# Patient Record
Sex: Female | Born: 1945 | Race: White | Hispanic: No | Smoking: Never smoker
Health system: Southern US, Community
[De-identification: ages and names within clinical notes are randomized; demographics above are authoritative.]

## PROBLEM LIST (undated history)

## (undated) DIAGNOSIS — M1990 Unspecified osteoarthritis, unspecified site: Secondary | ICD-10-CM

## (undated) DIAGNOSIS — F419 Anxiety disorder, unspecified: Secondary | ICD-10-CM

## (undated) DIAGNOSIS — Z8619 Personal history of other infectious and parasitic diseases: Secondary | ICD-10-CM

## (undated) DIAGNOSIS — Z8669 Personal history of other diseases of the nervous system and sense organs: Secondary | ICD-10-CM

## (undated) DIAGNOSIS — M810 Age-related osteoporosis without current pathological fracture: Secondary | ICD-10-CM

## (undated) DIAGNOSIS — A6920 Lyme disease, unspecified: Secondary | ICD-10-CM

## (undated) HISTORY — DX: Lyme disease, unspecified: A69.20

## (undated) HISTORY — PX: ABDOMINAL HYSTERECTOMY: SHX81

## (undated) HISTORY — DX: Personal history of other diseases of the nervous system and sense organs: Z86.69

## (undated) HISTORY — PX: APPENDECTOMY: SHX54

## (undated) HISTORY — DX: Personal history of other infectious and parasitic diseases: Z86.19

## (undated) HISTORY — PX: TONSILLECTOMY: SUR1361

## (undated) HISTORY — DX: Anxiety disorder, unspecified: F41.9

## (undated) HISTORY — DX: Unspecified osteoarthritis, unspecified site: M19.90

## (undated) HISTORY — DX: Age-related osteoporosis without current pathological fracture: M81.0

---

## 2015-01-15 DIAGNOSIS — M47816 Spondylosis without myelopathy or radiculopathy, lumbar region: Secondary | ICD-10-CM | POA: Insufficient documentation

## 2015-01-26 ENCOUNTER — Ambulatory Visit: Payer: Self-pay | Admitting: Family Medicine

## 2015-04-30 ENCOUNTER — Ambulatory Visit
Admission: RE | Admit: 2015-04-30 | Discharge: 2015-04-30 | Disposition: A | Discharge status: Home / Self Care | Payer: Medicare Other | Source: Ambulatory Visit | Attending: Gastroenterology | Admitting: Gastroenterology

## 2015-04-30 ENCOUNTER — Encounter
Admission: RE | Disposition: A | Discharge status: Home / Self Care | Payer: Self-pay | Source: Ambulatory Visit | Attending: Gastroenterology

## 2015-04-30 ENCOUNTER — Encounter: Payer: Self-pay | Admitting: *Deleted

## 2015-04-30 ENCOUNTER — Ambulatory Visit: Payer: Medicare Other | Admitting: Anesthesiology

## 2015-04-30 DIAGNOSIS — Z1211 Encounter for screening for malignant neoplasm of colon: Secondary | ICD-10-CM | POA: Diagnosis present

## 2015-04-30 DIAGNOSIS — D120 Benign neoplasm of cecum: Secondary | ICD-10-CM | POA: Diagnosis not present

## 2015-04-30 HISTORY — PX: COLONOSCOPY: SHX5424

## 2015-04-30 SURGERY — COLONOSCOPY
Anesthesia: General

## 2015-04-30 MED ORDER — MIDAZOLAM HCL 2 MG/2ML IJ SOLN
INTRAMUSCULAR | Status: DC | PRN
  Administered 2015-04-30: 1 mg via INTRAVENOUS

## 2015-04-30 MED ORDER — PROPOFOL INFUSION 10 MG/ML OPTIME
INTRAVENOUS | Status: DC | PRN
  Administered 2015-04-30: 100 ug/kg/min via INTRAVENOUS

## 2015-04-30 MED ORDER — PROPOFOL 10 MG/ML IV BOLUS
INTRAVENOUS | Status: DC | PRN
  Administered 2015-04-30: 20 mg via INTRAVENOUS

## 2015-04-30 MED ORDER — FENTANYL CITRATE (PF) 100 MCG/2ML IJ SOLN
INTRAMUSCULAR | Status: DC | PRN
  Administered 2015-04-30: 50 ug via INTRAVENOUS

## 2015-04-30 MED ORDER — SODIUM CHLORIDE 0.9 % IV SOLN
INTRAVENOUS | Status: DC
  Administered 2015-04-30: 12:00:00 via INTRAVENOUS
  Administered 2015-04-30: 1000 mL via INTRAVENOUS

## 2015-04-30 NOTE — Discharge Instructions (Signed)

## 2015-04-30 NOTE — Transfer of Care (Signed)
Immediate Anesthesia Transfer of Care Note  Patient: Evelyn Barker  Procedure(s) Performed: Procedure(s): COLONOSCOPY (N/A)  Patient Location: PACU  Anesthesia Type:General  Level of Consciousness: awake, alert  and oriented  Airway & Oxygen Therapy: Patient Spontanous Breathing and Patient connected to nasal cannula oxygen  Post-op Assessment: Report given to RN and Post -op Vital signs reviewed and stable  Post vital signs: Reviewed and stable  Last Vitals:  Filed Vitals:   04/30/15 1236  BP: 116/52  Pulse: 63  Temp: 36 C  Resp:     Complications: No apparent anesthesia complications

## 2015-04-30 NOTE — H&P (Signed)
  Primary Care Physician:  Maryland Pink, MD  Pre-Procedure History & Physical: HPI:  Evelyn Barker is a 69 y.o. female is here for a colonoscopy.   History reviewed. No pertinent past medical history.  Past Surgical History  Procedure Laterality Date  . Appendectomy    . Abdominal hysterectomy      Prior to Admission medications   Medication Sig Start Date End Date Taking? Authorizing Provider  aspirin 325 MG tablet Take 325 mg by mouth every 6 (six) hours as needed for headache.   Yes Historical Provider, MD  ibuprofen (ADVIL,MOTRIN) 200 MG tablet Take 200 mg by mouth 2 (two) times daily as needed for moderate pain.   Yes Historical Provider, MD  Multiple Vitamin (MULTIVITAMIN WITH MINERALS) TABS tablet Take 1 tablet by mouth every other day.   Yes Historical Provider, MD  multivitamin-lutein (OCUVITE-LUTEIN) CAPS capsule Take 1 capsule by mouth every other day.   Yes Historical Provider, MD    Allergies as of 03/25/2015  . (Not on File)    History reviewed. No pertinent family history.  History   Social History  . Marital Status: Widowed    Spouse Name: N/A  . Number of Children: N/A  . Years of Education: N/A   Occupational History  . Not on file.   Social History Main Topics  . Smoking status: Never Smoker   . Smokeless tobacco: Not on file  . Alcohol Use: Not on file  . Drug Use: Not on file  . Sexual Activity: Not on file   Other Topics Concern  . Not on file   Social History Narrative  . No narrative on file     Physical Exam: BP 122/95 mmHg  Pulse 70  Temp(Src) 96.9 F (36.1 C) (Tympanic)  Resp 16  Ht 5\' 5"  (1.651 m)  Wt 117 lb (53.071 kg)  BMI 19.47 kg/m2  SpO2 100% General:   Alert,  pleasant and cooperative in NAD Head:  Normocephalic and atraumatic. Neck:  Supple; no masses or thyromegaly. Lungs:  Clear throughout to auscultation.    Heart:  Regular rate and rhythm. Abdomen:  Soft, nontender and nondistended. Normal bowel sounds, without  guarding, and without rebound.   Neurologic:  Alert and  oriented x4;  grossly normal neurologically.  Impression/Plan: Evelyn Barker is here for an colonoscopy to be performed for colon cancer prevention  Risks, benefits, limitations, and alternatives regarding  colonoscopy have been reviewed with the patient.  Questions have been answered.  All parties agreeable.   Josefine Class, MD  04/30/2015, 11:53 AM

## 2015-04-30 NOTE — Op Note (Signed)
[facility] Patient Name: Evelyn Barker Procedure Date: 04/30/2015 11:25 AM MRN: [id number] Account #: [id number] Date of Birth: [birth year] Admit Type: Outpatient Age: 69 Room: [facility] ENDO ROOM 1 Gender: Female Note Status: Finalized Procedure:         Colonoscopy Indications:       Screening for colorectal malignant neoplasm, Last                     colonoscopy 15 years ago Patient Profile:   This is a 69 year old female. Providers:         Gerrit Heck. Rayann Heman, MD Referring MD:      Irven Easterly. Kary Kos, MD (Referring MD) Medicines:         Propofol per Anesthesia Complications:     No immediate complications. Procedure:         Pre-Anesthesia Assessment:                    - Prior to the procedure, a History and Physical was                     performed, and patient medications and allergies were                     reviewed. The patient is competent. The risks and benefits                     of the procedure and the sedation options and risks were                     discussed with the patient. All questions were answered                     and informed consent was obtained. Patient identification                     and proposed procedure were verified by the physician and                     the nurse in the pre-procedure area. Mental Status                     Examination: alert and oriented. Airway Examination:                     normal oropharyngeal airway and neck mobility. Respiratory                     Examination: clear to auscultation. CV Examination: RRR,                     no murmurs, no S3 or S4. Prophylactic Antibiotics: The                     patient does not require prophylactic antibiotics. Prior                     Anticoagulants: The patient has taken no previous                     anticoagulant or antiplatelet agents. ASA Grade                     Assessment: II - A patient with mild systemic disease.  After  reviewing the risks and benefits, the patient was                     deemed in satisfactory condition to undergo the procedure.                     The anesthesia plan was to use monitored anesthesia care                     (MAC). Immediately prior to administration of medications,                     the patient was re-assessed for adequacy to receive                     sedatives. The heart rate, respiratory rate, oxygen                     saturations, blood pressure, adequacy of pulmonary                     ventilation, and response to care were monitored                     throughout the procedure. The physical status of the                     patient was re-assessed after the procedure.                    - Prior to the procedure, a History and Physical was                     performed, and patient medications, allergies and                     sensitivities were reviewed. The patient's tolerance of                     previous anesthesia was reviewed.                    After obtaining informed consent, the colonoscope was                     passed under direct vision. Throughout the procedure, the                     patient's blood pressure, pulse, and oxygen saturations                     were monitored continuously. The Colonoscope was                     introduced through the anus and advanced to the the cecum,                     identified by appendiceal orifice and ileocecal valve. The                     patient tolerated the procedure well. The quality of the                     bowel preparation was excellent. The colonoscopy was  somewhat difficult due to significant looping. Successful                     completion of the procedure was aided by using manual                     pressure. Findings:      The perianal and digital rectal examinations were normal.      A 3 mm polyp was found in the cecum. The polyp was sessile. The polyp        was removed with a jumbo cold forceps. Resection and retrieval were       complete.      The exam was otherwise without abnormality on direct and retroflexion       views. Impression:        - One 3 mm polyp in the cecum. Resected and retrieved.                    - The examination was otherwise normal on direct and                     retroflexion views. Recommendation:    - Observe patient in GI recovery unit.                    - High fiber diet.                    - Continue present medications.                    - Await pathology results.                    - Repeat colonoscopy for surveillance based on pathology                     results.                    - Return to referring physician.                    - The findings and recommendations were discussed with the                     patient.                    - The findings and recommendations were discussed with the                     patient's family. Procedure Code(s): --- Professional ---                    [phone], Colonoscopy, flexible; with biopsy, single or                     multiple CPT copyright 2014 American Medical Association. All rights reserved. The codes documented in this report are preliminary and upon coder review may  be revised to meet current compliance requirements. Mellody Life, MD 04/30/2015 12:31:28 PM This report has been signed electronically. Number of Addenda: 0 Note Initiated On: 04/30/2015 11:25 AM Scope Withdrawal Time: 0 hours 1 minute 49 seconds  Total Procedure Duration: 0 hours 16 minutes 30 seconds       [facility]

## 2015-04-30 NOTE — Anesthesia Preprocedure Evaluation (Signed)
Anesthesia Evaluation  Patient identified by MRN, date of birth, ID band Patient awake    Reviewed: Allergy & Precautions, H&P , NPO status , Patient's Chart, lab work & pertinent test results, reviewed documented beta blocker date and time   Airway Mallampati: II  TM Distance: >3 FB Neck ROM: full    Dental no notable dental hx.    Pulmonary neg pulmonary ROS,  breath sounds clear to auscultation  Pulmonary exam normal       Cardiovascular Exercise Tolerance: Good negative cardio ROS  Rhythm:regular Rate:Normal     Neuro/Psych negative neurological ROS  negative psych ROS   GI/Hepatic negative GI ROS, Neg liver ROS,   Endo/Other  negative endocrine ROS  Renal/GU negative Renal ROS  negative genitourinary   Musculoskeletal   Abdominal   Peds  Hematology negative hematology ROS (+)   Anesthesia Other Findings   Reproductive/Obstetrics negative OB ROS                             Anesthesia Physical Anesthesia Plan  ASA: II  Anesthesia Plan: General   Post-op Pain Management:    Induction:   Airway Management Planned:   Additional Equipment:   Intra-op Plan:   Post-operative Plan:   Informed Consent: I have reviewed the patients History and Physical, chart, labs and discussed the procedure including the risks, benefits and alternatives for the proposed anesthesia with the patient or authorized representative who has indicated his/her understanding and acceptance.   Dental Advisory Given  Plan Discussed with: CRNA  Anesthesia Plan Comments:         Anesthesia Quick Evaluation

## 2015-04-30 NOTE — Anesthesia Postprocedure Evaluation (Signed)
  Anesthesia Post-op Note  Patient: Evelyn Barker  Procedure(s) Performed: Procedure(s): COLONOSCOPY (N/A)  Anesthesia type:General  Patient location: PACU  Post pain: Pain level controlled  Post assessment: Post-op Vital signs reviewed, Patient's Cardiovascular Status Stable, Respiratory Function Stable, Patent Airway and No signs of Nausea or vomiting  Post vital signs: Reviewed and stable  Last Vitals:  Filed Vitals:   04/30/15 1320  BP: 138/62  Pulse:   Temp:   Resp:     Level of consciousness: awake, alert  and patient cooperative  Complications: No apparent anesthesia complications

## 2015-05-03 LAB — SURGICAL PATHOLOGY

## 2015-05-04 ENCOUNTER — Encounter: Payer: Self-pay | Admitting: Gastroenterology

## 2015-12-10 ENCOUNTER — Other Ambulatory Visit: Payer: Self-pay | Admitting: Adult Health

## 2015-12-10 DIAGNOSIS — N644 Mastodynia: Secondary | ICD-10-CM

## 2015-12-10 DIAGNOSIS — Z1231 Encounter for screening mammogram for malignant neoplasm of breast: Secondary | ICD-10-CM

## 2015-12-21 ENCOUNTER — Ambulatory Visit
Admission: RE | Admit: 2015-12-21 | Discharge: 2015-12-21 | Disposition: A | Discharge status: Home / Self Care | Payer: Medicare Other | Source: Ambulatory Visit | Attending: Adult Health | Admitting: Adult Health

## 2015-12-21 DIAGNOSIS — N644 Mastodynia: Secondary | ICD-10-CM

## 2016-10-22 DIAGNOSIS — F419 Anxiety disorder, unspecified: Secondary | ICD-10-CM | POA: Insufficient documentation

## 2017-01-12 ENCOUNTER — Other Ambulatory Visit: Payer: Self-pay | Admitting: Internal Medicine

## 2017-01-12 DIAGNOSIS — Z1231 Encounter for screening mammogram for malignant neoplasm of breast: Secondary | ICD-10-CM

## 2017-01-18 ENCOUNTER — Ambulatory Visit: Payer: Medicare Other

## 2017-02-16 ENCOUNTER — Ambulatory Visit
Admission: RE | Admit: 2017-02-16 | Discharge: 2017-02-16 | Disposition: A | Discharge status: Home / Self Care | Payer: Medicare Other | Source: Ambulatory Visit | Attending: Internal Medicine | Admitting: Internal Medicine

## 2017-02-16 DIAGNOSIS — Z1231 Encounter for screening mammogram for malignant neoplasm of breast: Secondary | ICD-10-CM | POA: Diagnosis not present

## 2017-07-18 DIAGNOSIS — G43101 Migraine with aura, not intractable, with status migrainosus: Secondary | ICD-10-CM | POA: Diagnosis not present

## 2017-10-04 DIAGNOSIS — Z23 Encounter for immunization: Secondary | ICD-10-CM | POA: Diagnosis not present

## 2017-10-26 DIAGNOSIS — M25561 Pain in right knee: Secondary | ICD-10-CM | POA: Diagnosis not present

## 2017-10-26 DIAGNOSIS — M810 Age-related osteoporosis without current pathological fracture: Secondary | ICD-10-CM | POA: Diagnosis not present

## 2017-10-26 DIAGNOSIS — M179 Osteoarthritis of knee, unspecified: Secondary | ICD-10-CM | POA: Diagnosis not present

## 2017-10-26 DIAGNOSIS — M25551 Pain in right hip: Secondary | ICD-10-CM | POA: Diagnosis not present

## 2017-11-14 DIAGNOSIS — M810 Age-related osteoporosis without current pathological fracture: Secondary | ICD-10-CM | POA: Diagnosis not present

## 2017-12-12 DIAGNOSIS — D485 Neoplasm of uncertain behavior of skin: Secondary | ICD-10-CM | POA: Diagnosis not present

## 2017-12-12 DIAGNOSIS — L718 Other rosacea: Secondary | ICD-10-CM | POA: Diagnosis not present

## 2017-12-12 DIAGNOSIS — D229 Melanocytic nevi, unspecified: Secondary | ICD-10-CM | POA: Diagnosis not present

## 2017-12-12 DIAGNOSIS — Z1283 Encounter for screening for malignant neoplasm of skin: Secondary | ICD-10-CM | POA: Diagnosis not present

## 2017-12-12 DIAGNOSIS — I781 Nevus, non-neoplastic: Secondary | ICD-10-CM | POA: Diagnosis not present

## 2017-12-12 DIAGNOSIS — L570 Actinic keratosis: Secondary | ICD-10-CM | POA: Diagnosis not present

## 2017-12-12 DIAGNOSIS — L720 Epidermal cyst: Secondary | ICD-10-CM | POA: Diagnosis not present

## 2017-12-12 DIAGNOSIS — L821 Other seborrheic keratosis: Secondary | ICD-10-CM | POA: Diagnosis not present

## 2017-12-12 DIAGNOSIS — D1800 Hemangioma unspecified site: Secondary | ICD-10-CM | POA: Diagnosis not present

## 2017-12-12 DIAGNOSIS — D2239 Melanocytic nevi of other parts of face: Secondary | ICD-10-CM | POA: Diagnosis not present

## 2017-12-12 DIAGNOSIS — Z85828 Personal history of other malignant neoplasm of skin: Secondary | ICD-10-CM | POA: Diagnosis not present

## 2017-12-12 DIAGNOSIS — L739 Follicular disorder, unspecified: Secondary | ICD-10-CM | POA: Diagnosis not present

## 2017-12-12 DIAGNOSIS — I8393 Asymptomatic varicose veins of bilateral lower extremities: Secondary | ICD-10-CM | POA: Diagnosis not present

## 2018-01-07 DIAGNOSIS — D485 Neoplasm of uncertain behavior of skin: Secondary | ICD-10-CM | POA: Diagnosis not present

## 2018-01-07 DIAGNOSIS — D210 Benign neoplasm of connective and other soft tissue of head, face and neck: Secondary | ICD-10-CM | POA: Diagnosis not present

## 2018-01-14 ENCOUNTER — Other Ambulatory Visit: Payer: Self-pay | Admitting: Internal Medicine

## 2018-01-14 DIAGNOSIS — D239 Other benign neoplasm of skin, unspecified: Secondary | ICD-10-CM | POA: Diagnosis not present

## 2018-01-14 DIAGNOSIS — Z1231 Encounter for screening mammogram for malignant neoplasm of breast: Secondary | ICD-10-CM

## 2018-02-19 ENCOUNTER — Ambulatory Visit
Admission: RE | Admit: 2018-02-19 | Discharge: 2018-02-19 | Disposition: A | Discharge status: Home / Self Care | Payer: Medicare Other | Source: Ambulatory Visit | Attending: Internal Medicine | Admitting: Internal Medicine

## 2018-02-19 DIAGNOSIS — Z1231 Encounter for screening mammogram for malignant neoplasm of breast: Secondary | ICD-10-CM | POA: Diagnosis not present

## 2018-09-26 DIAGNOSIS — Z23 Encounter for immunization: Secondary | ICD-10-CM | POA: Diagnosis not present

## 2018-10-16 ENCOUNTER — Ambulatory Visit (INDEPENDENT_AMBULATORY_CARE_PROVIDER_SITE_OTHER): Payer: Medicare Other | Admitting: Internal Medicine

## 2018-10-16 ENCOUNTER — Encounter: Payer: Self-pay | Admitting: Internal Medicine

## 2018-10-16 VITALS — BP 118/70 | HR 77 | Temp 97.5°F | Ht 65.0 in | Wt 106.8 lb

## 2018-10-16 DIAGNOSIS — Z1389 Encounter for screening for other disorder: Secondary | ICD-10-CM

## 2018-10-16 DIAGNOSIS — Z13220 Encounter for screening for lipoid disorders: Secondary | ICD-10-CM | POA: Diagnosis not present

## 2018-10-16 DIAGNOSIS — M810 Age-related osteoporosis without current pathological fracture: Secondary | ICD-10-CM | POA: Diagnosis not present

## 2018-10-16 DIAGNOSIS — M25562 Pain in left knee: Secondary | ICD-10-CM | POA: Diagnosis not present

## 2018-10-16 DIAGNOSIS — L299 Pruritus, unspecified: Secondary | ICD-10-CM

## 2018-10-16 DIAGNOSIS — R634 Abnormal weight loss: Secondary | ICD-10-CM

## 2018-10-16 DIAGNOSIS — E559 Vitamin D deficiency, unspecified: Secondary | ICD-10-CM | POA: Diagnosis not present

## 2018-10-16 DIAGNOSIS — Z1231 Encounter for screening mammogram for malignant neoplasm of breast: Secondary | ICD-10-CM

## 2018-10-16 DIAGNOSIS — M25551 Pain in right hip: Secondary | ICD-10-CM

## 2018-10-16 DIAGNOSIS — Z0000 Encounter for general adult medical examination without abnormal findings: Secondary | ICD-10-CM | POA: Diagnosis not present

## 2018-10-16 DIAGNOSIS — M25569 Pain in unspecified knee: Secondary | ICD-10-CM | POA: Insufficient documentation

## 2018-10-16 DIAGNOSIS — G8929 Other chronic pain: Secondary | ICD-10-CM

## 2018-10-16 DIAGNOSIS — E2839 Other primary ovarian failure: Secondary | ICD-10-CM

## 2018-10-16 DIAGNOSIS — M25561 Pain in right knee: Secondary | ICD-10-CM | POA: Diagnosis not present

## 2018-10-16 LAB — CBC WITH DIFFERENTIAL/PLATELET
BASOS PCT: 0.3 % (ref 0.0–3.0)
Basophils Absolute: 0 10*3/uL (ref 0.0–0.1)
EOS ABS: 0.1 10*3/uL (ref 0.0–0.7)
EOS PCT: 1.2 % (ref 0.0–5.0)
HCT: 44.8 % (ref 36.0–46.0)
HEMOGLOBIN: 15.3 g/dL — AB (ref 12.0–15.0)
Lymphocytes Relative: 25.8 % (ref 12.0–46.0)
Lymphs Abs: 1.3 10*3/uL (ref 0.7–4.0)
MCHC: 34.1 g/dL (ref 30.0–36.0)
MCV: 95.6 fl (ref 78.0–100.0)
MONO ABS: 0.4 10*3/uL (ref 0.1–1.0)
Monocytes Relative: 8.4 % (ref 3.0–12.0)
Neutro Abs: 3.2 10*3/uL (ref 1.4–7.7)
Neutrophils Relative %: 64.3 % (ref 43.0–77.0)
Platelets: 255 10*3/uL (ref 150.0–400.0)
RBC: 4.69 Mil/uL (ref 3.87–5.11)
RDW: 12.6 % (ref 11.5–15.5)
WBC: 5 10*3/uL (ref 4.0–10.5)

## 2018-10-16 LAB — COMPREHENSIVE METABOLIC PANEL
ALBUMIN: 4.5 g/dL (ref 3.5–5.2)
ALT: 12 U/L (ref 0–35)
AST: 20 U/L (ref 0–37)
Alkaline Phosphatase: 65 U/L (ref 39–117)
BUN: 15 mg/dL (ref 6–23)
CALCIUM: 9.6 mg/dL (ref 8.4–10.5)
CHLORIDE: 105 meq/L (ref 96–112)
CO2: 28 meq/L (ref 19–32)
Creatinine, Ser: 0.83 mg/dL (ref 0.40–1.20)
GFR: 71.54 mL/min (ref >60.00)
Glucose, Bld: 103 mg/dL — ABNORMAL HIGH (ref 70–99)
POTASSIUM: 4.1 meq/L (ref 3.5–5.1)
Sodium: 140 mEq/L (ref 135–145)
Total Bilirubin: 1.8 mg/dL — ABNORMAL HIGH (ref 0.2–1.2)
Total Protein: 6.5 g/dL (ref 6.0–8.3)

## 2018-10-16 LAB — URINALYSIS, ROUTINE W REFLEX MICROSCOPIC
BILIRUBIN URINE: NEGATIVE
HGB URINE DIPSTICK: NEGATIVE
KETONES UR: NEGATIVE
LEUKOCYTES UA: NEGATIVE
NITRITE: NEGATIVE
PH: 5.5 (ref 5.0–8.0)
Specific Gravity, Urine: >=1.03 — AB (ref 1.000–1.030)
Urine Glucose: NEGATIVE
Urobilinogen, UA: 0.2 (ref 0.0–1.0)

## 2018-10-16 LAB — LIPID PANEL
CHOLESTEROL: 188 mg/dL (ref 0–200)
HDL: 65.7 mg/dL (ref >39.00)
LDL CALC: 109 mg/dL — AB (ref 0–99)
NonHDL: 122.7
TRIGLYCERIDES: 69 mg/dL (ref 0.0–149.0)
Total CHOL/HDL Ratio: 3
VLDL: 13.8 mg/dL (ref 0.0–40.0)

## 2018-10-16 LAB — T4, FREE: Free T4: 0.82 ng/dL (ref 0.60–1.60)

## 2018-10-16 LAB — VITAMIN D 25 HYDROXY (VIT D DEFICIENCY, FRACTURES): VITD: 31.8 ng/mL (ref 30.00–100.00)

## 2018-10-16 LAB — TSH: TSH: 3.29 u[IU]/mL (ref 0.35–4.50)

## 2018-10-16 MED ORDER — TRIAMCINOLONE ACETONIDE 0.1 % EX CREA: 1.0000 "application " | TOPICAL_CREAM | Freq: Two times a day (BID) | CUTANEOUS | 0 refills | Status: DC

## 2018-10-16 NOTE — Progress Notes (Signed)
Chief Complaint  Patient presents with  . Establish Care   New patient  1. H/o osteoporosis did not take fosamax +11/2017 noted no h/o fracture FH possibly osteoporosis with + h/o fracture   2. H/o arthritis  H/o b/l knee pain now left >R x 2 months 6/10 and right 4/10 intermittent relieved by otc nsaid ibuprofen helps per pt possibly allergic to Tylenol but unaware of allergy or if this cross reacts with other allergies  Hip pain, right pain is intermittent and mild at times.   3. Weight loss used to be 120-130s then 110 per pt weight is stable at 106. Appetite normal no night sweats or fever.   4. C/o right upper back itching at times w/o rash and uses otc hc w/o relief she thinks bras bother her    Review of Systems  Constitutional: Positive for weight loss.  HENT: Negative for hearing loss.   Eyes: Negative for blurred vision.  Respiratory: Negative for shortness of breath.   Cardiovascular: Negative for chest pain.  Gastrointestinal: Negative for abdominal pain.  Musculoskeletal: Positive for joint pain. Negative for falls.  Skin: Negative for rash.  Neurological: Negative for headaches.  Psychiatric/Behavioral: Negative for depression.   Past Medical History:  Diagnosis Date  . Anxiety    knees, low back   . Arthritis   . History of chicken pox   . History of macular degeneration   . History of shingles    x3   . Osteoporosis   . 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 ABDOMINAL HYSTERECTOMY     DUB, endometriosis no h/o abnormal pap   . APPENDECTOMY    . COLONOSCOPY N/A 04/30/2015   Procedure: COLONOSCOPY;  Surgeon: Josefine Class, MD;  Location: [facility] ENDOSCOPY;  Service: Endoscopy;  Laterality: N/A;  . TONSILLECTOMY     Family History  Problem Relation Age of Onset  . Stroke Mother   . Cancer Father        prostate   . Pneumonia Maternal Grandmother   . Hip fracture Maternal  Grandmother   . Stroke Paternal Grandfather   . Skin cancer Other   . Breast cancer Neg Hx    Social History   Socioeconomic History  . Marital status: Widowed    Spouse name: Not on file  . Number of children: Not on file  . Years of education: Not on file  . Highest education level: Not on file  Occupational History  . Not on file  Social Needs  . Financial resource strain: Not on file  . Food insecurity:    Worry: Not on file    Inability: Not on file  . Transportation needs:    Medical: Not on file    Non-medical: Not on file  Tobacco Use  . Smoking status: Never Smoker  Substance and Sexual Activity  . Alcohol use: Not on file  . Drug use: Not on file  . Sexual activity: Not on file  Lifestyle  . Physical activity:    Days per week: Not on file    Minutes per session: Not on file  . Stress: Not on file  Relationships  . Social connections:    Talks on phone: Not on file    Gets together: Not on file    Attends religious service: Not on file    Active member of club or organization: Not on file  Attends meetings of clubs or organizations: Not on file    Relationship status: Not on file  . Intimate partner violence:    Fear of current or ex partner: Not on file    Emotionally abused: Not on file    Physically abused: Not on file    Forced sexual activity: Not on file  Other Topics Concern  . 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 aspirin 325 MG tablet Take 325 mg by mouth every 6 (six) hours as needed for headache.  . Glucosamine-Chondroitin (GLUCOSAMINE CHONDR COMPLEX PO) Take by mouth.  Marland Kitchen ibuprofen (ADVIL,MOTRIN) 200 MG tablet Take  200 mg by mouth 2 (two) times daily as needed for moderate pain.  . Multiple Vitamin (MULTIVITAMIN WITH MINERALS) TABS tablet Take 1 tablet by mouth every other day.  . multivitamin-lutein (OCUVITE-LUTEIN) CAPS capsule Take 1 capsule by mouth every other day.  . [DISCONTINUED] multivitamin-lutein (OCUVITE-LUTEIN) CAPS capsule Take 1 capsule by mouth daily.   Allergies  Allergen Reactions  . Amoxil [Amoxicillin]   . Tylenol [Acetaminophen]     ?reaction    . Ampicillin Rash  . Keflex [Cephalexin] Rash  . Macrobid [Nitrofurantoin] Rash  . Ofloxacin Rash  . Penicillins Rash  . Sulfur Rash   No results found for this or any previous visit (from the past 2160 hour(s)). Objective  Body mass index is 17.77 kg/m. Wt Readings from Last 3 Encounters:  10/16/18 106 lb 12.8 oz (48.4 kg)  04/30/15 117 lb (53.1 kg)   Temp Readings from Last 3 Encounters:  10/16/18 (!) 97.5 F (36.4 C) (Oral)  04/30/15 (!) 96.4 F (35.8 C) (Tympanic)   BP Readings from Last 3 Encounters:  10/16/18 118/70  04/30/15 138/62   Pulse Readings from Last 3 Encounters:  10/16/18 77  04/30/15 63  04/30/15 (!) 57    Physical Exam  Constitutional: She is oriented to person, place, and time. Vital signs are normal. She appears well-developed and well-nourished. She is cooperative.  HENT:  Head: Normocephalic and atraumatic.  Mouth/Throat: Oropharynx is clear and moist and mucous membranes are normal.  Eyes: Pupils are equal, round, and reactive to light. Conjunctivae are normal.  Cardiovascular: Normal rate, regular rhythm and normal heart sounds.  Pulmonary/Chest: Effort normal and breath sounds normal.  Neurological: She is alert and oriented to person, place, and time. Gait normal.  Skin: Skin is warm, dry and intact.  Psychiatric: She has a normal mood and affect. Her speech is normal and behavior is normal. Judgment and thought content normal. Cognition and memory are normal.  Nursing note and vitals  reviewed.   Assessment   1. H/o osteoporosis  2. H/o b/l knee pain now left >R x 2 months 6/10 and right 4/10 intermittent relieved by otc nsaid ibuprofen  Hip pain  3. Weight loss used to be 120-130s then 110 per pt weight is stable at 106  Appetite normal no night sweats or fever.  4. HM 5. No rash right RUB could be notalgia peristhetica  vs irritation from elastic bras  Plan   1. Ordered repeat dexa consider prolia never took fosamax  2. Consider Xrays in future if continues to bother  Prn otc nsaid sparingly  3. Labs today  4.   Fasting labs today declines hep C Consider MMR in future  Flu shot had 10/06/18 [place]  pna 23 had 08/10/10, rec prevnar  Given Rx shingrix  Tdap had 10/11/12  zostervax had   Never smoker  No need for pap s/p hysterectomy DUB, endometriosis  Mammogram due 02/20/2019 order in  dexa had 11/15/2017 +osteoporosis -2.9 left femur will repeat  Pt never took fosamax disc prolia  Colonoscopy h/o polyp tubular 05/03/15 repeat in 5 years 2021 fobt neg 11/03/12  Of note pt has 2 DPRs and would like [place] to be her local emergency contact  5. Trial of TMC right upper back   [facility] macular deg resolved per pt due to see AE 12/2018  Dr. Pryor Ochoa  Dr. Wilkie Aye dentist Provider: Dr. Olivia Mackie McLean-Scocuzza-Internal Medicine

## 2018-10-16 NOTE — Progress Notes (Signed)
Pre visit review using our clinic review tool, if applicable. No additional management support is needed unless otherwise documented below in the visit note. 

## 2018-10-16 NOTE — Patient Instructions (Addendum)
Premier protein shake   Cetaphil or cerave cream     Osteoporosis Osteoporosis is the thinning and loss of density in the bones. Osteoporosis makes the bones more brittle, fragile, and likely to break (fracture). Over time, osteoporosis can cause the bones to become so weak that they fracture after a simple fall. The bones most likely to fracture are the bones in the hip, wrist, and spine. What are the causes? The exact cause is not known. What increases the risk? Anyone can develop osteoporosis. You may be at greater risk if you have a family history of the condition or have poor nutrition. You may also have a higher risk if you are:  Female.  73 years old or older.  A smoker.  Not physically active.  White or Asian.  Slender.  What are the signs or symptoms? A fracture might be the first sign of the disease, especially if it results from a fall or injury that would not usually cause a bone to break. Other signs and symptoms include:  Low back and neck pain.  Stooped posture.  Height loss.  How is this diagnosed? To make a diagnosis, your health care provider may:  Take a medical history.  Perform a physical exam.  Order tests, such as: ? A bone mineral density test. ? A dual-energy X-ray absorptiometry test.  How is this treated? The goal of osteoporosis treatment is to strengthen your bones to reduce your risk of a fracture. Treatment may involve:  Making lifestyle changes, such as: ? Eating a diet rich in calcium. ? Doing weight-bearing and muscle-strengthening exercises. ? Stopping tobacco use. ? Limiting alcohol intake.  Taking medicine to slow the process of bone loss or to increase bone density.  Monitoring your levels of calcium and vitamin D.  Follow these instructions at home:  Include calcium and vitamin D in your diet. Calcium is important for bone health, and vitamin D helps the body absorb calcium.  Perform weight-bearing and  muscle-strengthening exercises as directed by your health care provider.  Do not use any tobacco products, including cigarettes, chewing tobacco, and electronic cigarettes. If you need help quitting, ask your health care provider.  Limit your alcohol intake.  Take medicines only as directed by your health care provider.  Keep all follow-up visits as directed by your health care provider. This is important.  Take precautions at home to lower your risk of falling, such as: ? Keeping rooms well lit and clutter free. ? Installing safety rails on stairs. ? Using rubber mats in the bathroom and other areas that are often wet or slippery. Get help right away if: You fall or injure yourself. This information is not intended to replace advice given to you by your health care provider. Make sure you discuss any questions you have with your health care provider. Document Released: 09/06/2005 Document Revised: 05/01/2016 Document Reviewed: 05/07/2014 Elsevier Interactive Patient Education  2018 [place] Zoster (Shingles) Vaccine, RZV: What You Need to Know 1. Why get vaccinated? Shingles (also called herpes zoster, or just zoster) is a painful skin rash, often with blisters. Shingles is caused by the varicella zoster virus, the same virus that causes chickenpox. After you have chickenpox, the virus stays in your body and can cause shingles later in life. You can't catch shingles from another person. However, a person who has never had chickenpox (or chickenpox vaccine) could get chickenpox from someone with shingles. A shingles rash usually appears on one side of  the face or body and heals within 2 to 4 weeks. Its main symptom is pain, which can be severe. Other symptoms can include fever, headache, chills and upset stomach. Very rarely, a shingles infection can lead to pneumonia, hearing problems, blindness, brain inflammation (encephalitis), or death. For about 1 person in 5, severe  pain can continue even long after the rash has cleared up. This long-lasting pain is called post-herpetic neuralgia (PHN). Shingles is far more common in people 10 years of age and older than in younger people, and the risk increases with age. It is also more common in people whose immune system is weakened because of a disease such as cancer, or by drugs such as steroids or chemotherapy. At least 1 million people a year in the [place] get shingles. 2. Shingles vaccine (recombinant) Recombinant shingles vaccine was approved by FDA in 2017 for the prevention of shingles. In clinical trials, it was more than 90% effective in preventing shingles. It can also reduce the likelihood of PHN. Two doses, 2 to 6 months apart, are recommended for adults 11 and older. This vaccine is also recommended for people who have already gotten the live shingles vaccine (Zostavax). There is no live virus in this vaccine. 3. Some people should not get this vaccine Tell your vaccine provider if you:  Have any severe, life-threatening allergies. A person who has ever had a life-threatening allergic reaction after a dose of recombinant shingles vaccine, or has a severe allergy to any component of this vaccine, may be advised not to be vaccinated. Ask your health care provider if you want information about vaccine components.  Are pregnant or breastfeeding. There is not much information about use of recombinant shingles vaccine in pregnant or nursing women. Your healthcare provider might recommend delaying vaccination.  Are not feeling well. If you have a mild illness, such as a cold, you can probably get the vaccine today. If you are moderately or severely ill, you should probably wait until you recover. Your doctor can advise you.  4. Risks of a vaccine reaction With any medicine, including vaccines, there is a chance of reactions. After recombinant shingles vaccination, a person might experience:  Pain, redness,  soreness, or swelling at the site of the injection  Headache, muscle aches, fever, shivering, fatigue  In clinical trials, most people got a sore arm with mild or moderate pain after vaccination, and some also had redness and swelling where they got the shot. Some people felt tired, had muscle pain, a headache, shivering, fever, stomach pain, or nausea. About 1 out of 6 people who got recombinant zoster vaccine experienced side effects that prevented them from doing regular activities. Symptoms went away on their own in about 2 to 3 days. Side effects were more common in younger people. You should still get the second dose of recombinant zoster vaccine even if you had one of these reactions after the first dose. Other things that could happen after this vaccine:  People sometimes faint after medical procedures, including vaccination. Sitting or lying down for about 15 minutes can help prevent fainting and injuries caused by a fall. Tell your provider if you feel dizzy or have vision changes or ringing in the ears.  Some people get shoulder pain that can be more severe and longer-lasting than routine soreness that can follow injections. This happens very rarely.  Any medication can cause a severe allergic reaction. Such reactions to a vaccine are estimated at about 1 in a million  doses, and would happen within a few minutes to a few hours after the vaccination. As with any medicine, there is a very remote chance of a vaccine causing a serious injury or death. The safety of vaccines is always being monitored. For more information, visit: http://www.aguilar.org/ 5. What if there is a serious problem? What should I look for?  Look for anything that concerns you, such as signs of a severe allergic reaction, very high fever, or unusual behavior. Signs of a severe allergic reaction can include hives, swelling of the face and throat, difficulty breathing, a fast heartbeat, dizziness, and weakness.  These would usually start a few minutes to a few hours after the vaccination. What should I do?  If you think it is a severe allergic reaction or other emergency that can't wait, call 9-1-1 and get to the nearest hospital. Otherwise, call your health care provider. Afterward, the reaction should be reported to the Vaccine Adverse Event Reporting System (VAERS). Your doctor should file this report, or you can do it yourself through the VAERS web site atwww.vaers.https://www.bray.com/ by calling 678-837-9115. VAERS does not give medical advice. 6. How can I learn more?  Ask your healthcare provider. He or she can give you the vaccine package insert or suggest other sources of information.  Call your local or state health department.  Contact the Centers for Disease Control and Prevention (CDC): ? Call (240) 625-2475 (1-800-CDC-INFO) or ? Visit the CDC's website at http://hunter.com/ CDC Vaccine Information Statement (VIS) Recombinant Zoster Vaccine (01/22/2017) This information is not intended to replace advice given to you by your health care provider. Make sure you discuss any questions you have with your health care provider. Document Released: 02/06/2017 Document Revised: 02/06/2017 Document Reviewed: 02/06/2017 Elsevier Interactive Patient Education  2018 [employer].  Pneumococcal Conjugate Vaccine suspension for injection What is this medicine? PNEUMOCOCCAL VACCINE (NEU mo KOK al vak SEEN) is a vaccine used to prevent pneumococcus bacterial infections. These bacteria can cause serious infections like pneumonia, meningitis, and blood infections. This vaccine will lower your chance of getting pneumonia. If you do get pneumonia, it can make your symptoms milder and your illness shorter. This vaccine will not treat an infection and will not cause infection. This vaccine is recommended for infants and young children, adults with certain medical conditions, and adults 48 years or older. This medicine  may be used for other purposes; ask your health care provider or pharmacist if you have questions. COMMON BRAND NAME(S): Prevnar, Prevnar 13 What should I tell my health care provider before I take this medicine? They need to know if you have any of these conditions: -bleeding problems -fever -immune system problems -an unusual or allergic reaction to pneumococcal vaccine, diphtheria toxoid, other vaccines, latex, other medicines, foods, dyes, or preservatives -pregnant or trying to get pregnant -breast-feeding How should I use this medicine? This vaccine is for injection into a muscle. It is given by a health care professional. A copy of Vaccine Information Statements will be given before each vaccination. Read this sheet carefully each time. The sheet may change frequently. Talk to your pediatrician regarding the use of this medicine in children. While this drug may be prescribed for children as young as 14 weeks old for selected conditions, precautions do apply. Overdosage: If you think you have taken too much of this medicine contact a poison control center or emergency room at once. NOTE: This medicine is only for you. Do not share this medicine with others. What if I  miss a dose? It is important not to miss your dose. Call your doctor or health care professional if you are unable to keep an appointment. What may interact with this medicine? -medicines for cancer chemotherapy -medicines that suppress your immune function -steroid medicines like prednisone or cortisone This list may not describe all possible interactions. Give your health care provider a list of all the medicines, herbs, non-prescription drugs, or dietary supplements you use. Also tell them if you smoke, drink alcohol, or use illegal drugs. Some items may interact with your medicine. What should I watch for while using this medicine? Mild fever and pain should go away in 3 days or less. Report any unusual symptoms to your  doctor or health care professional. What side effects may I notice from receiving this medicine? Side effects that you should report to your doctor or health care professional as soon as possible: -allergic reactions like skin rash, itching or hives, swelling of the face, lips, or tongue -breathing problems -confused -fast or irregular heartbeat -fever over 102 degrees F -seizures -unusual bleeding or bruising -unusual muscle weakness Side effects that usually do not require medical attention (report to your doctor or health care professional if they continue or are bothersome): -aches and pains -diarrhea -fever of 102 degrees F or less -headache -irritable -loss of appetite -pain, tender at site where injected -trouble sleeping This list may not describe all possible side effects. Call your doctor for medical advice about side effects. You may report side effects to FDA at 1-800-FDA-1088. Where should I keep my medicine? This does not apply. This vaccine is given in a clinic, pharmacy, doctor's office, or other health care setting and will not be stored at home. NOTE: This sheet is a summary. It may not cover all possible information. If you have questions about this medicine, talk to your doctor, pharmacist, or health care provider.  2018 Elsevier/Gold Standard (2014-09-03 10:27:27)  Tdap/DTaP Vaccine (Diphtheria, Tetanus, and Pertussis): What You Need to Know 1. Why get vaccinated? Diphtheria, tetanus, and pertussis are serious diseases caused by bacteria. Diphtheria and pertussis are spread from person to person. Tetanus enters the body through cuts or wounds. DIPHTHERIA causes a thick covering in the back of the throat.  It can lead to breathing problems, paralysis, heart failure, and even death.  TETANUS (Lockjaw) causes painful tightening of the muscles, usually all over the body.  It can lead to "locking" of the jaw so the victim cannot open his mouth or swallow. Tetanus  leads to death in up to 2 out of 10 cases.  PERTUSSIS (Whooping Cough) causes coughing spells so bad that it is hard for infants to eat, drink, or breathe. These spells can last for weeks.  It can lead to pneumonia, seizures (jerking and staring spells), brain damage, and death.  Diphtheria, tetanus, and pertussis vaccine (DTaP) can help prevent these diseases. Most children who are vaccinated with DTaP will be protected throughout childhood. Many more children would get these diseases if we stopped vaccinating. DTaP is a safer version of an older vaccine called DTP. DTP is no longer used in the [place]. 2. Who should get DTaP vaccine and when? Children should get 5 doses of DTaP vaccine, one dose at each of the following ages:  2 months  4 months  6 months  15-18 months  4-6 years  DTaP may be given at the same time as other vaccines. 3. Some children should not get DTaP vaccine or should wait  Children with minor illnesses, such as a cold, may be vaccinated. But children who are moderately or severely ill should usually wait until they recover before getting DTaP vaccine.  Any child who had a life-threatening allergic reaction after a dose of DTaP should not get another dose.  Any child who suffered a brain or nervous system disease within 7 days after a dose of DTaP should not get another dose.  Talk with your doctor if your child: ? had a seizure or collapsed after a dose of DTaP, ? cried non-stop for 3 hours or more after a dose of DTaP, ? had a fever over 105F after a dose of DTaP. Ask your doctor for more information. Some of these children should not get another dose of pertussis vaccine, but may get a vaccine without pertussis, called DT. 4. Older children and adults DTaP is not licensed for adolescents, adults, or children 25 years of age and older. But older people still need protection. A vaccine called Tdap is similar to DTaP. A single dose of Tdap is  recommended for people 11 through 73 years of age. Another vaccine, called Td, protects against tetanus and diphtheria, but not pertussis. It is recommended every 10 years. There are separate Vaccine Information Statements for these vaccines. 5. What are the risks from DTaP vaccine? Getting diphtheria, tetanus, or pertussis disease is much riskier than getting DTaP vaccine. However, a vaccine, like any medicine, is capable of causing serious problems, such as severe allergic reactions. The risk of DTaP vaccine causing serious harm, or death, is extremely small. Mild problems (common)  Fever (up to about 1 child in 4)  Redness or swelling where the shot was given (up to about 1 child in 4)  Soreness or tenderness where the shot was given (up to about 1 child in 4) These problems occur more often after the 4th and 5th doses of the DTaP series than after earlier doses. Sometimes the 4th or 5th dose of DTaP vaccine is followed by swelling of the entire arm or leg in which the shot was given, lasting 1-7 days (up to about 1 child in 86). Other mild problems include:  Fussiness (up to about 1 child in 3)  Tiredness or poor appetite (up to about 1 child in 10)  Vomiting (up to about 1 child in 34) These problems generally occur 1-3 days after the shot. Moderate problems (uncommon)  Seizure (jerking or staring) (about 1 child out of 14,000)  Non-stop crying, for 3 hours or more (up to about 1 child out of 1,000)  High fever, over 105F (about 1 child out of 16,000) Severe problems (very rare)  Serious allergic reaction (less than 1 out of a million doses)  Several other severe problems have been reported after DTaP vaccine. These include: ? Long-term seizures, coma, or lowered consciousness ? Permanent brain damage. These are so rare it is hard to tell if they are caused by the vaccine. Controlling fever is especially important for children who have had seizures, for any reason. It is also  important if another family member has had seizures. You can reduce fever and pain by giving your child an aspirin-free pain reliever when the shot is given, and for the next 24 hours, following the package instructions. 6. What if there is a serious reaction? What should I look for? Look for anything that concerns you, such as signs of a severe allergic reaction, very high fever, or behavior changes. Signs of a severe  allergic reaction can include hives, swelling of the face and throat, difficulty breathing, a fast heartbeat, dizziness, and weakness. These would start a few minutes to a few hours after the vaccination. What should I do?  If you think it is a severe allergic reaction or other emergency that can't wait, call 9-1-1 or get the person to the nearest hospital. Otherwise, call your doctor.  Afterward, the reaction should be reported to the Vaccine Adverse Event Reporting System (VAERS). Your doctor might file this report, or you can do it yourself through the VAERS web site at www.vaers.SamedayNews.es, or by calling 941-650-9108. ? VAERS is only for reporting reactions. They do not give medical advice. 7. The National Vaccine Injury Compensation Program The [employer] Vaccine Injury Compensation Program (VICP) is a federal program that was created to compensate people who may have been injured by certain vaccines. Persons who believe they may have been injured by a vaccine can learn about the program and about filing a claim by calling 445-311-5357 or visiting the [place] website at GoldCloset.com.ee. 8. How can I learn more?  Ask your doctor.  Call your local or state health department.  Contact the Centers for Disease Control and Prevention (CDC): ? Call (743) 532-4432 (1-800-CDC-INFO) or ? Visit CDC's website at http://hunter.com/ CDC DTaP Vaccine (Diphtheria, Tetanus, and Pertussis) VIS (04/26/06) This information is not intended to replace advice given to you by your  health care provider. Make sure you discuss any questions you have with your health care provider. Document Released: 09/24/2006 Document Revised: 08/17/2016 Document Reviewed: 08/17/2016 Elsevier Interactive Patient Education  2017 [place]  Denosumab injection What is this medicine? DENOSUMAB (den oh sue mab) slows bone breakdown. Prolia is used to treat osteoporosis in women after menopause and in men. Delton See is used to treat a high calcium level due to cancer and to prevent bone fractures and other bone problems caused by multiple myeloma or cancer bone metastases. Delton See is also used to treat giant cell tumor of the bone. This medicine may be used for other purposes; ask your health care provider or pharmacist if you have questions. COMMON BRAND NAME(S): Prolia, XGEVA What should I tell my health care provider before I take this medicine? They need to know if you have any of these conditions: -dental disease -having surgery or tooth extraction -infection -kidney disease -low levels of calcium or Vitamin D in the blood -malnutrition -on hemodialysis -skin conditions or sensitivity -thyroid or parathyroid disease -an unusual reaction to denosumab, other medicines, foods, dyes, or preservatives -pregnant or trying to get pregnant -breast-feeding How should I use this medicine? This medicine is for injection under the skin. It is given by a health care professional in a hospital or clinic setting. If you are getting Prolia, a special MedGuide will be given to you by the pharmacist with each prescription and refill. Be sure to read this information carefully each time. For Prolia, talk to your pediatrician regarding the use of this medicine in children. Special care may be needed. For Delton See, talk to your pediatrician regarding the use of this medicine in children. While this drug may be prescribed for children as young as 13 years for selected conditions, precautions do  apply. Overdosage: If you think you have taken too much of this medicine contact a poison control center or emergency room at once. NOTE: This medicine is only for you. Do not share this medicine with others. What if I miss a dose? It is important not  to miss your dose. Call your doctor or health care professional if you are unable to keep an appointment. What may interact with this medicine? Do not take this medicine with any of the following medications: -other medicines containing denosumab This medicine may also interact with the following medications: -medicines that lower your chance of fighting infection -steroid medicines like prednisone or cortisone This list may not describe all possible interactions. Give your health care provider a list of all the medicines, herbs, non-prescription drugs, or dietary supplements you use. Also tell them if you smoke, drink alcohol, or use illegal drugs. Some items may interact with your medicine. What should I watch for while using this medicine? Visit your doctor or health care professional for regular checks on your progress. Your doctor or health care professional may order blood tests and other tests to see how you are doing. Call your doctor or health care professional for advice if you get a fever, chills or sore throat, or other symptoms of a cold or flu. Do not treat yourself. This drug may decrease your body's ability to fight infection. Try to avoid being around people who are sick. You should make sure you get enough calcium and vitamin D while you are taking this medicine, unless your doctor tells you not to. Discuss the foods you eat and the vitamins you take with your health care professional. See your dentist regularly. Brush and floss your teeth as directed. Before you have any dental work done, tell your dentist you are receiving this medicine. Do not become pregnant while taking this medicine or for 5 months after stopping it. Talk with  your doctor or health care professional about your birth control options while taking this medicine. Women should inform their doctor if they wish to become pregnant or think they might be pregnant. There is a potential for serious side effects to an unborn child. Talk to your health care professional or pharmacist for more information. What side effects may I notice from receiving this medicine? Side effects that you should report to your doctor or health care professional as soon as possible: -allergic reactions like skin rash, itching or hives, swelling of the face, lips, or tongue -bone pain -breathing problems -dizziness -jaw pain, especially after dental work -redness, blistering, peeling of the skin -signs and symptoms of infection like fever or chills; cough; sore throat; pain or trouble passing urine -signs of low calcium like fast heartbeat, muscle cramps or muscle pain; pain, tingling, numbness in the hands or feet; seizures -unusual bleeding or bruising -unusually weak or tired Side effects that usually do not require medical attention (report to your doctor or health care professional if they continue or are bothersome): -constipation -diarrhea -headache -joint pain -loss of appetite -muscle pain -runny nose -tiredness -upset stomach This list may not describe all possible side effects. Call your doctor for medical advice about side effects. You may report side effects to FDA at 1-800-FDA-1088. Where should I keep my medicine? This medicine is only given in a clinic, doctor's office, or other health care setting and will not be stored at home. NOTE: This sheet is a summary. It may not cover all possible information. If you have questions about this medicine, talk to your doctor, pharmacist, or health care provider.  2018 Elsevier/Gold Standard (2016-12-19 19:17:21)

## 2018-11-19 ENCOUNTER — Ambulatory Visit (INDEPENDENT_AMBULATORY_CARE_PROVIDER_SITE_OTHER): Payer: Medicare Other | Admitting: Internal Medicine

## 2018-11-19 ENCOUNTER — Telehealth: Payer: Self-pay | Admitting: Internal Medicine

## 2018-11-19 ENCOUNTER — Encounter: Payer: Self-pay | Admitting: Internal Medicine

## 2018-11-19 VITALS — BP 126/78 | HR 71 | Temp 98.2°F | Ht 65.0 in | Wt 107.8 lb

## 2018-11-19 DIAGNOSIS — M25562 Pain in left knee: Secondary | ICD-10-CM

## 2018-11-19 DIAGNOSIS — D751 Secondary polycythemia: Secondary | ICD-10-CM

## 2018-11-19 DIAGNOSIS — M25561 Pain in right knee: Secondary | ICD-10-CM | POA: Diagnosis not present

## 2018-11-19 DIAGNOSIS — G8929 Other chronic pain: Secondary | ICD-10-CM

## 2018-11-19 DIAGNOSIS — L719 Rosacea, unspecified: Secondary | ICD-10-CM | POA: Insufficient documentation

## 2018-11-19 DIAGNOSIS — R634 Abnormal weight loss: Secondary | ICD-10-CM

## 2018-11-19 DIAGNOSIS — R17 Unspecified jaundice: Secondary | ICD-10-CM

## 2018-11-19 DIAGNOSIS — R739 Hyperglycemia, unspecified: Secondary | ICD-10-CM

## 2018-11-19 MED ORDER — METRONIDAZOLE 1 % EX GEL: Freq: Every day | CUTANEOUS | 12 refills | Status: DC

## 2018-11-19 NOTE — Progress Notes (Signed)
Pre visit review using our clinic review tool, if applicable. No additional management support is needed unless otherwise documented below in the visit note. 

## 2018-11-19 NOTE — Progress Notes (Signed)
Chief Complaint  Patient presents with  . Follow-up   F/u  1. Reviewed labs total bilirubin elevated and hemoglobin and crystals and mucous in urine  2. Chronic knee pain and stiffness takes tumeric and gluc-chondroitin supplements pain is 0 today not bothersome  3. Osteoporosis declines medications due to c/w side effects  4. Rosacea was using 0.75 cream metrogel qd and wants refill disc 1% is daily and 0.75 % is bid she wants to try higher dose   Review of Systems  Constitutional: Negative for weight loss.       Weight stable since last visit    HENT: Negative for hearing loss.   Eyes: Negative for blurred vision.  Respiratory: Negative for shortness of breath.   Cardiovascular: Negative for chest pain.  Gastrointestinal: Negative for abdominal pain.  Musculoskeletal: Negative for joint pain.  Skin: Negative for rash.  Neurological: Negative for headaches.  Psychiatric/Behavioral: Negative for depression.   Past Medical History:  Diagnosis Date  . Anxiety    knees, low back   . Arthritis   . History of chicken pox   . History of macular degeneration   . History of shingles    x3   . Osteoporosis   . 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 ABDOMINAL HYSTERECTOMY     DUB, endometriosis no h/o abnormal pap   . APPENDECTOMY    . COLONOSCOPY N/A 04/30/2015   Procedure: COLONOSCOPY;  Surgeon: Josefine Class, MD;  Location: [facility] ENDOSCOPY;  Service: Endoscopy;  Laterality: N/A;  . TONSILLECTOMY     Family History  Problem Relation Age of Onset  . Stroke Mother   . Cancer Father        prostate   . Pneumonia Maternal Grandmother   . Hip fracture Maternal Grandmother   . Stroke Paternal Grandfather   . Skin cancer Other   . Breast cancer Neg Hx    Social History   Socioeconomic History  . Marital status: Widowed    Spouse name: Not on file  . Number of children: Not on file  . Years of  education: Not on file  . Highest education level: Not on file  Occupational History  . Not on file  Social Needs  . Financial resource strain: Not on file  . Food insecurity:    Worry: Not on file    Inability: Not on file  . Transportation needs:    Medical: Not on file    Non-medical: Not on file  Tobacco Use  . Smoking status: Never Smoker  . Smokeless tobacco: Never Used  Substance and Sexual Activity  . Alcohol use: Not Currently  . Drug use: Not Currently  . Sexual activity: Not Currently    Partners: Male  Lifestyle  . Physical activity:    Days per week: Not on file    Minutes per session: Not on file  . Stress: Not on file  Relationships  . Social connections:    Talks on phone: Not on file    Gets together: Not on file    Attends religious service: Not on file    Active member of club or organization: Not on file    Attends meetings of clubs or organizations: Not on file    Relationship status: Not on file  . Intimate partner violence:    Fear of current or ex partner: Not on file  Emotionally abused: Not on file    Physically abused: Not on file    Forced sexual activity: Not on file  Other Topics Concern  . 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 aspirin 325 MG tablet Take 325 mg by mouth every 6 (six) hours as needed for headache.  . Glucosamine-Chondroitin (GLUCOSAMINE CHONDR COMPLEX PO) Take by mouth.  Marland Kitchen ibuprofen (ADVIL,MOTRIN) 200 MG tablet Take 200 mg by mouth 2 (two) times daily as needed for moderate pain.  . Multiple Vitamin (MULTIVITAMIN WITH MINERALS) TABS tablet Take 1 tablet by mouth every other day.  .  multivitamin-lutein (OCUVITE-LUTEIN) CAPS capsule Take 1 capsule by mouth every other day.  . triamcinolone cream (KENALOG) 0.1 % Apply 1 application topically 2 (two) times daily. Right upper back as needed   Allergies  Allergen Reactions  . Amoxil [Amoxicillin]   . Tylenol [Acetaminophen]     ?reaction    . Ampicillin Rash  . Keflex [Cephalexin] Rash  . Macrobid [Nitrofurantoin] Rash  . Ofloxacin Rash  . Penicillins Rash  . Sulfur Rash   Recent Results (from the past 2160 hour(s))  Comprehensive metabolic panel     Status: Abnormal   Collection Time: 10/16/18  9:13 AM  Result Value Ref Range   Sodium 140 135 - 145 mEq/L   Potassium 4.1 3.5 - 5.1 mEq/L   Chloride 105 96 - 112 mEq/L   CO2 28 19 - 32 mEq/L   Glucose, Bld 103 (H) 70 - 99 mg/dL   BUN 15 6 - 23 mg/dL   Creatinine, Ser 0.83 0.40 - 1.20 mg/dL   Total Bilirubin 1.8 (H) 0.2 - 1.2 mg/dL   Alkaline Phosphatase 65 39 - 117 U/L   AST 20 0 - 37 U/L   ALT 12 0 - 35 U/L   Total Protein 6.5 6.0 - 8.3 g/dL   Albumin 4.5 3.5 - 5.2 g/dL   Calcium 9.6 8.4 - 10.5 mg/dL   GFR 71.54 >60.00 mL/min  CBC with Differential/Platelet     Status: Abnormal   Collection Time: 10/16/18  9:13 AM  Result Value Ref Range   WBC 5.0 4.0 - 10.5 K/uL   RBC 4.69 3.87 - 5.11 Mil/uL   Hemoglobin 15.3 (H) 12.0 - 15.0 g/dL   HCT 44.8 36.0 - 46.0 %   MCV 95.6 78.0 - 100.0 fl   MCHC 34.1 30.0 - 36.0 g/dL   RDW 12.6 11.5 - 15.5 %   Platelets 255.0 150.0 - 400.0 K/uL   Neutrophils Relative % 64.3 43.0 - 77.0 %   Lymphocytes Relative 25.8 12.0 - 46.0 %   Monocytes Relative 8.4 3.0 - 12.0 %   Eosinophils Relative 1.2 0.0 - 5.0 %   Basophils Relative 0.3 0.0 - 3.0 %   Neutro Abs 3.2 1.4 - 7.7 K/uL   Lymphs Abs 1.3 0.7 - 4.0 K/uL   Monocytes Absolute 0.4 0.1 - 1.0 K/uL   Eosinophils Absolute 0.1 0.0 - 0.7 K/uL   Basophils Absolute 0.0 0.0 -  0.1 K/uL  Lipid panel     Status: Abnormal   Collection Time: 10/16/18  9:13 AM  Result Value Ref Range    Cholesterol 188 0 - 200 mg/dL    Comment: ATP III Classification       Desirable:  < 200 mg/dL               Borderline High:  200 - 239 mg/dL          High:  > = 240 mg/dL   Triglycerides 69.0 0.0 - 149.0 mg/dL    Comment: Normal:  <150 mg/dLBorderline High:  150 - 199 mg/dL   HDL 65.70 >39.00 mg/dL   VLDL 13.8 0.0 - 40.0 mg/dL   LDL Cholesterol 109 (H) 0 - 99 mg/dL   Total CHOL/HDL Ratio 3     Comment:                Men          Women1/2 Average Risk     3.4          3.3Average Risk          5.0          4.42X Average Risk          9.6          7.13X Average Risk          15.0          11.0                       NonHDL 122.70     Comment: NOTE:  Non-HDL goal should be 30 mg/dL higher than patient's LDL goal (i.e. LDL goal of < 70 mg/dL, would have non-HDL goal of < 100 mg/dL)  TSH     Status: None   Collection Time: 10/16/18  9:13 AM  Result Value Ref Range   TSH 3.29 0.35 - 4.50 uIU/mL  T4, free     Status: None   Collection Time: 10/16/18  9:13 AM  Result Value Ref Range   Free T4 0.82 0.60 - 1.60 ng/dL    Comment: Specimens from patients who are undergoing biotin therapy and /or ingesting biotin supplements may contain high levels of biotin.  The higher biotin concentration in these specimens interferes with this Free T4 assay.  Specimens that contain high levels  of biotin may cause false high results for this Free T4 assay.  Please interpret results in light of the total clinical presentation of the patient.    Urinalysis, Routine w reflex microscopic     Status: Abnormal   Collection Time: 10/16/18  9:13 AM  Result Value Ref Range   Color, Urine YELLOW Yellow;Lt. Yellow   APPearance CLEAR Clear   Specific Gravity, Urine >=1.030 (A) 1.000 - 1.030   pH 5.5 5.0 - 8.0   Total Protein, Urine TRACE (A) Negative   Urine Glucose NEGATIVE Negative   Ketones, ur NEGATIVE Negative   Bilirubin Urine NEGATIVE Negative   Hgb urine dipstick NEGATIVE Negative   Urobilinogen, UA 0.2 0.0  - 1.0   Leukocytes, UA NEGATIVE Negative   Nitrite NEGATIVE Negative   WBC, UA 0-2/hpf 0-2/hpf   RBC / HPF 0-2/hpf 0-2/hpf   Mucus, UA Presence of (A) None   Ca Oxalate Crys, UA Presence of (A) None  VITAMIN D 25 Hydroxy (Vit-D Deficiency, Fractures)     Status: None   Collection Time: 10/16/18  9:13  AM  Result Value Ref Range   VITD 31.80 30.00 - 100.00 ng/mL   Objective  Body mass index is 17.94 kg/m. Wt Readings from Last 3 Encounters:  11/19/18 107 lb 12.8 oz (48.9 kg)  10/16/18 106 lb 12.8 oz (48.4 kg)  04/30/15 117 lb (53.1 kg)   Temp Readings from Last 3 Encounters:  11/19/18 98.2 F (36.8 C) (Oral)  10/16/18 (!) 97.5 F (36.4 C) (Oral)  04/30/15 (!) 96.4 F (35.8 C) (Tympanic)   BP Readings from Last 3 Encounters:  11/19/18 126/78  10/16/18 118/70  04/30/15 138/62   Pulse Readings from Last 3 Encounters:  11/19/18 71  10/16/18 77  04/30/15 63    Physical Exam  Constitutional: She is oriented to person, place, and time. Vital signs are normal. She appears well-developed and well-nourished. She is cooperative.  HENT:  Head: Normocephalic and atraumatic.  Mouth/Throat: Oropharynx is clear and moist and mucous membranes are normal.  Eyes: Pupils are equal, round, and reactive to light. Conjunctivae are normal.  Cardiovascular: Normal rate, regular rhythm and normal heart sounds.  Pulmonary/Chest: Effort normal and breath sounds normal.  Neurological: She is alert and oriented to person, place, and time. Gait normal.  Skin: Skin is warm, dry and intact.  Psychiatric: She has a normal mood and affect. Her speech is normal and behavior is normal. Judgment and thought content normal. Cognition and memory are normal.  Nursing note and vitals reviewed.   Assessment   1. Elevated bilirubin and weight loss though weight stable since last visit  2. Polycythemia  3. Chronic intermittent knee pain and stiffness likely arthritis 4. Rosacea  5. HM Plan   1 Check  fractionated bili today  If still elevated consider US abdomen  Disc premier protein shakes  2. Check CBC today  3. Declines ortho referral for now Disc tumeric already doing and GC supplements  Call back if knee pain worse  4. Refilled metrogel 1% qd increase from 0.75 bdi  5.   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 Olivia Mackie McLean-Scocuzza-Internal Medicine

## 2018-11-19 NOTE — Patient Instructions (Addendum)
Tumeric or glucosamine/chondroitin for joint pain  Tylenol as needed  Call if knee pain is getting worse   prevnar 13 vaccine consider   Can you try red pepper cream/Capsaicin cream for back itching   Premier protein shake    Pneumococcal Conjugate Vaccine suspension for injection What is this medicine? PNEUMOCOCCAL VACCINE (NEU mo KOK al vak SEEN) is a vaccine used to prevent pneumococcus bacterial infections. These bacteria can cause serious infections like pneumonia, meningitis, and blood infections. This vaccine will lower your chance of getting pneumonia. If you do get pneumonia, it can make your symptoms milder and your illness shorter. This vaccine will not treat an infection and will not cause infection. This vaccine is recommended for infants and young children, adults with certain medical conditions, and adults 14 years or older. This medicine may be used for other purposes; ask your health care provider or pharmacist if you have questions. COMMON BRAND NAME(S): Prevnar, Prevnar 13 What should I tell my health care provider before I take this medicine? They need to know if you have any of these conditions: -bleeding problems -fever -immune system problems -an unusual or allergic reaction to pneumococcal vaccine, diphtheria toxoid, other vaccines, latex, other medicines, foods, dyes, or preservatives -pregnant or trying to get pregnant -breast-feeding How should I use this medicine? This vaccine is for injection into a muscle. It is given by a health care professional. A copy of Vaccine Information Statements will be given before each vaccination. Read this sheet carefully each time. The sheet may change frequently. Talk to your pediatrician regarding the use of this medicine in children. While this drug may be prescribed for children as young as 38 weeks old for selected conditions, precautions do apply. Overdosage: If you think you have taken too much of this medicine contact a  poison control center or emergency room at once. NOTE: This medicine is only for you. Do not share this medicine with others. What if I miss a dose? It is important not to miss your dose. Call your doctor or health care professional if you are unable to keep an appointment. What may interact with this medicine? -medicines for cancer chemotherapy -medicines that suppress your immune function -steroid medicines like prednisone or cortisone This list may not describe all possible interactions. Give your health care provider a list of all the medicines, herbs, non-prescription drugs, or dietary supplements you use. Also tell them if you smoke, drink alcohol, or use illegal drugs. Some items may interact with your medicine. What should I watch for while using this medicine? Mild fever and pain should go away in 3 days or less. Report any unusual symptoms to your doctor or health care professional. What side effects may I notice from receiving this medicine? Side effects that you should report to your doctor or health care professional as soon as possible: -allergic reactions like skin rash, itching or hives, swelling of the face, lips, or tongue -breathing problems -confused -fast or irregular heartbeat -fever over 102 degrees F -seizures -unusual bleeding or bruising -unusual muscle weakness Side effects that usually do not require medical attention (report to your doctor or health care professional if they continue or are bothersome): -aches and pains -diarrhea -fever of 102 degrees F or less -headache -irritable -loss of appetite -pain, tender at site where injected -trouble sleeping This list may not describe all possible side effects. Call your doctor for medical advice about side effects. You may report side effects to FDA at 1-800-FDA-1088. Where  should I keep my medicine? This does not apply. This vaccine is given in a clinic, pharmacy, doctor's office, or other health care setting  and will not be stored at home. NOTE: This sheet is a summary. It may not cover all possible information. If you have questions about this medicine, talk to your doctor, pharmacist, or health care provider.  2018 Elsevier/Gold Standard (2014-09-03 10:27:27)   Results for Evelyn Barker, Evelyn Barker (MRN [id number]) as of 11/19/2018 15:30  Ref. Range 10/16/2018 09:13  Sodium Latest Ref Range: 135 - 145 mEq/L 140  Potassium Latest Ref Range: 3.5 - 5.1 mEq/L 4.1  Chloride Latest Ref Range: 96 - 112 mEq/L 105  CO2 Latest Ref Range: 19 - 32 mEq/L 28  Glucose Latest Ref Range: 70 - 99 mg/dL 103 (H)  BUN Latest Ref Range: 6 - 23 mg/dL 15  Creatinine Latest Ref Range: 0.40 - 1.20 mg/dL 0.83  Calcium Latest Ref Range: 8.4 - 10.5 mg/dL 9.6  Alkaline Phosphatase Latest Ref Range: 39 - 117 U/L 65  Albumin Latest Ref Range: 3.5 - 5.2 g/dL 4.5  AST Latest Ref Range: 0 - 37 U/L 20  ALT Latest Ref Range: 0 - 35 U/L 12  Total Protein Latest Ref Range: 6.0 - 8.3 g/dL 6.5  Total Bilirubin Latest Ref Range: 0.2 - 1.2 mg/dL 1.8 (H)  GFR Latest Ref Range: >60.00 mL/min 71.54  Total CHOL/HDL Ratio Unknown 3  Cholesterol Latest Ref Range: 0 - 200 mg/dL 188  HDL Cholesterol Latest Ref Range: >39.00 mg/dL 65.70  LDL (calc) Latest Ref Range: 0 - 99 mg/dL 109 (H)  NonHDL Unknown 122.70  Triglycerides Latest Ref Range: 0.0 - 149.0 mg/dL 69.0  VLDL Latest Ref Range: 0.0 - 40.0 mg/dL 13.8  VITD Latest Ref Range: 30.00 - 100.00 ng/mL 31.80  WBC Latest Ref Range: 4.0 - 10.5 K/uL 5.0  RBC Latest Ref Range: 3.87 - 5.11 Mil/uL 4.69  Hemoglobin Latest Ref Range: 12.0 - 15.0 g/dL 15.3 (H)  HCT Latest Ref Range: 36.0 - 46.0 % 44.8  MCV Latest Ref Range: 78.0 - 100.0 fl 95.6  MCHC Latest Ref Range: 30.0 - 36.0 g/dL 34.1  RDW Latest Ref Range: 11.5 - 15.5 % 12.6  Platelets Latest Ref Range: 150.0 - 400.0 K/uL 255.0  Neutrophils Latest Ref Range: 43.0 - 77.0 % 64.3  Lymphocytes Latest Ref Range: 12.0 - 46.0 % 25.8  Monocytes Relative  Latest Ref Range: 3.0 - 12.0 % 8.4  Eosinophil Latest Ref Range: 0.0 - 5.0 % 1.2  Basophil Latest Ref Range: 0.0 - 3.0 % 0.3  NEUT# Latest Ref Range: 1.4 - 7.7 K/uL 3.2  Lymphocyte # Latest Ref Range: 0.7 - 4.0 K/uL 1.3  Monocyte # Latest Ref Range: 0.1 - 1.0 K/uL 0.4  Eosinophils Absolute Latest Ref Range: 0.0 - 0.7 K/uL 0.1  Basophils Absolute Latest Ref Range: 0.0 - 0.1 K/uL 0.0  TSH Latest Ref Range: 0.35 - 4.50 uIU/mL 3.29  T4,Free(Direct) Latest Ref Range: 0.60 - 1.60 ng/dL 0.82  Results for Evelyn Barker, Evelyn Barker (MRN [id number]) as of 11/19/2018 15:30  Ref. Range 10/16/2018 09:13  URINALYSIS, ROUTINE W REFLEX MICROSCOPIC Unknown Rpt (A)  Appearance Latest Ref Range: Clear  CLEAR  Bilirubin Urine Latest Ref Range: Negative  NEGATIVE  Color, Urine Latest Ref Range: Yellow;Lt. Yellow  YELLOW  Hgb urine dipstick Latest Ref Range: Negative  NEGATIVE  Ketones, ur Latest Ref Range: Negative  NEGATIVE  Leukocytes, UA Latest Ref Range: Negative  NEGATIVE  Nitrite Latest Ref Range:  Negative  NEGATIVE  pH Latest Ref Range: 5.0 - 8.0  5.5  Specific Gravity, Urine Latest Ref Range: 1.000 - 1.030  >=1.030 (A)  Urine Glucose Latest Ref Range: Negative  NEGATIVE  Urobilinogen, UA Latest Ref Range: 0.0 - 1.0  0.2  Ca Oxalate Crys, UA Latest Ref Range: None  Presence of (A)  Mucus, UA Latest Ref Range: None  Presence of (A)  RBC / HPF Latest Ref Range: 0-2/hpf  0-2/hpf  WBC, UA Latest Ref Range: 0-2/hpf  0-2/hpf  Total Protein, [id number] Latest Ref Range: Negative  TRACE (A)

## 2018-11-19 NOTE — Telephone Encounter (Signed)
FYI-At check out pt was given time to return pt states can she wait and call back to schedule. Thank you!

## 2018-11-20 ENCOUNTER — Other Ambulatory Visit: Payer: Self-pay | Admitting: Internal Medicine

## 2018-11-20 DIAGNOSIS — R748 Abnormal levels of other serum enzymes: Secondary | ICD-10-CM

## 2018-11-20 LAB — CBC WITH DIFFERENTIAL/PLATELET
Basophils Absolute: 0 10*3/uL (ref 0.0–0.1)
Basophils Relative: 0.6 % (ref 0.0–3.0)
EOS ABS: 0 10*3/uL (ref 0.0–0.7)
Eosinophils Relative: 0.9 % (ref 0.0–5.0)
HCT: 42.3 % (ref 36.0–46.0)
Hemoglobin: 14.4 g/dL (ref 12.0–15.0)
Lymphocytes Relative: 30.9 % (ref 12.0–46.0)
Lymphs Abs: 1.7 10*3/uL (ref 0.7–4.0)
MCHC: 34.1 g/dL (ref 30.0–36.0)
MCV: 96.2 fl (ref 78.0–100.0)
Monocytes Absolute: 0.5 10*3/uL (ref 0.1–1.0)
Monocytes Relative: 8.2 % (ref 3.0–12.0)
Neutro Abs: 3.3 10*3/uL (ref 1.4–7.7)
Neutrophils Relative %: 59.4 % (ref 43.0–77.0)
Platelets: 250 10*3/uL (ref 150.0–400.0)
RBC: 4.4 Mil/uL (ref 3.87–5.11)
RDW: 12.8 % (ref 11.5–15.5)
WBC: 5.5 10*3/uL (ref 4.0–10.5)

## 2018-11-20 LAB — BILIRUBIN, FRACTIONATED(TOT/DIR/INDIR)
Bilirubin, Direct: 0.4 mg/dL — ABNORMAL HIGH (ref 0.0–0.2)
Indirect Bilirubin: 1.6 mg/dL (calc) — ABNORMAL HIGH (ref 0.2–1.2)
Total Bilirubin: 2 mg/dL — ABNORMAL HIGH (ref 0.2–1.2)

## 2018-11-20 LAB — HEMOGLOBIN A1C: HEMOGLOBIN A1C: 5.6 % (ref 4.6–6.5)

## 2018-12-03 ENCOUNTER — Ambulatory Visit: Payer: Medicare Other

## 2018-12-17 ENCOUNTER — Ambulatory Visit
Admission: RE | Admit: 2018-12-17 | Discharge: 2018-12-17 | Disposition: A | Discharge status: Home / Self Care | Payer: Medicare Other | Source: Ambulatory Visit | Attending: Internal Medicine | Admitting: Internal Medicine

## 2018-12-17 DIAGNOSIS — R7989 Other specified abnormal findings of blood chemistry: Secondary | ICD-10-CM | POA: Diagnosis not present

## 2018-12-17 DIAGNOSIS — R748 Abnormal levels of other serum enzymes: Secondary | ICD-10-CM | POA: Diagnosis not present

## 2018-12-19 ENCOUNTER — Encounter: Payer: Self-pay | Admitting: *Deleted

## 2018-12-24 ENCOUNTER — Telehealth: Payer: Self-pay | Admitting: Internal Medicine

## 2018-12-24 NOTE — Telephone Encounter (Signed)
Copied from [place] [id number]. Topic: Quick Communication - See Telephone Encounter >> Dec 24, 2018  2:52 PM Rutherford Nail, NT wrote: CRM for notification. See Telephone encounter for: 12/24/18. Patient calling and states that she received her lab results, but she would like to know if she needs to change her eating habits since her bilirubin was elevated? Also states that she is having on and off pain under her left rib cage. Rating it 6.5/10 when it hits. States that it seems to only happen when she is standing up and has only happened a couple of times. CB#: [phone]

## 2018-12-26 NOTE — Telephone Encounter (Signed)
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 I.e elevated bilirubin?   If she is having pain and this is new and we have not discuss'ed the issue she needs to make an appt to be seen?   Evelyn Barker

## 2018-12-27 ENCOUNTER — Other Ambulatory Visit: Payer: Self-pay | Admitting: Internal Medicine

## 2018-12-27 DIAGNOSIS — K760 Fatty (change of) liver, not elsewhere classified: Secondary | ICD-10-CM | POA: Insufficient documentation

## 2018-12-27 DIAGNOSIS — R17 Unspecified jaundice: Secondary | ICD-10-CM

## 2018-12-27 DIAGNOSIS — R1012 Left upper quadrant pain: Secondary | ICD-10-CM

## 2018-12-27 NOTE — Telephone Encounter (Signed)
Patient was informed. She is ok with referral to GI.   Patient understood and no questions, comments, or concerns at this time. Pain comes and goes. She hasnt had pain all this week.

## 2019-01-07 DIAGNOSIS — R17 Unspecified jaundice: Secondary | ICD-10-CM | POA: Diagnosis not present

## 2019-01-07 DIAGNOSIS — K760 Fatty (change of) liver, not elsewhere classified: Secondary | ICD-10-CM | POA: Diagnosis not present

## 2019-02-18 ENCOUNTER — Ambulatory Visit: Payer: Self-pay | Admitting: *Deleted

## 2019-02-18 NOTE — Telephone Encounter (Signed)
Pt reports just returned from prolonged travel; 2 flights and car travel. States "Trip started at 8am and I've been either sitting and waiting or on flights or in a car." Reports constant pain, 4/10 ,behind left knee onset today. States no redness or warmth "But hard to tell, difficult to see back there." States she took 2 aspirin this AM. Denies SOB. Pt directed to ED given prolonged travel. Pt states will follow disposition.   Reason for Disposition . [1] Thigh or calf pain AND [2] only 1 side AND [3] present > 1 hour    After prolonged travel, flights  Answer Assessment - Initial Assessment Questions 1. LOCATION and RADIATION: "Where is the pain located?"     Behind left knee 2. QUALITY: "What does the pain feel like?"  (e.g., sharp, dull, aching, burning)      3. SEVERITY: "How bad is the pain?" "What does it keep you from doing?"   (Scale 1-10; or mild, moderate, severe)   -  MILD (1-3): doesn't interfere with normal activities    -  MODERATE (4-7): interferes with normal activities (e.g., work or school) or awakens from sleep, limping    -  SEVERE (8-10): excruciating pain, unable to do any normal activities, unable to walk    4/10 4. ONSET: "When did the pain start?" "Does it come and go, or is it there all the time?"    constant 5. RECURRENT: "Have you had this pain before?" If so, ask: "When, and what happened then?"     no 6. SETTING: "Has there been any recent work, exercise or other activity that involved that part of the body?"     no 7. AGGRAVATING FACTORS: "What makes the knee pain worse?" (e.g., walking, climbing stairs, running)     Sitting worsens 8. ASSOCIATED SYMPTOMS: "Is there any swelling or redness of the knee?"    no 9. OTHER SYMPTOMS: "Do you have any other symptoms?" (e.g., chest pain, difficulty breathing, fever, calf pain)     Calf pain  Protocols used: KNEE PAIN-A-AH

## 2019-02-24 ENCOUNTER — Other Ambulatory Visit: Payer: Medicare Other

## 2019-03-06 ENCOUNTER — Other Ambulatory Visit: Payer: Medicare Other

## 2019-03-06 DIAGNOSIS — Z2009 Contact with and (suspected) exposure to other intestinal infectious diseases: Secondary | ICD-10-CM

## 2019-03-15 LAB — NOVEL CORONAVIRUS, NAA: SARS-CoV-2, NAA: NOT DETECTED

## 2019-04-09 ENCOUNTER — Ambulatory Visit (INDEPENDENT_AMBULATORY_CARE_PROVIDER_SITE_OTHER): Payer: Medicare Other | Admitting: Internal Medicine

## 2019-04-09 DIAGNOSIS — R109 Unspecified abdominal pain: Secondary | ICD-10-CM

## 2019-04-09 DIAGNOSIS — R634 Abnormal weight loss: Secondary | ICD-10-CM

## 2019-04-09 DIAGNOSIS — R079 Chest pain, unspecified: Secondary | ICD-10-CM

## 2019-04-09 DIAGNOSIS — R0781 Pleurodynia: Secondary | ICD-10-CM | POA: Diagnosis not present

## 2019-04-09 DIAGNOSIS — R110 Nausea: Secondary | ICD-10-CM

## 2019-04-09 MED ORDER — PANTOPRAZOLE SODIUM 20 MG PO TBEC: 20.0000 mg | DELAYED_RELEASE_TABLET | Freq: Every day | ORAL | 0 refills | Status: DC

## 2019-04-09 NOTE — Progress Notes (Signed)
Telephone Note  I connected with Evelyn Barker   on 04/09/19 at 11:15 AM EDT by telephone and verified that I am speaking with the correct person using two identifiers.  Location patient: home Location provider:work  Persons participating in the virtual visit: patient, provider  I discussed the limitations of evaluation and management by telemedicine and the availability of in person appointments. The patient expressed understanding and agreed to proceed.    HPI: C/o weight loss down to 100 lbs though appetite is normal  C/o left rib pain left upper abdomen pain since late Feb early March on and off after standing 10 minutes worse pain at times 8-9/10 and has felt nauseated w/o vomiting 3-4 x. Pain is intermittent stomach feels bloated with weight loss tried peptobismol and it helped. Denies CP, sob. Denies trauma [place] 12/2018 + fatty liver pancreas not visualized   ROS: See pertinent positives and negatives per HPI.  Past Medical History:  Diagnosis Date  . Anxiety    knees, low back   . Arthritis   . History of chicken pox   . History of macular degeneration   . History of shingles    x3   . Osteoporosis   . Osteoporosis    declines medication   . 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 ABDOMINAL HYSTERECTOMY     DUB, endometriosis no h/o abnormal pap   . APPENDECTOMY    . COLONOSCOPY N/A 04/30/2015   Procedure: COLONOSCOPY;  Surgeon: Josefine Class, MD;  Location: [facility] ENDOSCOPY;  Service: Endoscopy;  Laterality: N/A;  . TONSILLECTOMY      Family History  Problem Relation Age of Onset  . Stroke Mother   . Cancer Father        prostate   . Pneumonia Maternal Grandmother   . Hip fracture Maternal Grandmother   . Stroke Paternal Grandfather   . Skin cancer Other   . Breast cancer Neg Hx     SOCIAL HX: lives at home   Current Outpatient Medications:  .  aspirin 325 MG tablet, Take 325 mg by  mouth every 6 (six) hours as needed for headache., Disp: , Rfl:  .  Glucosamine-Chondroitin (GLUCOSAMINE CHONDR COMPLEX PO), Take by mouth., Disp: , Rfl:  .  ibuprofen (ADVIL,MOTRIN) 200 MG tablet, Take 200 mg by mouth 2 (two) times daily as needed for moderate pain., Disp: , Rfl:  .  metroNIDAZOLE (METROGEL) 1 % gel, Apply topically daily. To face, Disp: 60 g, Rfl: 12 .  Multiple Vitamin (MULTIVITAMIN WITH MINERALS) TABS tablet, Take 1 tablet by mouth every other day., Disp: , Rfl:  .  multivitamin-lutein (OCUVITE-LUTEIN) CAPS capsule, Take 1 capsule by mouth every other day., Disp: , Rfl:  .  pantoprazole (PROTONIX) 20 MG tablet, Take 1 tablet (20 mg total) by mouth daily. 30 min before breakfast, Disp: 30 tablet, Rfl: 0 .  triamcinolone cream (KENALOG) 0.1 %, Apply 1 application topically 2 (two) times daily. Right upper back as needed, Disp: 45 g, Rfl: 0  EXAM:  VITALS per patient if applicable:  GENERAL: alert, oriented, appears well and in no acute distress   PSYCH/NEURO: pleasant and cooperative, no obvious depression or anxiety, speech and thought processing grossly intact  ASSESSMENT AND PLAN:  Discussed the following assessment and plan:  Rib pain on left side, LUQ ab pain, epigastric pain - Plan: DG Chest 2 View, DG Ribs Unilateral  Left, EKG 12-Lead, Comprehensive metabolic panel, CBC w/Diff, Lipase, Troponin I Left sided abdominal pain - Plan: pantoprazole (PROTONIX) 20 MG tablet, EKG 12-Lead, Comprehensive metabolic panel, CBC w/Diff, Lipase, Troponin I Trial of protonix 20 mg qam  -of pain continues consider GI referral for EGD and CT ab/pelvis pt wants to hold for now   Chest pain, unspecified type - Plan: DG Chest 2 View, Comprehensive metabolic panel, CBC w/Diff, Lipase, Troponin I Nausea - Plan: EKG 12-Lead, Comprehensive metabolic panel, CBC w/Diff, Lipase -r/o cardiac etiology of epigastric pain and nausea   Weight loss - Plan: Comprehensive metabolic panel, CBC  w/Diff, TSH -consider GI in future for EGD  Pt wants to hold for now  HM Flu shot had 10/06/18 [facility]  pna 23 had 08/10/10, rec prevnar disc today pt will think about it  Given Rx shingrix  Tdap had 10/11/12  zostervax had   Never smoker  No need for pap s/p hysterectomy DUB, endometriosis  Mammogram due 02/20/2019 order in  -pt to call to sch  A1C 5.6  dexa had 11/15/2017 +osteoporosis -2.9 left femur will repeat  -Pt never took fosamax disc prolia today declines 2/2 c/w side effects  -order in will need to schedule   Colonoscopy h/o polyp tubular 05/03/15 repeat in 5 years 2021 fobt neg 11/03/12  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 The patient was provided an opportunity to ask questions and all were answered. The patient agreed with the plan and demonstrated an understanding of the instructions.   The patient was advised to call back or seek an in-person evaluation if the symptoms worsen or if the condition fails to improve as anticipated.  Time spent 25 minutes  Delorise Jackson, MD

## 2019-04-10 ENCOUNTER — Ambulatory Visit (INDEPENDENT_AMBULATORY_CARE_PROVIDER_SITE_OTHER): Payer: Medicare Other

## 2019-04-10 ENCOUNTER — Other Ambulatory Visit: Payer: Self-pay

## 2019-04-10 ENCOUNTER — Other Ambulatory Visit: Payer: Self-pay | Admitting: Internal Medicine

## 2019-04-10 ENCOUNTER — Other Ambulatory Visit (INDEPENDENT_AMBULATORY_CARE_PROVIDER_SITE_OTHER): Payer: Medicare Other

## 2019-04-10 VITALS — HR 64 | Wt 100.8 lb

## 2019-04-10 DIAGNOSIS — R110 Nausea: Secondary | ICD-10-CM | POA: Diagnosis not present

## 2019-04-10 DIAGNOSIS — R079 Chest pain, unspecified: Secondary | ICD-10-CM | POA: Diagnosis not present

## 2019-04-10 DIAGNOSIS — R109 Unspecified abdominal pain: Secondary | ICD-10-CM | POA: Diagnosis not present

## 2019-04-10 DIAGNOSIS — R0781 Pleurodynia: Secondary | ICD-10-CM

## 2019-04-10 DIAGNOSIS — R1013 Epigastric pain: Secondary | ICD-10-CM

## 2019-04-10 DIAGNOSIS — R17 Unspecified jaundice: Secondary | ICD-10-CM

## 2019-04-10 DIAGNOSIS — R634 Abnormal weight loss: Secondary | ICD-10-CM

## 2019-04-10 DIAGNOSIS — R0782 Intercostal pain: Secondary | ICD-10-CM | POA: Diagnosis not present

## 2019-04-10 DIAGNOSIS — R748 Abnormal levels of other serum enzymes: Secondary | ICD-10-CM

## 2019-04-10 LAB — COMPREHENSIVE METABOLIC PANEL
ALT: 13 U/L (ref 0–35)
AST: 17 U/L (ref 0–37)
Albumin: 4.5 g/dL (ref 3.5–5.2)
Alkaline Phosphatase: 67 U/L (ref 39–117)
BUN: 15 mg/dL (ref 6–23)
CO2: 30 mEq/L (ref 19–32)
Calcium: 9.7 mg/dL (ref 8.4–10.5)
Chloride: 103 mEq/L (ref 96–112)
Creatinine, Ser: 0.86 mg/dL (ref 0.40–1.20)
GFR: 64.52 mL/min (ref >60.00)
Glucose, Bld: 89 mg/dL (ref 70–99)
Potassium: 4.1 mEq/L (ref 3.5–5.1)
Sodium: 140 mEq/L (ref 135–145)
Total Bilirubin: 1.6 mg/dL — ABNORMAL HIGH (ref 0.2–1.2)
Total Protein: 6.4 g/dL (ref 6.0–8.3)

## 2019-04-10 LAB — CBC WITH DIFFERENTIAL/PLATELET
Basophils Absolute: 0 10*3/uL (ref 0.0–0.1)
Basophils Relative: 0.7 % (ref 0.0–3.0)
Eosinophils Absolute: 0.1 10*3/uL (ref 0.0–0.7)
Eosinophils Relative: 1.9 % (ref 0.0–5.0)
HCT: 43.8 % (ref 36.0–46.0)
Hemoglobin: 15 g/dL (ref 12.0–15.0)
Lymphocytes Relative: 27.2 % (ref 12.0–46.0)
Lymphs Abs: 1.4 10*3/uL (ref 0.7–4.0)
MCHC: 34.3 g/dL (ref 30.0–36.0)
MCV: 95.7 fl (ref 78.0–100.0)
Monocytes Absolute: 0.4 10*3/uL (ref 0.1–1.0)
Monocytes Relative: 8.7 % (ref 3.0–12.0)
Neutro Abs: 3.1 10*3/uL (ref 1.4–7.7)
Neutrophils Relative %: 61.5 % (ref 43.0–77.0)
Platelets: 288 10*3/uL (ref 150.0–400.0)
RBC: 4.58 Mil/uL (ref 3.87–5.11)
RDW: 13.3 % (ref 11.5–15.5)
WBC: 5 10*3/uL (ref 4.0–10.5)

## 2019-04-10 LAB — LIPASE: Lipase: 62 U/L — ABNORMAL HIGH (ref 11.0–59.0)

## 2019-04-10 LAB — TSH: TSH: 2.75 u[IU]/mL (ref 0.35–4.50)

## 2019-04-10 LAB — TROPONIN I: TNIDX: 0 ug/l (ref 0.00–0.06)

## 2019-04-10 NOTE — Progress Notes (Signed)
Patient presented today for EKG per MD order on 04/09/19.  EKG report reviewed by PCP.

## 2019-04-14 ENCOUNTER — Other Ambulatory Visit: Payer: Self-pay

## 2019-04-14 ENCOUNTER — Ambulatory Visit
Admission: RE | Admit: 2019-04-14 | Discharge: 2019-04-14 | Disposition: A | Discharge status: Home / Self Care | Payer: Medicare Other | Source: Ambulatory Visit | Attending: Internal Medicine | Admitting: Internal Medicine

## 2019-04-14 DIAGNOSIS — K8689 Other specified diseases of pancreas: Secondary | ICD-10-CM | POA: Diagnosis not present

## 2019-04-14 DIAGNOSIS — R1013 Epigastric pain: Secondary | ICD-10-CM | POA: Insufficient documentation

## 2019-04-14 DIAGNOSIS — R634 Abnormal weight loss: Secondary | ICD-10-CM | POA: Insufficient documentation

## 2019-04-14 DIAGNOSIS — R17 Unspecified jaundice: Secondary | ICD-10-CM | POA: Diagnosis not present

## 2019-04-14 DIAGNOSIS — R748 Abnormal levels of other serum enzymes: Secondary | ICD-10-CM | POA: Insufficient documentation

## 2019-04-14 MED ORDER — IOHEXOL 300 MG/ML  SOLN
75.0000 mL | Freq: Once | INTRAMUSCULAR | Status: AC | PRN
  Administered 2019-04-14: 14:00:00 75 mL via INTRAVENOUS

## 2019-04-15 ENCOUNTER — Other Ambulatory Visit: Payer: Self-pay | Admitting: Internal Medicine

## 2019-04-15 DIAGNOSIS — R110 Nausea: Secondary | ICD-10-CM

## 2019-04-15 DIAGNOSIS — K8689 Other specified diseases of pancreas: Secondary | ICD-10-CM

## 2019-04-15 MED ORDER — ONDANSETRON HCL 4 MG PO TABS: 4.0000 mg | ORAL_TABLET | Freq: Three times a day (TID) | ORAL | 0 refills | Status: DC | PRN

## 2019-04-18 ENCOUNTER — Telehealth: Payer: Self-pay | Admitting: Internal Medicine

## 2019-04-18 NOTE — Telephone Encounter (Signed)
Patient does not want to take the medication. Prefers to do a more natural approach.  Patient will try some other thing/deal with the sx.

## 2019-04-18 NOTE — Telephone Encounter (Signed)
Evelyn Barker

## 2019-04-18 NOTE — Telephone Encounter (Signed)
Does she want to try a medication for nausea in the am?  [place]

## 2019-04-23 ENCOUNTER — Telehealth: Payer: Self-pay | Admitting: *Deleted

## 2019-04-23 NOTE — Telephone Encounter (Signed)
Copied from [place]. Topic: MyChart - Activation >> Apr 23, 2019 10:52 AM Virl Axe D wrote: Pt would like to have Fransisco Beau give her a call to help with Mychart activation. She stated he helped her the other day. She needs an activation code.

## 2019-04-23 NOTE — Telephone Encounter (Signed)
Patient has been notified of password and login ID

## 2019-05-04 DIAGNOSIS — K862 Cyst of pancreas: Secondary | ICD-10-CM | POA: Diagnosis not present

## 2019-05-07 DIAGNOSIS — H04123 Dry eye syndrome of bilateral lacrimal glands: Secondary | ICD-10-CM | POA: Diagnosis not present

## 2019-05-08 DIAGNOSIS — K862 Cyst of pancreas: Secondary | ICD-10-CM | POA: Diagnosis not present

## 2019-05-23 ENCOUNTER — Ambulatory Visit (INDEPENDENT_AMBULATORY_CARE_PROVIDER_SITE_OTHER): Payer: Medicare Other | Admitting: Internal Medicine

## 2019-05-23 ENCOUNTER — Other Ambulatory Visit: Payer: Self-pay

## 2019-05-23 DIAGNOSIS — K921 Melena: Secondary | ICD-10-CM

## 2019-05-23 DIAGNOSIS — R1013 Epigastric pain: Secondary | ICD-10-CM

## 2019-05-23 DIAGNOSIS — K862 Cyst of pancreas: Secondary | ICD-10-CM | POA: Diagnosis not present

## 2019-05-23 DIAGNOSIS — R634 Abnormal weight loss: Secondary | ICD-10-CM

## 2019-05-23 NOTE — Progress Notes (Signed)
Virtual Visit via Video Note  I connected with Evelyn Barker   on 05/23/19 at  1:39 PM EDT by a video enabled telemedicine application and verified that I am speaking with the correct person using two identifiers.  Location patient: home Location provider:work  Persons participating in the virtual visit: patient, provider  I discussed the limitations of evaluation and management by telemedicine and the availability of in person appointments. The patient expressed understanding and agreed to proceed.   HPI: 1. Pt still c/o weight loss weight is 96.6 lbs, abdominal bloating less and left upper quadrant/epigastric pain slightly improved. She is losing weight despite eating 3 meals a day. She also c/o dark stools yesterday was last stool 05/22/2019, She tried peptobismol last week   2. Pancreatitic cyst [facility] rec MRCP/MRI in 6 months with local GI MD 04/14/19 IMPRESSION: 1. A 1 cubic cm fluid density lesion along the upper margin of pancreatic head is present. Given the sharp definition of this lesion, am skeptical of pancreatic adenocarcinoma. More likely possibilities include a postinflammatory pseudocyst or intraductal papillary mucinous neoplasm. Based on lesions size, follow up pancreatic protocol imaging is recommended in 6 months time, or alternatively if a more aggressive approach is desired, endoscopic ultrasound with fine-needle aspiration could be employed. This recommendation follows ACR consensus guidelines: Management of Incidental Pancreatic Cysts: A White Paper of the ACR Incidental Findings Committee. [place] 4854;62:[id number]. 2.  Prominent stool throughout the colon favors constipation. 3.  Aortic Atherosclerosis (ICD10-I70.0). 4. 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  ROS: See pertinent positives and negatives per HPI.  Past Medical History:  Diagnosis Date  . Anxiety    knees, low back   . Arthritis   . History of chicken pox   . History of  macular degeneration   . History of shingles    x3   . Osteoporosis   . Osteoporosis    declines medication   . 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 ABDOMINAL HYSTERECTOMY     DUB, endometriosis no h/o abnormal pap   . APPENDECTOMY    . COLONOSCOPY N/A 04/30/2015   Procedure: COLONOSCOPY;  Surgeon: Josefine Class, MD;  Location: [facility] ENDOSCOPY;  Service: Endoscopy;  Laterality: N/A;  . TONSILLECTOMY      Family History  Problem Relation Age of Onset  . Stroke Mother   . Cancer Father        prostate   . Pneumonia Maternal Grandmother   . Hip fracture Maternal Grandmother   . Stroke Paternal Grandfather   . Skin cancer Other   . 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  aspirin 325 MG tablet, Take 325 mg by mouth every 6 (six) hours as needed for headache., Disp: , Rfl:  .  Glucosamine-Chondroitin (GLUCOSAMINE CHONDR COMPLEX PO), Take by mouth., Disp: , Rfl:  .  ibuprofen (ADVIL,MOTRIN) 200 MG tablet, Take 200 mg by mouth 2 (two) times daily as needed for moderate pain., Disp: , Rfl:  .  metroNIDAZOLE (METROGEL)  1 % gel, Apply topically daily. To face, Disp: 60 g, Rfl: 12 .  Multiple Vitamin (MULTIVITAMIN WITH MINERALS) TABS tablet, Take 1 tablet by mouth every other day., Disp: , Rfl:  .  multivitamin-lutein (OCUVITE-LUTEIN) CAPS capsule, Take 1 capsule by mouth every other day., Disp: , Rfl:  .  ondansetron (ZOFRAN) 4 MG tablet, Take 1 tablet (4 mg total) by mouth every 8 (eight) hours as needed for nausea or vomiting., Disp: 30 tablet, Rfl: 0 .  pantoprazole (PROTONIX) 20 MG tablet, Take 1 tablet  (20 mg total) by mouth daily. 30 min before breakfast, Disp: 30 tablet, Rfl: 0 .  triamcinolone cream (KENALOG) 0.1 %, Apply 1 application topically 2 (two) times daily. Right upper back as needed, Disp: 45 g, Rfl: 0  EXAM:  VITALS per patient if applicable:  GENERAL: alert, oriented, appears well and in no acute distress  HEENT: atraumatic, conjunttiva clear, no obvious abnormalities on inspection of external nose and ears  NECK: normal movements of the head and neck  LUNGS: on inspection no signs of respiratory distress, breathing rate appears normal, no obvious gross SOB, gasping or wheezing  CV: no obvious cyanosis  MS: moves all visible extremities without noticeable abnormality  PSYCH/NEURO: pleasant and cooperative, no obvious depression or anxiety, speech and thought processing grossly intact  ASSESSMENT AND PLAN:  Discussed the following assessment and plan:  Black stool - Weight loss - Pancreatic cyst -  Epigastric pain -   -will call back with referral to [facility] in [place] wants female  Needs MRI/MRCP in 6 months 10/2019 for f/u pancreatic cyst   HM Flu shot had 10/06/18 [facility]  pna 23 had 08/10/10, rec prevnardisc today pt will think about it Given Rx shingrix  Tdap had 10/11/12  zostervax had   Never smoker  No need for pap s/p hysterectomy DUB, endometriosis  Mammogram due 02/20/2019 order in -pt to call to sch  A1C 5.6  dexa had 11/15/2017 +osteoporosis -2.9 left femur will repeat -Pt never took fosamax disc proliatoday declines 2/2 c/w side effects -order in will need to schedule   Colonoscopy h/o polyp tubular 05/03/15 repeat in 5 years 2021 fobt neg 11/24/[facility]   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  The patient was provided an opportunity to ask questions and all were answered. The patient agreed with the plan and demonstrated an understanding of the instructions.   The patient was advised to call back or seek an in-person evaluation if the symptoms worsen or if the condition fails to improve as anticipated.  Time spent 15 minutes  Delorise Jackson, MD

## 2019-05-23 NOTE — Patient Instructions (Signed)
Please call to schedule your mammogram   Bloody Diarrhea Bloody diarrhea is frequent loose and watery bowel movements that contain blood. The blood can be hard to see or notice (occult). Bloody diarrhea may be caused by medical conditions such as:  Ulcerative colitis.  Crohn's disease.  Intestinal infection.  Viral gastroenteritis or bacterial gastroenteritis. Finding out why there is blood in your diarrhea is necessary so that your health care provider can prescribe the right treatment for you. Follow the instructions from your health care provider about treating the cause of your bloody diarrhea. Any type of diarrhea can make you feel weak and dehydrated. Dehydration can make you tired and thirsty, cause you to have a dry mouth, and decrease how often you urinate. Follow these instructions at home: Eating and drinking     Follow these recommendations as told by your health care provider:  Take an oral rehydration solution (ORS). This is an over-the-counter medicine that helps return your body to its normal balance of nutrients and water. It is found at pharmacies and retail stores.  Drink enough fluid to keep your urine pale yellow. ? Drink fluids such as water, ice chips, diluted fruit juice, and low-calorie sports drinks. You can also drink milk products, if desired. ? Avoid drinking fluids that contain a lot of sugar or caffeine, such as energy drinks, regular sports drinks, and soda. ? Avoid alcohol.  Eat bland, easy-to-digest foods in small amounts as you are able. These foods include bananas, applesauce, rice, lean meats, toast, and crackers.  Avoid spicy or fatty foods.  Medicines  Take over-the-counter and prescription medicines only as told by your health care provider. ? Your health care provider may prescribe medicine to slow down the frequency of diarrhea or to ease stomach discomfort.  If you were prescribed an antibiotic medicine, take it as told by your health  care provider. Do not stop using the antibiotic even if you start to feel better. General instructions   Wash your hands often using soap and water. If soap and water are not available, use a hand sanitizer. Others in the household should wash their hands as well. Hands should be washed: ? After using the toilet or changing a diaper. ? Before preparing, cooking, or serving food. ? While caring for a sick person or while visiting someone in a hospital.  Rest at home while you recover.  Take a warm bath to relieve any burning or pain from frequent diarrhea episodes.  Watch your condition for any changes.  Keep all follow-up visits as told by your health care provider. This is important. Contact a health care provider if:  You have a fever.  Your diarrhea gets worse.  You have new symptoms.  You cannot keep fluids down.  You feel light-headed or dizzy.  You have a headache.  You have muscle cramps. Get help right away if:  You have chest pain.  You feel extremely weak or you faint.  The blood in your diarrhea increases or turns a different color.  You vomit and the vomit is bloody or looks black.  You have persistent diarrhea.  You have severe pain, cramping, or bloating in your abdomen.  You have trouble breathing or you are breathing very quickly.  Your heart is beating very quickly.  Your skin feels cold and clammy.  You feel confused.  You have signs of dehydration, such as: ? Dark urine, very little urine, or no urine. ? Cracked lips. ? Dry mouth. ?  Sunken eyes. ? Sleepiness. ? Weakness. Summary  Bloody diarrhea is frequent loose and watery bowel movements that contain blood. The blood can be hard to see or notice (occult).  Follow the instructions from your health care provider about treating the cause of your bloody diarrhea.  Any type of diarrhea can make you feel weak and dehydrated.  Follow your health care provider's recommendations for  eating and drinking and for taking medicines.  Contact your health care provider if your symptoms get worse. Get help right away if you have signs of dehydration. This information is not intended to replace advice given to you by your health care provider. Make sure you discuss any questions you have with your health care provider. Document Released: 11/27/2005 Document Revised: 05/09/2018 Document Reviewed: 05/09/2018 Elsevier Interactive Patient Education  2019 [employer].

## 2019-06-02 DIAGNOSIS — K760 Fatty (change of) liver, not elsewhere classified: Secondary | ICD-10-CM | POA: Diagnosis not present

## 2019-06-02 DIAGNOSIS — R17 Unspecified jaundice: Secondary | ICD-10-CM | POA: Diagnosis not present

## 2019-06-02 DIAGNOSIS — R634 Abnormal weight loss: Secondary | ICD-10-CM | POA: Diagnosis not present

## 2019-06-02 DIAGNOSIS — K921 Melena: Secondary | ICD-10-CM | POA: Diagnosis not present

## 2019-06-02 DIAGNOSIS — R1010 Upper abdominal pain, unspecified: Secondary | ICD-10-CM | POA: Diagnosis not present

## 2019-06-02 DIAGNOSIS — K862 Cyst of pancreas: Secondary | ICD-10-CM | POA: Diagnosis not present

## 2019-06-02 DIAGNOSIS — Z86010 Personal history of colonic polyps: Secondary | ICD-10-CM | POA: Insufficient documentation

## 2019-06-03 ENCOUNTER — Other Ambulatory Visit
Admission: RE | Admit: 2019-06-03 | Discharge: 2019-06-03 | Disposition: A | Discharge status: Home / Self Care | Payer: Medicare Other | Source: Ambulatory Visit | Attending: Student | Admitting: Student

## 2019-06-03 DIAGNOSIS — R634 Abnormal weight loss: Secondary | ICD-10-CM | POA: Diagnosis not present

## 2019-06-03 DIAGNOSIS — K862 Cyst of pancreas: Secondary | ICD-10-CM | POA: Insufficient documentation

## 2019-06-03 DIAGNOSIS — R1010 Upper abdominal pain, unspecified: Secondary | ICD-10-CM | POA: Diagnosis not present

## 2019-06-05 ENCOUNTER — Other Ambulatory Visit
Admission: RE | Admit: 2019-06-05 | Discharge: 2019-06-05 | Disposition: A | Discharge status: Home / Self Care | Payer: Medicare Other | Source: Ambulatory Visit | Attending: Student | Admitting: Student

## 2019-06-05 DIAGNOSIS — R1010 Upper abdominal pain, unspecified: Secondary | ICD-10-CM | POA: Insufficient documentation

## 2019-06-05 DIAGNOSIS — R634 Abnormal weight loss: Secondary | ICD-10-CM | POA: Insufficient documentation

## 2019-06-05 LAB — PANCREATIC ELASTASE, FECAL: Pancreatic Elastase-1, Stool: 495 ug Elast./g (ref >200)

## 2019-06-06 ENCOUNTER — Telehealth: Payer: Self-pay | Admitting: Internal Medicine

## 2019-06-06 ENCOUNTER — Encounter: Payer: Self-pay | Admitting: Internal Medicine

## 2019-06-06 NOTE — Telephone Encounter (Signed)
From: Evelyn Barker  Sent: 06/06/2019  1:10 PM EDT  To: [facility]  Subject: Visit Follow-Up Question               Dr. Sherin Quarry I am very concerned about my colonoscopy July 7: I feel the 8.3 oz MiraLAX is too much for my under weight 7.7 body. Will you address this concern and speak with Dr Gaylyn Cheers. to avoid a dangerous over dose of Miralax and possibly also speak with Darcel Bayley PA in Gastro-enterology department concerning this issue.  Evelyn Barker, bd: Nov 03, 1946   GI please call pt

## 2019-06-08 LAB — H. PYLORI ANTIGEN, STOOL: H. Pylori Stool Ag, Eia: NEGATIVE

## 2019-06-11 DIAGNOSIS — Z1159 Encounter for screening for other viral diseases: Secondary | ICD-10-CM | POA: Diagnosis not present

## 2019-06-11 DIAGNOSIS — R634 Abnormal weight loss: Secondary | ICD-10-CM | POA: Diagnosis not present

## 2019-06-11 DIAGNOSIS — R1010 Upper abdominal pain, unspecified: Secondary | ICD-10-CM | POA: Diagnosis not present

## 2019-06-11 DIAGNOSIS — R17 Unspecified jaundice: Secondary | ICD-10-CM | POA: Diagnosis not present

## 2019-06-17 DIAGNOSIS — K219 Gastro-esophageal reflux disease without esophagitis: Secondary | ICD-10-CM | POA: Diagnosis not present

## 2019-06-17 DIAGNOSIS — D122 Benign neoplasm of ascending colon: Secondary | ICD-10-CM | POA: Diagnosis not present

## 2019-06-17 DIAGNOSIS — D120 Benign neoplasm of cecum: Secondary | ICD-10-CM | POA: Diagnosis not present

## 2019-06-17 DIAGNOSIS — K640 First degree hemorrhoids: Secondary | ICD-10-CM | POA: Diagnosis not present

## 2019-06-17 DIAGNOSIS — K2970 Gastritis, unspecified, without bleeding: Secondary | ICD-10-CM | POA: Diagnosis not present

## 2019-06-17 DIAGNOSIS — K921 Melena: Secondary | ICD-10-CM | POA: Diagnosis not present

## 2019-06-17 DIAGNOSIS — K2950 Unspecified chronic gastritis without bleeding: Secondary | ICD-10-CM | POA: Diagnosis not present

## 2019-06-17 DIAGNOSIS — K210 Gastro-esophageal reflux disease with esophagitis: Secondary | ICD-10-CM | POA: Diagnosis not present

## 2019-06-17 DIAGNOSIS — R634 Abnormal weight loss: Secondary | ICD-10-CM | POA: Diagnosis not present

## 2019-06-17 DIAGNOSIS — K635 Polyp of colon: Secondary | ICD-10-CM | POA: Diagnosis not present

## 2019-06-17 DIAGNOSIS — R1010 Upper abdominal pain, unspecified: Secondary | ICD-10-CM | POA: Diagnosis not present

## 2019-07-01 DIAGNOSIS — R1010 Upper abdominal pain, unspecified: Secondary | ICD-10-CM | POA: Diagnosis not present

## 2019-07-01 DIAGNOSIS — K210 Gastro-esophageal reflux disease with esophagitis: Secondary | ICD-10-CM | POA: Diagnosis not present

## 2019-07-01 DIAGNOSIS — R634 Abnormal weight loss: Secondary | ICD-10-CM | POA: Diagnosis not present

## 2019-07-01 DIAGNOSIS — K862 Cyst of pancreas: Secondary | ICD-10-CM | POA: Diagnosis not present

## 2019-07-01 DIAGNOSIS — K760 Fatty (change of) liver, not elsewhere classified: Secondary | ICD-10-CM | POA: Diagnosis not present

## 2019-07-01 DIAGNOSIS — R17 Unspecified jaundice: Secondary | ICD-10-CM | POA: Diagnosis not present

## 2019-07-23 ENCOUNTER — Other Ambulatory Visit: Payer: Self-pay

## 2019-07-23 ENCOUNTER — Ambulatory Visit (INDEPENDENT_AMBULATORY_CARE_PROVIDER_SITE_OTHER): Payer: Medicare Other | Admitting: Internal Medicine

## 2019-07-23 DIAGNOSIS — Z1231 Encounter for screening mammogram for malignant neoplasm of breast: Secondary | ICD-10-CM | POA: Diagnosis not present

## 2019-07-23 DIAGNOSIS — R1013 Epigastric pain: Secondary | ICD-10-CM | POA: Diagnosis not present

## 2019-07-23 DIAGNOSIS — K219 Gastro-esophageal reflux disease without esophagitis: Secondary | ICD-10-CM

## 2019-07-23 DIAGNOSIS — R634 Abnormal weight loss: Secondary | ICD-10-CM | POA: Diagnosis not present

## 2019-07-23 DIAGNOSIS — K8689 Other specified diseases of pancreas: Secondary | ICD-10-CM | POA: Diagnosis not present

## 2019-07-23 NOTE — Progress Notes (Addendum)
Virtual Visit via Video Note  I connected with Evelyn Barker  on 07/23/19 at 11:00 AM EDT by a video enabled telemedicine application and verified that I am speaking with the correct person using two identifiers.  Location patient: home Location provider:work or home office Persons participating in the virtual visit: patient, provider  I discussed the limitations of evaluation and management by telemedicine and the availability of in person appointments. The patient expressed understanding and agreed to proceed.   HPI: 1. F/u weight loss patients wt overall still low 96.4-97.6 lbs but has leveled off and she reports she can see the bones in her spine despite drinking 1 premier protein shake a day and eating normal meals (I.e today will have pasta salad and meat loaf), She also is still having epigastric and left upper abdomen abdominal pain. Denies night sweats, fever, diarrhea, jt pain, h/a or depression. 06/17/19 EGD and colonoscopy +GERD colonoscopy 4 diminutive polyps and grade 1 IH.She does have pancreatitic mass and is due to have repeat MRI/MRCP 11/10/19 with [facility] Dr. 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 H pylori was negative. She can tolerate foods all foods and does not think she is sensitive to gluten. Celiac labs 06/11/19 negative. Nutrition appt 07/28/2019 . Hepatitis labs hep A + 01/07/19 total, hep B panel negative, hep C negative 01/07/19   She reports even her neighbors are concerned with her wt loss and 1 had lyme or erhlicia tick borne illness of not feeling well     ROS: See pertinent positives and negatives per HPI.  Past Medical History:  Diagnosis Date  . Anxiety    knees, low back   . Arthritis   . History of chicken pox   . History of macular degeneration   . History of shingles    x3   . Osteoporosis   . Osteoporosis    declines medication   . Seropositive for  Lyme disease    B Burg antibodies +    Past Surgical History:  Procedure Laterality Date  . ABDOMINAL HYSTERECTOMY     DUB, endometriosis no h/o abnormal pap   . APPENDECTOMY    . COLONOSCOPY N/A 04/30/2015   Procedure: COLONOSCOPY;  Surgeon: Josefine Class, MD;  Location[facility] ENDOSCOPY;  Service: Endoscopy;  Laterality: N/A;  . TONSILLECTOMY      Family History  Problem Relation Age of Onset  . Stroke Mother   . Cancer Father        prostate   . Pneumonia Maternal Grandmother   . Hip fracture Maternal Grandmother   . Stroke Paternal Grandfather   . Skin cancer Other   . Breast cancer Neg Hx    SOCIAL HX: lives alone    Current Outpatient Medications:  .  aspirin 325 MG tablet, Take 325 mg by mouth every 6 (six) hours as needed for headache., Disp: , Rfl:  .  Glucosamine-Chondroitin (GLUCOSAMINE CHONDR COMPLEX PO), Take by mouth., Disp: , Rfl:  .  ibuprofen (ADVIL,MOTRIN) 200 MG tablet, Take 200 mg by mouth 2 (two) times daily as needed for moderate pain., Disp: , Rfl:  .  metroNIDAZOLE (METROGEL) 1 % gel, Apply topically daily. To face (Patient not taking: Reported on 07/28/2019), Disp: 60 g, Rfl: 12 .  Multiple Vitamin (MULTIVITAMIN WITH MINERALS) TABS tablet, Take 1 tablet by mouth every other day., Disp: , Rfl:  .  multivitamin-lutein (OCUVITE-LUTEIN) CAPS  capsule, Take 1 capsule by mouth every other day., Disp: , Rfl:  .  triamcinolone cream (KENALOG) 0.1 %, Apply 1 application topically 2 (two) times daily. Right upper back as needed, Disp: 45 g, Rfl: 0 .  omeprazole (PRILOSEC) 40 MG capsule, Take 40 mg by mouth daily., Disp: , Rfl:   EXAM:  VITALS per patient if applicable:  GENERAL: alert, oriented, appears well and in no acute distress. Cachetic body habitus appears frail   HEENT: atraumatic, conjunttiva clear, no obvious abnormalities on inspection of external nose and ears  NECK: normal movements of the head and neck  LUNGS: on inspection no signs of  respiratory distress, breathing rate appears normal, no obvious gross SOB, gasping or wheezing  CV: no obvious cyanosis  MS: moves all visible extremities without noticeable abnormality  PSYCH/NEURO: pleasant and cooperative, no obvious depression or anxiety, speech and thought processing grossly intact  ASSESSMENT AND PLAN:  Discussed the following assessment and plan:  Abnormal Weight loss with epigastric and LUQ ab pain with +pancreatic lesion though unclear etiology see HPI so far w/u negative- Plan: Ova and parasite examination, NM PET Image Initial (PI) Skull Base To Thigh further characterize pancreatic lesion  MRI/MRCP 11/10/19 [facility] Dr. Francella Solian scheduled  -if w/u negative consider cortisol am in future celiac w/u already negative and colonoscopy EGD 06/17/19 unrevealing for etiology of sx's  CXR 04/10/19 negative  -check stool ova and parasites  -continue premier protein shakes otc    HM Flu shot had 10/06/18 [facility]  pna 23 had 09/11/15 prevnardisc prev. pt will think about it Given Rx shingrix prev  Tdap had 10/11/12  zostervax had   Never smoker  No need for pap s/p hysterectomy DUB, endometriosis  Mammogram due 02/20/2019 order in -pt to call to schordered  A1C 5.6 dexa had 11/15/2017 +osteoporosis -2.9 left femur will repeatordered  -Pt never took fosamax disc proliaprev.  declines 2/2 c/w side effects -order in will need to schedulein future   Colonoscopy h/o polyp tubular 05/03/15  Had repeat EGD/colonoscopy 06/17/2019 [facility] Dr. 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 The patient was provided an opportunity to ask questions and all were answered. The patient agreed with the plan and demonstrated an understanding of the instructions.   The patient was advised to call back or seek an in-person evaluation if the symptoms worsen or if the condition fails to improve as anticipated.  Time spent 20 minutes  Delorise Jackson, MD

## 2019-07-24 ENCOUNTER — Telehealth: Payer: Self-pay | Admitting: Internal Medicine

## 2019-07-24 NOTE — Telephone Encounter (Signed)
Patient calling stating that she will come by this afternoon and pick up stool testing. She will call when she is outside. Thank you

## 2019-07-24 NOTE — Telephone Encounter (Signed)
Please get stool O&P together and place at the front  Thank you

## 2019-07-25 ENCOUNTER — Telehealth: Payer: Self-pay | Admitting: Dietician

## 2019-07-25 NOTE — Telephone Encounter (Signed)
Returned voicemail message from patient to cancel her MNT appointment for 07/28/19 as she is having some symptoms she fears could be COVID-19. She would like to wait another week before rescheduling the appointment.   Will call patient back in one week.

## 2019-07-28 ENCOUNTER — Encounter: Payer: Medicare Other | Attending: Student | Admitting: Dietician

## 2019-07-28 ENCOUNTER — Other Ambulatory Visit: Payer: Self-pay

## 2019-07-28 ENCOUNTER — Ambulatory Visit: Payer: Medicare Other | Admitting: Dietician

## 2019-07-28 VITALS — Ht 64.5 in | Wt 96.9 lb

## 2019-07-28 DIAGNOSIS — R634 Abnormal weight loss: Secondary | ICD-10-CM | POA: Diagnosis not present

## 2019-07-28 DIAGNOSIS — K219 Gastro-esophageal reflux disease without esophagitis: Secondary | ICD-10-CM | POA: Insufficient documentation

## 2019-07-28 NOTE — Patient Instructions (Signed)
   Make sure to eat something every 3-4 hours during the day. Include 3 meals and at least 2 snacks (afternoon, evening) daily. Can count a protein drink as a snack, and it's ok to have a serving of fruit or graham crackers or ginger cookie with the drink also.   Add a little extra healthy fat to foods, such as soft margarine, mayonnaise, salad dressing, or olive oil. Nuts and nut butter (peanut butter, almond butter).   Include walking or other light exercise regularly to stimulate hunger and appetite.

## 2019-07-28 NOTE — Progress Notes (Signed)
Medical Nutrition Therapy: Visit start time: 1330  end time: 1430  Assessment:  Diagnosis: abnormal weight loss Past medical history: peptic ulcers Psychosocial issues/ stress concerns: patient reports moderate stress level; some increased stress with pandemic  Preferred learning method:  . Auditory . Visual   Current weight: 96.9lbs with shoes  Height: 5'4.5" Medications, supplements: reconciled list in medical record  Progress and evaluation:   Patient reports steady weight recently of 96-97lbs. Feels best when weighing 115-125lbs, was at that weight about 10 years ago or more.   Used to garden and was participating in water exercise classes with a friend for several years, but stopped when friend stopped. She reports loss of muscle and strength since then.   Began losing weight 6-7 years ago when selling her house, due to increase in physical activity and stress.    Physical activity: none at this time  Dietary Intake:  Usual eating pattern includes 3 meals and 1 snacks per day. Dining out frequency: 0 meals per week.  Breakfast: oatmeal, cream of wheat, cereal, eggs and grits Snack: none Lunch: [employer] -- 0/98-- 2 sm slices beef tenderloin, creamed spinach, few bites berry compote; usually unable to eat entire serving of pasta, but does eat entire veg serving; sometimes leftovers Snack: nuts-- pecans; sunflower seeds, almonds, walnuts, [place] nuts; fruit some with whole milk yogurt; peach with cottage cheese, or cucumber; sometimes none if busy Supper: [employer]: none or thin cookies with yogurt, or ice cream (on advice from neighbor to boost calories) Beverages: water 1-2 bottles daily (24-case lasts 3 weeks) 2c in am + 3 9-oz in pm black or green tea with honey and lemonade, some soda diet or regular  Nutrition Care Education: Topics covered: weight gain Basic nutrition: basic food groups, appropriate nutrient balance, appropriate meal  and snack schedule, general nutrition guidelines    Weight control/ gain: importance of eating all meals and some snacks daily at about 3-hour intervals; including high-calorie foods/ condiments; importance of adequate protein; factors affecting appetite and hunger and fulness; options for nutritional/ protein drinks; using activity/ exercise for boosting appetite   Nutritional Diagnosis:  [place]-3.1 Underweight As related to inadequate caloric intake.  As evidenced by patient with current BMI of 16.38.  Intervention:   Instruction and discussion as noted above.  Patient is making healthy food choices and her diet is likely adequate in protein at this time.   Patient voices readiness and motivation to make dietary changes to increase weight along with strength and energy.   Established goals with input from patient.   Patient declined scheduling follow-up at this time; she will schedule later if needed.   Education Materials given:  . Plate Planner with food lists . Suggestions for Increasing Calories and Protein (NCM) . Goals/ instructions   Learner/ who was taught:  . Patient    Level of understanding: Marland Kitchen Verbalizes/ demonstrates competency   Demonstrated degree of understanding via:   Teach back Learning barriers: . None   Willingness to learn/ readiness for change: . Eager, change in progress   Monitoring and Evaluation:  Dietary intake, exercise, and body weight      follow up: prn

## 2019-07-29 ENCOUNTER — Encounter: Payer: Self-pay | Admitting: Internal Medicine

## 2019-07-30 ENCOUNTER — Telehealth: Payer: Self-pay | Admitting: Internal Medicine

## 2019-07-30 NOTE — Telephone Encounter (Signed)
Call [facility] I want screening mammogram  Not diagnostic schedule and inform pt please  [place]

## 2019-08-04 ENCOUNTER — Telehealth: Payer: Self-pay | Admitting: Internal Medicine

## 2019-08-04 ENCOUNTER — Other Ambulatory Visit: Payer: Medicare Other

## 2019-08-04 ENCOUNTER — Telehealth: Payer: Self-pay

## 2019-08-04 ENCOUNTER — Encounter: Payer: Self-pay | Admitting: Internal Medicine

## 2019-08-04 ENCOUNTER — Other Ambulatory Visit: Payer: Self-pay | Admitting: Internal Medicine

## 2019-08-04 ENCOUNTER — Other Ambulatory Visit: Payer: Self-pay

## 2019-08-04 DIAGNOSIS — R634 Abnormal weight loss: Secondary | ICD-10-CM

## 2019-08-04 NOTE — Telephone Encounter (Signed)
Not sure why this was sent to three people since it creates double work, but this patient came in the office with two tubes labeled by another office with another doctors name. She told me that they told her that she could drop them here. I told here that I did not have any orders for another provider. She stated that she had to go by there office when she left here today to find out about her CT, so I gave her back the tubes to take back to the provider that ordered them. Our Ova & Parasite test requires a black top tube. Pt brought in two different tubes (one Pink & one Grey). But as mentioned before, this patient that another office ordered those test & labeled the tubes. I had no orders for another office & those tubes can not be ran through [facility].

## 2019-08-04 NOTE — Telephone Encounter (Signed)
Copied from [place] [id number]. Topic: General - Other >> Aug 04, 2019  2:33 PM Rainey Pines A wrote: Patient would like a callback from Dr. Olivia Mackie nurse in regards to her mychart message about stool sample.

## 2019-08-04 NOTE — Telephone Encounter (Signed)
No she was supposed to have a tube for ova and parasites only so this means someone gave her the wrong tube and likely we need to re collect for the future  -likely she needs to be called back and called to get ova and parasites collected and apologize to her about the confusion    I sent to 3 people due to not sure who was in the office today and who is handling my work since brock is off I believe and this needs to be addressed today

## 2019-08-04 NOTE — Telephone Encounter (Signed)
Please call hospital lab and make sure they can see the order for stool ova and parasites   See message below   Thanks Kyle Dr. Jacklynn Lewis, Didn't you order a stool specimen for  for me at our last visual appointment on Aug 12,2020 to test for parasites. I was given the two vials witth liquid inside at your [facility] and I was specificly told to bring the two test vials of specimen back to the [facility] clinic.  This morning I finally was able to produce an acceptable stool.  So I packaged it according to the directions and took it to the [facility] clinic.  The lab technition said she had never seen that type before and did not know what to do with it. She told me to take it to the hospital, so I took it to the hospital lab.  They could not find any order for it in my chart.  So please let me know if you ordered this special parasite stool sample and what I shall do with it. It might not be acceptable beyond today.   from Evelyn Barker  bd: [phone]

## 2019-08-04 NOTE — Addendum Note (Signed)
Addended by: Leeanne Rio on: 08/04/2019 05:01 PM   Modules accepted: Orders

## 2019-08-04 NOTE — Telephone Encounter (Signed)
I have spoken with patient & she brought the stool specimens back to our office. I changed the order to [facility]. But patient stated again that she got the tubes from GI, not our office & they labeled them for her. That why this whole thing was confusing because we don't use those tube for that particular test. But [facility] will accept them. However, it may take longer to result

## 2019-08-06 ENCOUNTER — Other Ambulatory Visit: Payer: Self-pay

## 2019-08-06 ENCOUNTER — Encounter
Admission: RE | Admit: 2019-08-06 | Discharge: 2019-08-06 | Disposition: A | Discharge status: Home / Self Care | Payer: Medicare Other | Source: Ambulatory Visit | Attending: Internal Medicine | Admitting: Internal Medicine

## 2019-08-06 DIAGNOSIS — R1013 Epigastric pain: Secondary | ICD-10-CM | POA: Diagnosis not present

## 2019-08-06 DIAGNOSIS — R634 Abnormal weight loss: Secondary | ICD-10-CM | POA: Insufficient documentation

## 2019-08-06 DIAGNOSIS — K8689 Other specified diseases of pancreas: Secondary | ICD-10-CM | POA: Insufficient documentation

## 2019-08-06 LAB — GLUCOSE, CAPILLARY: Glucose-Capillary: 87 mg/dL (ref 70–99)

## 2019-08-06 MED ORDER — FLUDEOXYGLUCOSE F - 18 (FDG) INJECTION
5.7000 | Freq: Once | INTRAVENOUS | Status: AC | PRN
  Administered 2019-08-06: 5.7 via INTRAVENOUS

## 2019-08-07 ENCOUNTER — Other Ambulatory Visit: Payer: Self-pay | Admitting: Internal Medicine

## 2019-08-07 ENCOUNTER — Ambulatory Visit (LOCAL_COMMUNITY_HEALTH_CENTER): Payer: Medicare Other

## 2019-08-07 DIAGNOSIS — K862 Cyst of pancreas: Secondary | ICD-10-CM

## 2019-08-07 DIAGNOSIS — Z719 Counseling, unspecified: Secondary | ICD-10-CM

## 2019-08-07 NOTE — Progress Notes (Signed)
Client presents for third Twinrix, but only 4 months (not 5) have lapsed since second dose. Client states needed third dose 6 months after first dose. Counseled client that was correct if second dose was given one month after first dose. As she received second dose of vaccine ~ 2 months after first dose, her third dose is due 09/03/2019. Client verbalized understanding of above and plans to schedule immunization appt for that day.Per client, last received tetanus vaccine 6 years ago. Rich Number, RN

## 2019-08-08 ENCOUNTER — Telehealth: Payer: Self-pay | Admitting: Dietician

## 2019-08-08 ENCOUNTER — Ambulatory Visit: Payer: Self-pay | Admitting: *Deleted

## 2019-08-08 LAB — SPECIMEN STATUS REPORT

## 2019-08-08 LAB — OVA AND PARASITE EXAMINATION

## 2019-08-08 NOTE — Telephone Encounter (Signed)
Ok if dizziness worsens over weekend please go to urgent care or the ED   Increase water intake  TMS

## 2019-08-08 NOTE — Telephone Encounter (Signed)
Patient called to ask about whether protein powder supplements are a good option for nutrition. Discussed ways to use protein powder to boost caloric content of foods and to ensure adequate daily caloric or protein intake.

## 2019-08-08 NOTE — Telephone Encounter (Addendum)
Patient has new onset dizziness that started this morning- she is doing some better now and it is not as severe. Patient wants to check her BP/P and she is going to get a friend to help her with that- she request call back in 15 minutes. BP- 135/85 P 69 Patient states walking outside helped her head tremendously she feels better. Patient does not believe that she needs an appointment at this time. Patient encouraged to call back for dizziness- continue to monitor BP/P, hydrate, watch for other symptoms. Patient states she will and she will go to UC if she has occurrence over the weekend. Patient is aware note will be sent to PCP and they want to follow up with her in the near future.  Answer Assessment - Initial Assessment Questions 1. DESCRIPTION: "Describe your dizziness."     Patient states it started this morning- she had to hold on to things- it is a little better. 2. LIGHTHEADED: "Do you feel lightheaded?" (e.g., somewhat faint, woozy, weak upon standing)     Head feel heavy 3. VERTIGO: "Do you feel like either you or the room is spinning or tilting?" (i.e. vertigo)     no 4. SEVERITY: "How bad is it?"  "Do you feel like you are going to faint?" "Can you stand and walk?"   - MILD - walking normally   - MODERATE - interferes with normal activities (e.g., work, school)    - SEVERE - unable to stand, requires support to walk, feels like passing out now.      Mild/moderate 5. ONSET:  "When did the dizziness begin?"     This morning 6. AGGRAVATING FACTORS: "Does anything make it worse?" (e.g., standing, change in head position)     No really 7. HEART RATE: "Can you tell me your heart rate?" "How many beats in 15 seconds?"  (Note: not all patients can do this)       No change noted 8. CAUSE: "What do you think is causing the dizziness?"     Prilosec- sensitive to medication- mid June- stopped medication because it seemed to upset stomach. Patient had stopped medication and restarted today 9.  RECURRENT SYMPTOM: "Have you had dizziness before?" If so, ask: "When was the last time?" "What happened that time?"     Vertigo once- different 10. OTHER SYMPTOMS: "Do you have any other symptoms?" (e.g., fever, chest pain, vomiting, diarrhea, bleeding)       no 11. PREGNANCY: "Is there any chance you are pregnant?" "When was your last menstrual period?"       n/a  Protocols used: DIZZINESS [facility]

## 2019-08-08 NOTE — Telephone Encounter (Signed)
  Reason for Disposition . [1] MODERATE dizziness (e.g., interferes with normal activities) AND [2] has NOT been evaluated by physician for this  (Exception: dizziness caused by heat exposure, sudden standing, or poor fluid intake)  Protocols used: DIZZINESS Heidi Dach

## 2019-08-11 ENCOUNTER — Telehealth: Payer: Self-pay

## 2019-08-11 NOTE — Telephone Encounter (Signed)
Left message for patient to return call back. P[facility] may give information.  

## 2019-08-11 NOTE — Telephone Encounter (Signed)
Copied from [place] [id number]. Topic: Quick Communication - See Telephone Encounter >> Aug 11, 2019  3:40 PM Babs Bertin, CMA wrote: CRM for notification. See Telephone encounter for: 08/11/19. >> Aug 11, 2019  4:01 PM Jodie Echevaria wrote: Patient returned call for [facility] asking for a call back at Ph# [phone]

## 2019-08-12 NOTE — Telephone Encounter (Signed)
Pt still awaiting call back from yesterday. [phone]

## 2019-08-13 NOTE — Telephone Encounter (Signed)
Her mri has been scheduled on 9/14 and she has read her mychart message on 8/31 with her appt information.

## 2019-08-13 NOTE — Telephone Encounter (Signed)
Patient is wanting to know when she can have her MRI appointment.

## 2019-08-14 ENCOUNTER — Encounter: Payer: Self-pay | Admitting: Internal Medicine

## 2019-08-19 ENCOUNTER — Telehealth: Payer: Self-pay | Admitting: Family Medicine

## 2019-08-19 NOTE — Telephone Encounter (Signed)
Vaccine question

## 2019-08-19 NOTE — Telephone Encounter (Signed)
Answered pt questions about getting flu shot and Hepatitis vaccines a day apart.

## 2019-08-22 ENCOUNTER — Encounter: Payer: Self-pay | Admitting: Internal Medicine

## 2019-08-25 ENCOUNTER — Ambulatory Visit: Payer: Medicare Other

## 2019-08-28 DIAGNOSIS — Z23 Encounter for immunization: Secondary | ICD-10-CM | POA: Diagnosis not present

## 2019-09-01 ENCOUNTER — Other Ambulatory Visit: Payer: Self-pay | Admitting: Internal Medicine

## 2019-09-01 DIAGNOSIS — N644 Mastodynia: Secondary | ICD-10-CM

## 2019-09-03 ENCOUNTER — Other Ambulatory Visit: Payer: Self-pay

## 2019-09-03 ENCOUNTER — Ambulatory Visit (LOCAL_COMMUNITY_HEALTH_CENTER): Payer: Medicare Other

## 2019-09-03 DIAGNOSIS — Z23 Encounter for immunization: Secondary | ICD-10-CM

## 2019-09-03 NOTE — Progress Notes (Signed)
Twinrix #3 given; tolerated well Aileen Fass, RN

## 2019-09-18 ENCOUNTER — Other Ambulatory Visit: Payer: Self-pay

## 2019-09-18 ENCOUNTER — Ambulatory Visit
Admission: RE | Admit: 2019-09-18 | Discharge: 2019-09-18 | Disposition: A | Discharge status: Home / Self Care | Payer: Medicare Other | Source: Ambulatory Visit | Attending: Internal Medicine | Admitting: Internal Medicine

## 2019-09-18 DIAGNOSIS — Z20828 Contact with and (suspected) exposure to other viral communicable diseases: Secondary | ICD-10-CM | POA: Diagnosis not present

## 2019-09-18 DIAGNOSIS — N644 Mastodynia: Secondary | ICD-10-CM

## 2019-09-18 DIAGNOSIS — N6489 Other specified disorders of breast: Secondary | ICD-10-CM | POA: Diagnosis not present

## 2019-09-18 DIAGNOSIS — Z20822 Contact with and (suspected) exposure to covid-19: Secondary | ICD-10-CM

## 2019-09-18 DIAGNOSIS — R922 Inconclusive mammogram: Secondary | ICD-10-CM | POA: Diagnosis not present

## 2019-09-20 LAB — NOVEL CORONAVIRUS, NAA: SARS-CoV-2, NAA: NOT DETECTED

## 2019-09-29 ENCOUNTER — Telehealth: Payer: Self-pay | Admitting: Internal Medicine

## 2019-09-29 NOTE — Telephone Encounter (Signed)
Pt called to get COVID results.  Made her aware they are negative.

## 2019-10-05 ENCOUNTER — Encounter: Payer: Self-pay | Admitting: Internal Medicine

## 2019-10-08 DIAGNOSIS — H2513 Age-related nuclear cataract, bilateral: Secondary | ICD-10-CM | POA: Diagnosis not present

## 2019-10-10 ENCOUNTER — Other Ambulatory Visit: Payer: Self-pay | Admitting: *Deleted

## 2019-10-10 DIAGNOSIS — Z20828 Contact with and (suspected) exposure to other viral communicable diseases: Secondary | ICD-10-CM | POA: Diagnosis not present

## 2019-10-10 DIAGNOSIS — Z20822 Contact with and (suspected) exposure to covid-19: Secondary | ICD-10-CM

## 2019-10-11 LAB — NOVEL CORONAVIRUS, NAA: SARS-CoV-2, NAA: NOT DETECTED

## 2019-10-13 ENCOUNTER — Encounter: Payer: Self-pay | Admitting: Internal Medicine

## 2019-10-17 ENCOUNTER — Telehealth: Payer: Self-pay | Admitting: Internal Medicine

## 2019-10-17 ENCOUNTER — Other Ambulatory Visit: Payer: Self-pay | Admitting: Internal Medicine

## 2019-10-17 ENCOUNTER — Other Ambulatory Visit: Payer: Self-pay

## 2019-10-17 ENCOUNTER — Ambulatory Visit
Admission: RE | Admit: 2019-10-17 | Discharge: 2019-10-17 | Disposition: A | Discharge status: Home / Self Care | Payer: Medicare Other | Source: Ambulatory Visit | Attending: Internal Medicine | Admitting: Internal Medicine

## 2019-10-17 DIAGNOSIS — K862 Cyst of pancreas: Secondary | ICD-10-CM

## 2019-10-17 DIAGNOSIS — K8689 Other specified diseases of pancreas: Secondary | ICD-10-CM | POA: Diagnosis not present

## 2019-10-17 LAB — POCT I-STAT CREATININE: Creatinine, Ser: 1 mg/dL (ref 0.44–1.00)

## 2019-10-17 MED ORDER — GADOBUTROL 1 MMOL/ML IV SOLN
4.5000 mL | Freq: Once | INTRAVENOUS | Status: AC | PRN
  Administered 2019-10-17: 10:00:00 4.5 mL via INTRAVENOUS

## 2019-10-17 NOTE — Telephone Encounter (Signed)
Pt returning call for MRI results  

## 2019-10-17 NOTE — Telephone Encounter (Signed)
Results given and documented in result note. 

## 2019-10-21 ENCOUNTER — Encounter: Payer: Self-pay | Admitting: *Deleted

## 2019-11-07 IMAGING — MG MM DIGITAL DIAGNOSTIC BILAT W/ TOMO W/ CAD
8 series · 9 of 24 positions shown · non-contrast
Comparison: Previous exam(s).

CLINICAL DATA: A 1 month history of intermittent left axillary
pain.

EXAM:
DIGITAL DIAGNOSTIC BILATERAL MAMMOGRAM WITH CAD AND TOMO
ULTRASOUND LEFT BREAST

[R CC synth-2D]
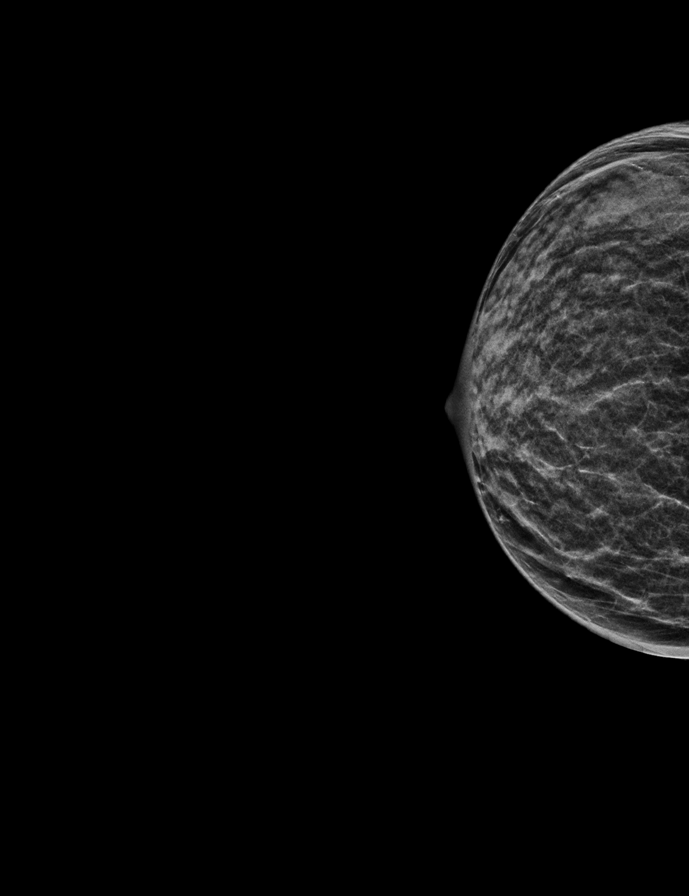

[L CC synth-2D]
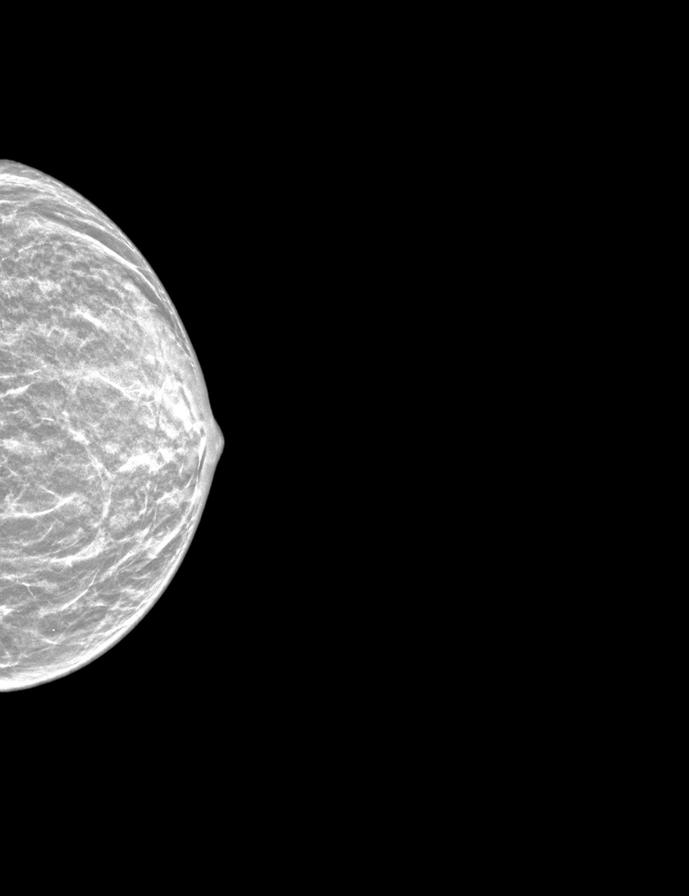

[L MLO synth-2D]
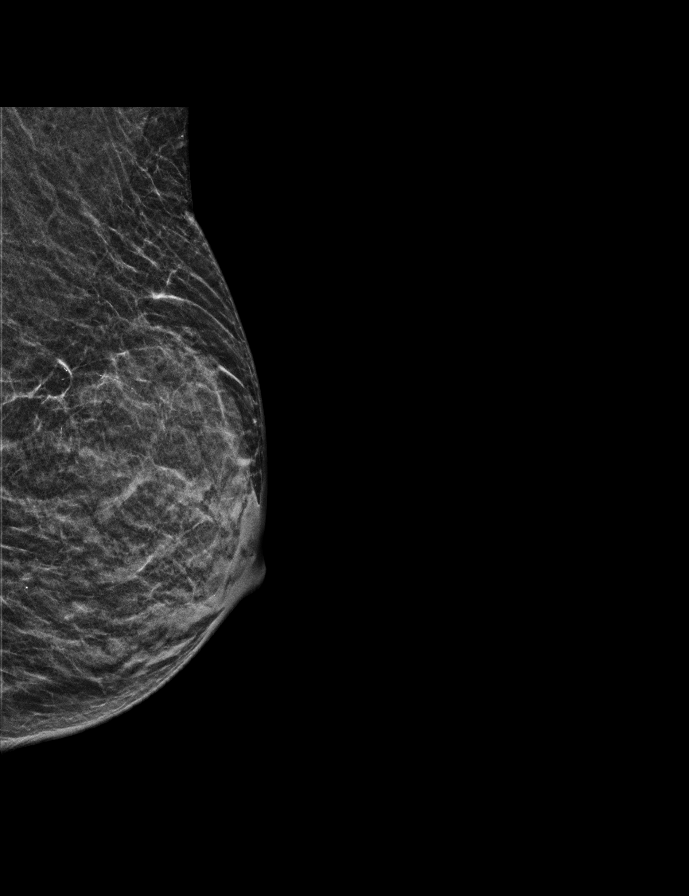

[R MLO synth-2D]
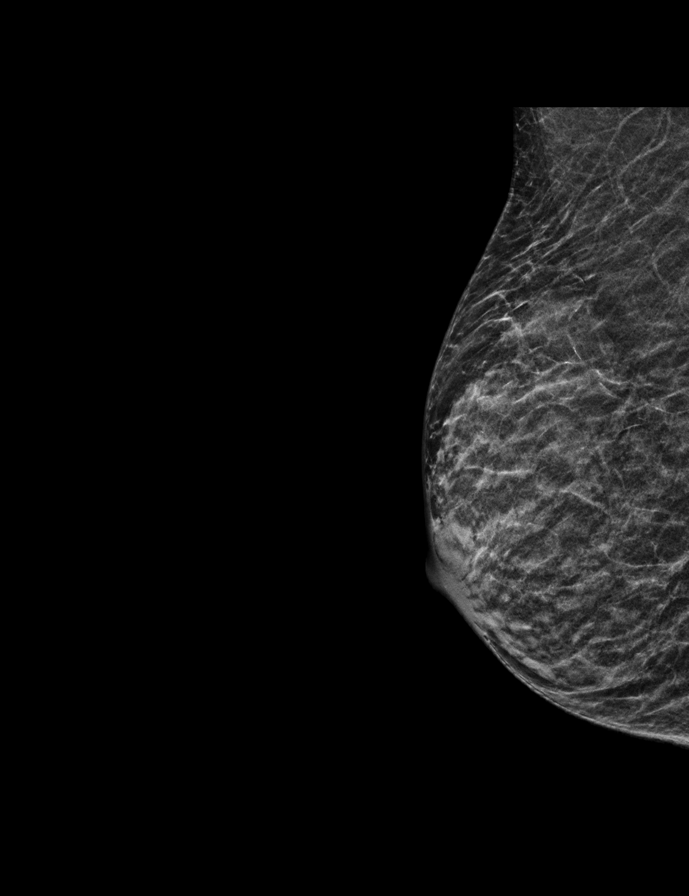

[L CC tomo · 2 of 28 frames shown]
[frame 10/28]
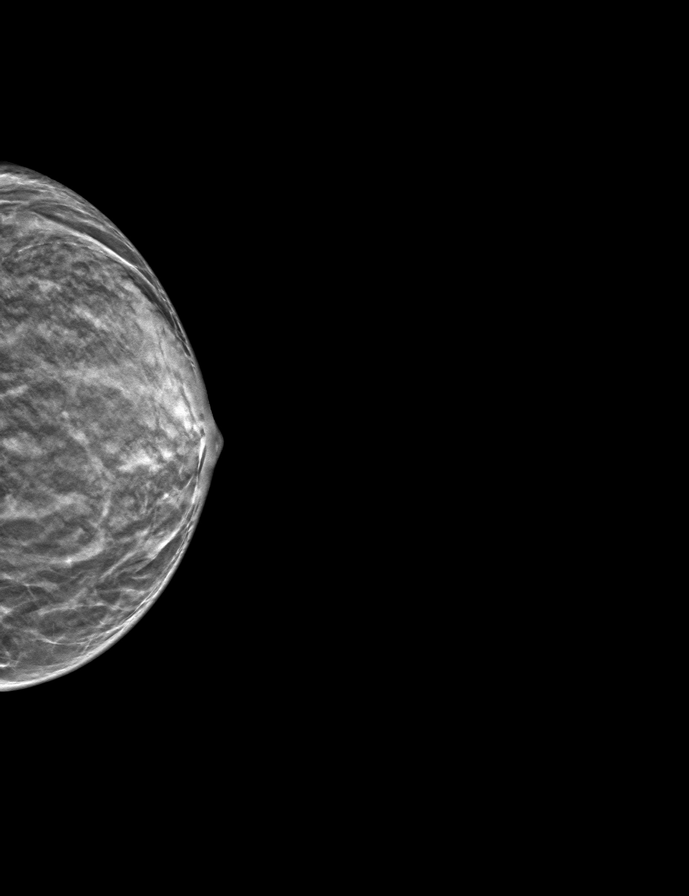
[frame 15/28]
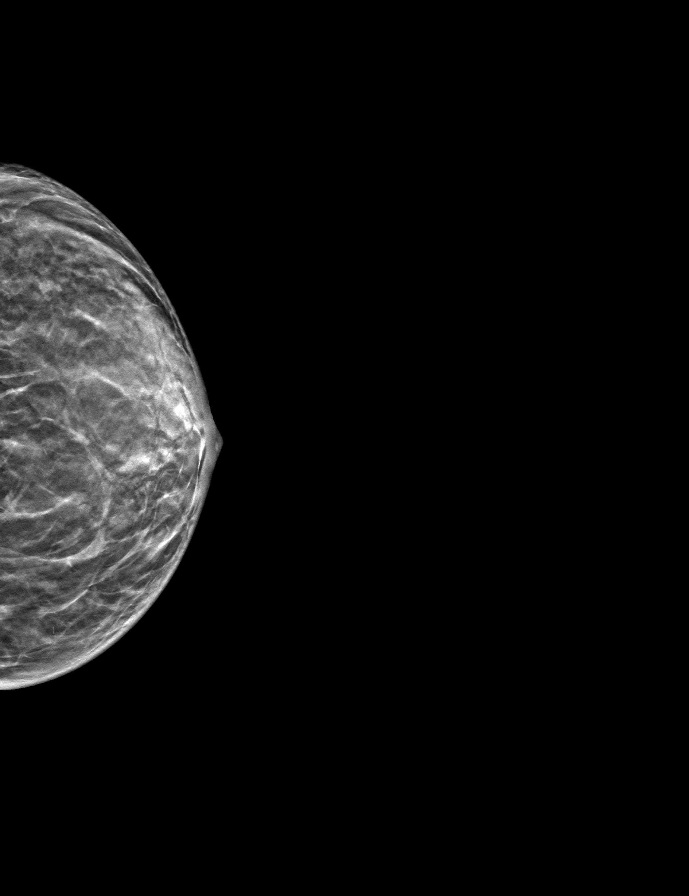

[R CC tomo · tomo slice 15/30.0]
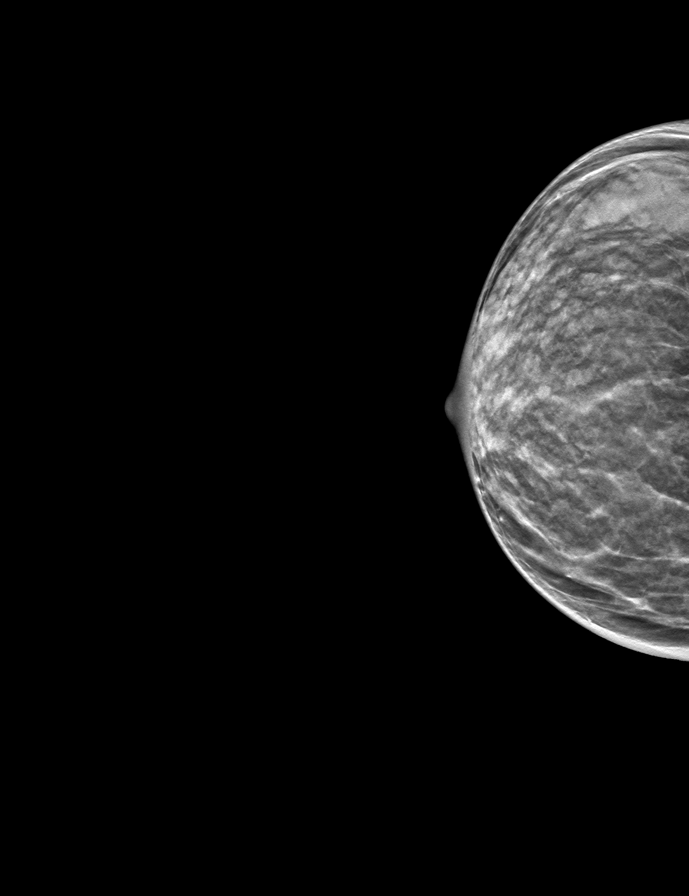

[R MLO tomo · tomo slice 15/29.0]
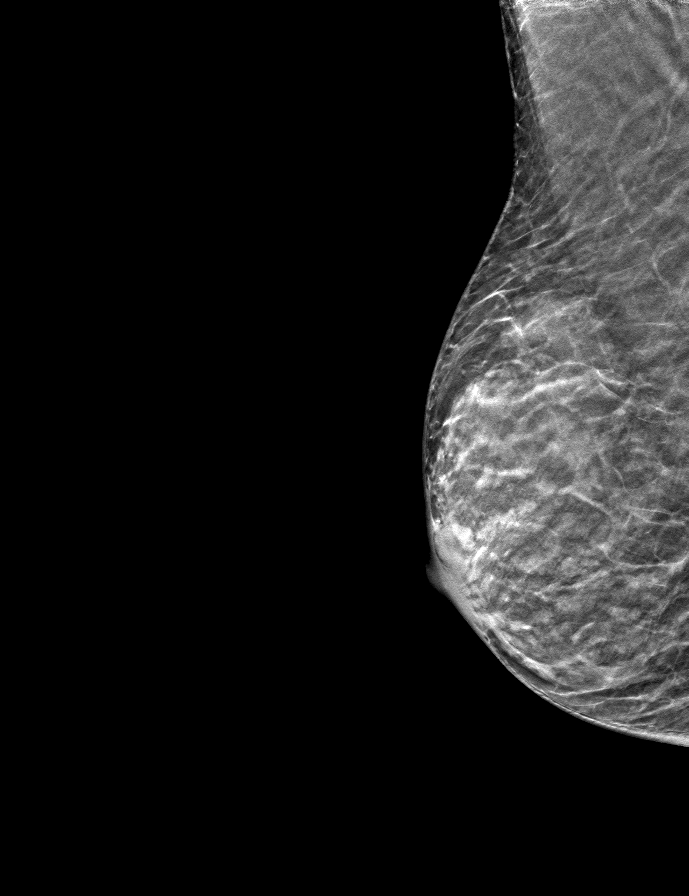

[L MLO tomo · tomo slice 15/28.0]
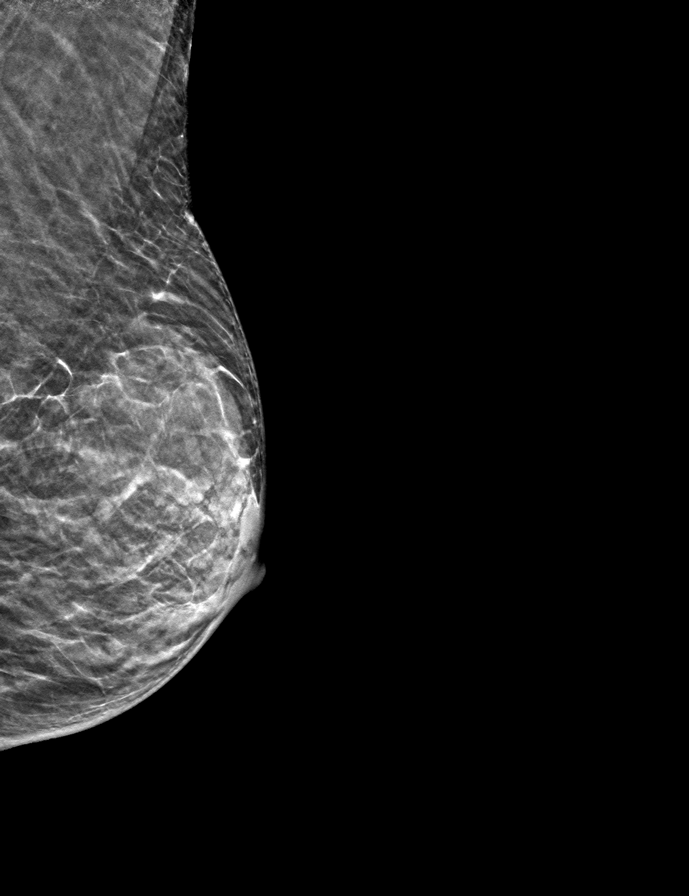

[9 of 24 positions shown; findings below may reference images not displayed]

ACR Breast Density Category c: The breast tissue is heterogeneously
dense, which may obscure small masses.
FINDINGS: No mammographic findings are identified to explain the patient's
symptoms. No suspicious mass, microcalcification, or other finding
is identified in either breast. Mammographic images were processed
with CAD.

Targeted left breast ultrasound was performed in the area of focal
pain in the left axilla. No suspicious solid or cystic mass is
identified. No abnormal appearing left axillary lymph nodes are
identified. No findings to explain the patient's symptoms.
IMPRESSION: No evidence of malignancy or findings to explain the patient's
symptoms.

RECOMMENDATION:
Any further workup of the patient's symptoms should be based on the
clinical assessment. Recommend routine annual screening mammogram in
1 year.

I have discussed the findings and recommendations with the patient.
If applicable, a reminder letter will be sent to the patient
regarding the next appointment.

BI-RADS CATEGORY  1: Negative.

## 2019-11-21 ENCOUNTER — Ambulatory Visit (INDEPENDENT_AMBULATORY_CARE_PROVIDER_SITE_OTHER): Payer: Medicare Other | Admitting: Internal Medicine

## 2019-11-21 ENCOUNTER — Other Ambulatory Visit: Payer: Self-pay

## 2019-11-21 ENCOUNTER — Encounter: Payer: Self-pay | Admitting: Internal Medicine

## 2019-11-21 VITALS — Ht 64.5 in | Wt 99.0 lb

## 2019-11-21 DIAGNOSIS — R1012 Left upper quadrant pain: Secondary | ICD-10-CM

## 2019-11-21 DIAGNOSIS — K5900 Constipation, unspecified: Secondary | ICD-10-CM

## 2019-11-21 DIAGNOSIS — K862 Cyst of pancreas: Secondary | ICD-10-CM

## 2019-11-21 DIAGNOSIS — R634 Abnormal weight loss: Secondary | ICD-10-CM

## 2019-11-21 MED ORDER — POLYETHYLENE GLYCOL 3350 17 G PO PACK: 17.0000 g | PACK | Freq: Every day | ORAL | 11 refills | Status: DC | PRN

## 2019-11-21 MED ORDER — DOCUSATE SODIUM 100 MG PO CAPS: 100.0000 mg | ORAL_CAPSULE | Freq: Every day | ORAL | 11 refills | Status: DC | PRN

## 2019-11-21 NOTE — Progress Notes (Addendum)
Telephone Note  I connected with Evelyn Barker  on 11/21/19 at  9:30 AM EST by telephone and verified that I am speaking with the correct person using two identifiers.  Location patient: home Location provider:work or home office Persons participating in the virtual visit: patient, provider  I discussed the limitations of evaluation and management by telemedicine and the availability of in person appointments. The patient expressed understanding and agreed to proceed.   HPI: 1. Constipation takes miralax but not every day and still with left below rib pain but after has good stool feels better so she wonders if constipation is cause  2. 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 Francella Solian and with wt loss consider endoscopic [place] bx she want to think about for now and will get back with me weighing 99 lbs though she is eating currently     ROS: See pertinent positives and negatives per HPI.  Past Medical History:  Diagnosis Date  . Anxiety    knees, low back   . Arthritis   . History of chicken pox   . History of macular degeneration   . History of shingles    x3   . Osteoporosis   . Osteoporosis    declines medication   . 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 ABDOMINAL HYSTERECTOMY     DUB, endometriosis no h/o abnormal pap   . APPENDECTOMY    . COLONOSCOPY N/A 04/30/2015   Procedure: COLONOSCOPY;  Surgeon: Josefine Class, MD;  Location: [facility] ENDOSCOPY;  Service: Endoscopy;  Laterality: N/A;  . TONSILLECTOMY      Family History  Problem Relation Age of Onset  . Stroke Mother   . Cancer Father        prostate   . Pneumonia Maternal Grandmother   . Hip fracture Maternal Grandmother   . Stroke Paternal Grandfather   . Skin cancer Other   . 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  aspirin 325 MG tablet, Take 325 mg by mouth every 6 (six) hours as needed for headache., Disp: , Rfl:  .  Glucosamine-Chondroitin (GLUCOSAMINE CHONDR COMPLEX PO), Take by mouth., Disp: , Rfl:  .  ibuprofen (ADVIL,MOTRIN) 200 MG tablet, Take 200 mg by mouth 2 (two) times daily as needed for moderate pain., Disp: , Rfl:  .  metroNIDAZOLE (METROGEL) 1 % gel, Apply topically daily. To face, Disp: 60 g, Rfl: 12 .  Multiple Vitamin (MULTIVITAMIN WITH MINERALS) TABS tablet, Take 1 tablet by mouth every other day., Disp: , Rfl:  .  multivitamin-lutein (OCUVITE-LUTEIN) CAPS capsule, Take 1 capsule by mouth every other day., Disp: , Rfl:  .  omeprazole (PRILOSEC) 40 MG capsule, Take 40 mg by mouth daily., Disp: , Rfl:  .  polyethylene glycol (MIRALAX / GLYCOLAX) 17 g packet, Take 17 g by mouth daily as needed., Disp: 30 each, Rfl: 11 .  triamcinolone cream (KENALOG) 0.1 %, Apply 1 application topically  2 (two) times daily. Right upper back as needed, Disp: 45 g, Rfl: 0 .  docusate sodium (COLACE) 100 MG capsule, Take 1 capsule (100 mg total) by mouth daily as needed for mild constipation., Disp: 60 capsule, Rfl: 11  EXAM:  VITALS per patient if applicable:  GENERAL: alert, oriented, appears well and in no acute distress  PSYCH/NEURO: pleasant and cooperative, no obvious depression or anxiety, speech and thought processing grossly intact  ASSESSMENT AND PLAN:  Discussed the following assessment and plan:  Constipation, unspecified constipation type - Plan: docusate sodium (COLACE) 100 MG capsule qd to bid prn, polyethylene glycol (MIRALAX / GLYCOLAX) 17 g packet rec take daily to see if this helps left upper ab pain    Pancreatic cyst -MRI/MRCP had 10/17/2019 rec f/u with GI [facility] or [facility] and consider EUS bx   HM Flu shot utd pna 23 had 09/11/15 prevnardisc prev. pt will think about it Given Rx shingrix prev  Tdap had 10/11/12  zostervax had  Hep C neg 01/07/19   Never smoker  No need for pap s/p hysterectomy DUB, endometriosis  Mammogram 09/18/19  A1C 5.6 dexa had 11/15/2017 +osteoporosis -2.9 left femur will repeatordered  -Pt never took fosamax disc proliaprev.  declines 2/2 c/w side effects  Colonoscopy h/o polyp tubular 05/03/15  Had repeat EGD/colonoscopy 06/17/2019 [facility] Dr. 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 Pt prefers to treat via telemedicine empirically rather then risking or undertaking an in person visit at this moment. Patient agrees to seek prompt in person care if worsening, new symptoms arise, or if is not improving with treatment.   I discussed the assessment and treatment plan with the patient. The patient was provided an opportunity to ask questions and all were answered. The patient agreed with the plan and demonstrated an understanding of the instructions.   The patient was advised to call back or seek an in-person evaluation if the symptoms worsen or if the condition fails to improve as anticipated.  Time spent 15 minutes Delorise Jackson, MD

## 2019-11-24 ENCOUNTER — Other Ambulatory Visit: Payer: Self-pay | Admitting: Internal Medicine

## 2019-11-24 DIAGNOSIS — R079 Chest pain, unspecified: Secondary | ICD-10-CM

## 2019-11-24 DIAGNOSIS — F419 Anxiety disorder, unspecified: Secondary | ICD-10-CM

## 2019-11-24 DIAGNOSIS — M810 Age-related osteoporosis without current pathological fracture: Secondary | ICD-10-CM

## 2019-11-24 MED ORDER — ALPRAZOLAM 0.25 MG PO TABS: 0.2500 mg | ORAL_TABLET | Freq: Every evening | ORAL | 2 refills | Status: DC | PRN

## 2019-12-16 ENCOUNTER — Other Ambulatory Visit: Payer: Self-pay

## 2019-12-16 ENCOUNTER — Ambulatory Visit (INDEPENDENT_AMBULATORY_CARE_PROVIDER_SITE_OTHER): Payer: Medicare Other | Admitting: Cardiovascular Disease

## 2019-12-16 ENCOUNTER — Encounter: Payer: Self-pay | Admitting: Cardiovascular Disease

## 2019-12-16 VITALS — BP 132/75 | HR 65 | Ht 65.0 in | Wt 96.0 lb

## 2019-12-16 DIAGNOSIS — R634 Abnormal weight loss: Secondary | ICD-10-CM | POA: Diagnosis not present

## 2019-12-16 DIAGNOSIS — R079 Chest pain, unspecified: Secondary | ICD-10-CM

## 2019-12-16 DIAGNOSIS — F419 Anxiety disorder, unspecified: Secondary | ICD-10-CM | POA: Diagnosis not present

## 2019-12-16 NOTE — Patient Instructions (Signed)

## 2019-12-16 NOTE — Progress Notes (Signed)
Cardiology Office Note  Date:  12/16/2019   ID:  Evelyn Barker, DOB [birth year], MRN [id number]  PCP:  McLean-Scocuzza, Nino Glow, MD   Chief Complaint  Patient presents with  . other    Chest pain believes it's anxiety. Meds reviewed verbally with pt.    HPI:  Evelyn Barker is a 74 year old woman with past medical history of Anxiety Unintentional weight loss Fatty liver GERD Constipation Pancreatic cyst Who presents by referral from Dr. Orland Dec for chest discomfort  On her visit today reports that she has had significant anxiety recently concerning several events Anxiety concerning getting her affairs under good condition such as her wheels and trusts, picking charities This has caused some chest tightness that seem to have resolved after these appointments were complete  Some tension in her chest this morning before the appointment, Denies having any discomfort with exertion or walking  She does not do regular exercise Gym at [facility] is closed She has been losing weight over the past year  CT abdomen pelvis May 2020, images pulled up and reviewed with her, no aortic atherosclerosis  PET scan August 2020 images pulled up and reviewed with her today minimal aortic atherosclerosis, no coronary calcification  Discussed her diet with her, Reviewed [employer] she try foods that were higher in carbohydrate She is having a lot of soup  EKG personally reviewed by myself on todays visit Shows normal sinus rhythm rate 65 bpm no significant ST-T wave changes   PMH:   has a past medical history of Anxiety, Arthritis, History of chicken pox, History of macular degeneration, History of shingles, Osteoporosis, Osteoporosis, and Seropositive for Lyme disease.  PSH:    Past Surgical History:  Procedure Laterality Date  . ABDOMINAL HYSTERECTOMY     DUB, endometriosis no h/o abnormal pap   . APPENDECTOMY    . COLONOSCOPY N/A 04/30/2015   Procedure:  COLONOSCOPY;  Surgeon: Josefine Class, MD;  Location: [facility] ENDOSCOPY;  Service: Endoscopy;  Laterality: N/A;  . TONSILLECTOMY      Current Outpatient Medications  Medication Sig Dispense Refill  . ALPRAZolam (XANAX) 0.25 MG tablet Take 1 tablet (0.25 mg total) by mouth at bedtime as needed for anxiety. 30 tablet 2  . aspirin 325 MG tablet Take 325 mg by mouth every 6 (six) hours as needed for headache.    . docusate sodium (COLACE) 100 MG capsule Take 1 capsule (100 mg total) by mouth daily as needed for mild constipation. 60 capsule 11  . Glucosamine-Chondroitin (GLUCOSAMINE CHONDR COMPLEX PO) Take by mouth.    Marland Kitchen ibuprofen (ADVIL,MOTRIN) 200 MG tablet Take 200 mg by mouth 2 (two) times daily as needed for moderate pain.    . metroNIDAZOLE (METROGEL) 1 % gel Apply topically daily. To face 60 g 12  . multivitamin-lutein (OCUVITE-LUTEIN) CAPS capsule Take 1 capsule by mouth every other day.    Marland Kitchen omeprazole (PRILOSEC) 40 MG capsule Take 40 mg by mouth daily.    . polyethylene glycol (MIRALAX / GLYCOLAX) 17 g packet Take 17 g by mouth daily as needed. 30 each 11  . triamcinolone cream (KENALOG) 0.1 % Apply 1 application topically 2 (two) times daily. Right upper back as needed 45 g 0   No current facility-administered medications for this visit.     Allergies:   Amoxil [amoxicillin], Tylenol [acetaminophen], Ampicillin, Keflex [cephalexin], Macrobid [nitrofurantoin], Ofloxacin, Penicillins, and Sulfur   Social History:  The patient  reports that she has never smoked. She  has never used smokeless tobacco. She reports previous alcohol use. She reports previous drug use.   Family History:   family history includes Cancer in her father; Hip fracture in her maternal grandmother; Pneumonia in her maternal grandmother; Skin cancer in an other family member; Stroke in her mother and paternal grandfather.    Review of Systems: Review of Systems  Constitutional: Negative.   HENT: Negative.    Respiratory: Negative.   Cardiovascular: Negative.        Vague chest discomfort  Gastrointestinal: Negative.   Musculoskeletal: Negative.   Neurological: Negative.   Psychiatric/Behavioral: The patient is nervous/anxious.   All other systems reviewed and are negative.   PHYSICAL EXAM: VS:  BP 132/75 (BP Location: Right Arm, Patient Position: Sitting, Cuff Size: Normal)   Pulse 65   Ht '5\' 5"'$  (1.651 m)   Wt 96 lb (43.5 kg)   SpO2 99%   BMI 15.98 kg/m  , BMI Body mass index is 15.98 kg/m. GEN: Well nourished,in no acute distress, very thin HEENT: normal Neck: no JVD, carotid bruits, or masses Cardiac: RRR; no murmurs, rubs, or gallops,no edema  Respiratory:  clear to auscultation bilaterally, normal work of breathing GI: soft, nontender, nondistended, + BS MS: no deformity or atrophy Skin: warm and dry, no rash Neuro:  Strength and sensation are intact Psych: euthymic mood, full affect   Recent Labs: 04/10/2019: ALT 13; BUN 15; Hemoglobin 15.0; Platelets 288.0; Potassium 4.1; Sodium 140; TSH 2.75 10/17/2019: Creatinine, Ser 1.00    Lipid Panel Lab Results  Component Value Date   CHOL 188 10/16/2018   HDL 65.70 10/16/2018   LDLCALC 109 (H) 10/16/2018   TRIG 69.0 10/16/2018      Wt Readings from Last 3 Encounters:  12/16/19 96 lb (43.5 kg)  11/21/19 99 lb (44.9 kg)  07/28/19 96 lb 14.4 oz (44 kg)       ASSESSMENT AND PLAN:  Problem List Items Addressed This Visit    None    Visit Diagnoses    Chest pain of uncertain etiology    -  Primary   Relevant Orders   EKG 12-Lead     Atypical chest pain Likely secondary to stress Seem to come on meeting with lawyers and before her visit today No discomfort with exertion CT scan images reviewed with no coronary calcification, minimal aortic atherosclerosis Normal EKG Very few risk factors, cholesterol well controlled, non-smoker, no diabetes Discussed anginal symptoms to watch for No further testing has been  ordered at this time She will call us for any change in her condition  Anorexia/unintentional weight loss Discussed diet with her She reports that she has met with a nutritionist Suggested snacking between meals, picking some items from her menu higher in carbohydrates  Aortic atherosclerosis Minimal disease noted on CT scan No further work-up needed  Anxiety Likely contributing to symptoms as detailed above Recommend a walking program, increasing her social activity Reports it has been a difficult year for her in quarantine  Disposition:   F/U  12 months   Total encounter time more than 60 minutes  Greater than 50% was spent in counseling and coordination of care with the patient    Signed, Esmond Plants, M.D., Ph.D. [place], [place]

## 2019-12-17 ENCOUNTER — Encounter: Payer: Self-pay | Admitting: Internal Medicine

## 2019-12-19 ENCOUNTER — Encounter: Payer: Self-pay | Admitting: Internal Medicine

## 2019-12-22 ENCOUNTER — Encounter: Payer: Self-pay | Admitting: Internal Medicine

## 2019-12-23 DIAGNOSIS — Z23 Encounter for immunization: Secondary | ICD-10-CM | POA: Diagnosis not present

## 2019-12-25 ENCOUNTER — Encounter: Payer: Self-pay | Admitting: Internal Medicine

## 2019-12-26 DIAGNOSIS — R899 Unspecified abnormal finding in specimens from other organs, systems and tissues: Secondary | ICD-10-CM | POA: Diagnosis not present

## 2019-12-26 DIAGNOSIS — R634 Abnormal weight loss: Secondary | ICD-10-CM | POA: Diagnosis not present

## 2019-12-26 DIAGNOSIS — K862 Cyst of pancreas: Secondary | ICD-10-CM | POA: Diagnosis not present

## 2019-12-26 DIAGNOSIS — R1012 Left upper quadrant pain: Secondary | ICD-10-CM | POA: Diagnosis not present

## 2019-12-29 DIAGNOSIS — M810 Age-related osteoporosis without current pathological fracture: Secondary | ICD-10-CM | POA: Diagnosis not present

## 2020-01-01 ENCOUNTER — Other Ambulatory Visit: Payer: Self-pay

## 2020-01-01 ENCOUNTER — Encounter: Payer: Self-pay | Admitting: Nurse Practitioner

## 2020-01-01 ENCOUNTER — Ambulatory Visit: Payer: Medicare Other | Admitting: Nurse Practitioner

## 2020-01-01 VITALS — BP 120/76 | HR 76 | Temp 97.3°F | Resp 16 | Ht 65.0 in | Wt 94.0 lb

## 2020-01-01 DIAGNOSIS — R21 Rash and other nonspecific skin eruption: Secondary | ICD-10-CM | POA: Diagnosis not present

## 2020-01-01 LAB — HM DEXA SCAN

## 2020-01-01 NOTE — Progress Notes (Signed)
Careteam: Patient Care Team: McLean-Scocuzza, Nino Glow, MD as PCP - General (Internal Medicine)  Advanced Directive information    Allergies  Allergen Reactions  . Amoxil [Amoxicillin]   . Tylenol [Acetaminophen]     ?reaction    . Ampicillin Rash  . Keflex [Cephalexin] Rash  . Macrobid [Nitrofurantoin] Rash  . Ofloxacin Rash  . Penicillins Rash  . Sulfur Rash    Chief Complaint  Patient presents with  . Acute Visit    Rash x On Right Deltoid.      HPI: Patient is a 74 y.o. female seen in today at the [facility] for itchy rash on right arm were she got her COVID vaccine. States this morning when she was half asleep half awake she felt something on the top on her arm and feels like something bit her and now there is a red itchy rash to the top on her arm.  Reports it is diminishing throughout the day.  No shortness of breath, chest pains, fever or chills.  Not itching at this time  Was hot this morning but now warm  Overall rash is now fading   Review of Systems:  Review of Systems  Constitutional: Negative for chills, fever and weight loss.  Skin: Positive for itching and rash.       Improving     Past Medical History:  Diagnosis Date  . Anxiety    knees, low back   . Arthritis   . History of chicken pox   . History of macular degeneration   . History of shingles    x3   . Osteoporosis   . Osteoporosis    declines medication   . 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 ABDOMINAL HYSTERECTOMY     DUB, endometriosis no h/o abnormal pap   . APPENDECTOMY    . COLONOSCOPY N/A 04/30/2015   Procedure: COLONOSCOPY;  Surgeon: Josefine Class, MD;  Location: [facility] ENDOSCOPY;  Service: Endoscopy;  Laterality: N/A;  . TONSILLECTOMY     Social History:   reports that she has never smoked. She has never used smokeless tobacco. She reports previous alcohol use. She reports previous  drug use.  Family History  Problem Relation Age of Onset  . Stroke Mother   . Cancer Father        prostate   . Pneumonia Maternal Grandmother   . Hip fracture Maternal Grandmother   . Stroke Paternal Grandfather   . Skin cancer Other   . Breast cancer Neg Hx     Medications: Patient's Medications  New Prescriptions   No medications on file  Previous Medications   ALPRAZOLAM (XANAX) 0.25 MG TABLET    Take 1 tablet (0.25 mg total) by mouth at bedtime as needed for anxiety.   ASPIRIN 325 MG TABLET    Take 325 mg by mouth every 6 (six) hours as needed for headache.   DOCUSATE SODIUM (COLACE) 100 MG CAPSULE    Take 1 capsule (100 mg total) by mouth daily as needed for mild constipation.   IBUPROFEN (ADVIL,MOTRIN) 200 MG TABLET    Take 200 mg by mouth 2 (two) times daily as needed for moderate pain.   MULTIVITAMIN-LUTEIN (OCUVITE-LUTEIN) CAPS CAPSULE    Take 1 capsule by mouth every other day.   POLYETHYLENE GLYCOL (MIRALAX / GLYCOLAX) 17 G PACKET    Take 17 g by mouth  daily as needed.   TRIAMCINOLONE CREAM (KENALOG) 0.1 %    Apply 1 application topically 2 (two) times daily. Right upper back as needed  Modified Medications   No medications on file  Discontinued Medications   GLUCOSAMINE-CHONDROITIN (GLUCOSAMINE CHONDR COMPLEX PO)    Take by mouth.   METRONIDAZOLE (METROGEL) 1 % GEL    Apply topically daily. To face   OMEPRAZOLE (PRILOSEC) 40 MG CAPSULE    Take 40 mg by mouth daily.    Physical Exam:  Vitals:   01/01/20 1428  BP: 120/76  Pulse: 76  Resp: 16  Temp: (!) 97.3 F (36.3 C)  SpO2: 96%  Weight: 94 lb (42.6 kg)  Height: 5\' 5"  (1.651 m)   Body mass index is 15.64 kg/m. Wt Readings from Last 3 Encounters:  01/01/20 94 lb (42.6 kg)  12/16/19 96 lb (43.5 kg)  11/21/19 99 lb (44.9 kg)    Physical Exam Skin:    General: Skin is warm.     Comments: Small Pink flat patchy rash noted on top of right arm. No sign of opening.      Labs reviewed: Basic Metabolic  Panel: Recent Labs    04/10/19 1024 10/17/19 0959  NA 140  --   K 4.1  --   CL 103  --   CO2 30  --   GLUCOSE 89  --   BUN 15  --   CREATININE 0.86 1.00  CALCIUM 9.7  --   TSH 2.75  --    Liver Function Tests: Recent Labs    04/10/19 1024  AST 17  ALT 13  ALKPHOS 67  BILITOT 1.6*  PROT 6.4  ALBUMIN 4.5   Recent Labs    04/10/19 1024  LIPASE 62.0*   No results for input(s): AMMONIA in the last 8760 hours. CBC: Recent Labs    04/10/19 1024  WBC 5.0  NEUTROABS 3.1  HGB 15.0  HCT 43.8  MCV 95.7  PLT 288.0   Lipid Panel: No results for input(s): CHOL, HDL, LDLCALC, TRIG, CHOLHDL, LDLDIRECT in the last 8760 hours. TSH: Recent Labs    04/10/19 1024  TSH 2.75   A1C: Lab Results  Component Value Date   HGBA1C 5.6 11/19/2018     Assessment/Plan 1. Rash Do not feel like this is related to vaccine as she ot her vaccine 9 days ago and first notice this morning. Overall area has already started to improve. No open area. Area mildly pink and without erythema. Slight itching. She can apply benadryl cream PRN which she reports she has at home. If area worsens, becomes painful, red or hot to notify PCP or clinic.    Carlos American. [place], [facility] [phone]

## 2020-01-02 ENCOUNTER — Encounter: Payer: Self-pay | Admitting: Internal Medicine

## 2020-01-07 ENCOUNTER — Ambulatory Visit: Payer: Medicare Other | Attending: Internal Medicine

## 2020-01-07 ENCOUNTER — Telehealth: Payer: Self-pay | Admitting: *Deleted

## 2020-01-07 DIAGNOSIS — Z20822 Contact with and (suspected) exposure to covid-19: Secondary | ICD-10-CM | POA: Diagnosis not present

## 2020-01-07 NOTE — Telephone Encounter (Signed)
Patient called and LM on Clinical Intake stating that she was having SOB x several days.   I called patient back and she stated that she had just spoke with the [facility] Nurse and she told her to call her PCP, Karlyn Agee.   Patient stated that she is going to call her PCP now and try to be seen today. Agreed.

## 2020-01-08 ENCOUNTER — Emergency Department: Payer: Medicare Other

## 2020-01-08 ENCOUNTER — Other Ambulatory Visit: Payer: Self-pay

## 2020-01-08 ENCOUNTER — Emergency Department
Admission: EM | Admit: 2020-01-08 | Discharge: 2020-01-08 | Disposition: A | Discharge status: Home / Self Care | Payer: Medicare Other | Attending: Student | Admitting: Student

## 2020-01-08 ENCOUNTER — Encounter: Payer: Self-pay | Admitting: Emergency Medicine

## 2020-01-08 DIAGNOSIS — R0789 Other chest pain: Secondary | ICD-10-CM | POA: Diagnosis not present

## 2020-01-08 DIAGNOSIS — F419 Anxiety disorder, unspecified: Secondary | ICD-10-CM | POA: Insufficient documentation

## 2020-01-08 DIAGNOSIS — R079 Chest pain, unspecified: Secondary | ICD-10-CM

## 2020-01-08 LAB — BASIC METABOLIC PANEL
Anion gap: 6 (ref 5–15)
BUN: 17 mg/dL (ref 8–23)
CO2: 30 mmol/L (ref 22–32)
Calcium: 9.4 mg/dL (ref 8.9–10.3)
Chloride: 103 mmol/L (ref 98–111)
Creatinine, Ser: 0.72 mg/dL (ref 0.44–1.00)
GFR calc Af Amer: >60 mL/min (ref >60)
GFR calc non Af Amer: >60 mL/min (ref >60)
Glucose, Bld: 90 mg/dL (ref 70–99)
Potassium: 4.3 mmol/L (ref 3.5–5.1)
Sodium: 139 mmol/L (ref 135–145)

## 2020-01-08 LAB — CBC
HCT: 44.8 % (ref 36.0–46.0)
Hemoglobin: 15.1 g/dL — ABNORMAL HIGH (ref 12.0–15.0)
MCH: 32.5 pg (ref 26.0–34.0)
MCHC: 33.7 g/dL (ref 30.0–36.0)
MCV: 96.3 fL (ref 80.0–100.0)
Platelets: 230 10*3/uL (ref 150–400)
RBC: 4.65 MIL/uL (ref 3.87–5.11)
RDW: 12.3 % (ref 11.5–15.5)
WBC: 5.2 10*3/uL (ref 4.0–10.5)
nRBC: 0 % (ref 0.0–0.2)

## 2020-01-08 LAB — TROPONIN I (HIGH SENSITIVITY): Troponin I (High Sensitivity): <2 ng/L (ref <18)

## 2020-01-08 NOTE — Telephone Encounter (Signed)
Pt called into office and said she is having chest pains and is fatigued. She also had the Covid test done yesterday. I saw she already called yesterday and spoke with CMA so I called [facility] Beau to see if Dr. Olivia Mackie could possibly get her in. He said there is no availability until next Tuesday and she needed to be seen by ED. I told patient she needs to go to ED and she said she really didn't want to wait that long to be seen. I suggested she could go to Urgent Care and also called [facility] and they told me they would happily see her but patient really needs to go to ED to get a full work up and labs to see what is going on. I informed patient of what they said and she agreed to go to ED even though she really preferred not to sit and wait all that time. I told her the importance of her going and she said ok. I also let her know that I would let Dr. Olivia Mackie know she is going to go.

## 2020-01-08 NOTE — Telephone Encounter (Signed)
Rec urgent care or ED, ED would be best   Evelyn Barker

## 2020-01-08 NOTE — ED Provider Notes (Signed)
[facility] Emergency Department Provider Note  ____________________________________________   First MD Initiated Contact with Patient 01/08/20 1504     (approximate)  I have reviewed the triage vital signs and the nursing notes.  History  Chief Complaint Chest Pain    HPI Evelyn Barker is a 74 y.o. female with history of anxiety, arthritis, osteoporosis who presents to the emergency department for chest pain.  Patient states her chest pain has been ongoing for almost 8 or 9 months now.  Initially her discomfort was intermittent, and over the last several weeks to months it has become more constant.  She describes it as an aggravating type pain, located across her entire chest, radiates somewhat up into her neck.  Waxes and wanes in severity, currently mild.  Improved with deep breathing and relaxation.  Aggravated by anxiety, which she feels might be the etiology of her symptoms. Reports anxiety related to getting her documents in order and the current pandemic. States she has a history of anxiety since her teen years, was recently was prescribed some Xanax by her PCP, but she does not like to take this frequently because it makes her drowsy. Also recently started on Prilosec, which she has taken previously, has not seen any significant change in her symptoms with this.  She has discussed her symptoms with some of the nurses at [facility], who recommended she speak to one of the mental health providers there, which she is interested in.  She denies any SI or HI or depression.  Seen by cardiology on 1/5 who suspected her symptoms are likely secondary to stress and anxiety.  She denies any fevers, cough, shortness of breath, leg swelling.  No history of HTN, HLD, DM, or tobacco use.  Tested for COVID yesterday, awaiting results.  She is unsure of a specific trigger that set off the symptoms last spring, the only thing she can identify was that they occurred after a trip to  [place], and wonders if she potentially picked up COVID at that time, but subsequently tested and negative.     Past Medical Hx Past Medical History:  Diagnosis Date  . Anxiety    knees, low back   . Arthritis   . History of chicken pox   . History of macular degeneration   . History of shingles    x3   . Osteoporosis   . Osteoporosis    declines medication   . 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 Constipation 11/21/2019  . Gastroesophageal reflux disease 07/28/2019  . Pancreatic cyst 05/23/2019  . Fatty liver 12/27/2018  . Rosacea 11/19/2018  . Osteoporosis 10/16/2018  . Knee pain 10/16/2018  . Right hip pain 10/16/2018  . Weight loss 10/16/2018    Past Surgical Hx Past Surgical History:  Procedure Laterality Date  . ABDOMINAL HYSTERECTOMY     DUB, endometriosis no h/o abnormal pap   . APPENDECTOMY    . COLONOSCOPY N/A 04/30/2015   Procedure: COLONOSCOPY;  Surgeon: Josefine Class, MD;  Location: [facility] ENDOSCOPY;  Service: Endoscopy;  Laterality: N/A;  . TONSILLECTOMY      Medications Prior to Admission medications   Medication Sig Start Date End Date Taking? Authorizing Provider  ALPRAZolam (XANAX) 0.25 MG tablet Take 1 tablet (0.25 mg total) by mouth at bedtime as needed for anxiety. 11/24/19   McLean-Scocuzza, Nino Glow, MD  aspirin 325 MG tablet Take 325 mg by mouth every 6 (six) hours as needed for headache.    [provider]  docusate sodium (COLACE) 100 MG capsule Take 1 capsule (100 mg total) by mouth daily as needed for mild constipation. 11/21/19   McLean-Scocuzza, Nino Glow, MD  ibuprofen (ADVIL,MOTRIN) 200 MG tablet Take 200 mg by mouth 2 (two) times daily as needed for moderate pain.    [provider]  multivitamin-lutein (OCUVITE-LUTEIN) CAPS capsule Take 1 capsule by mouth every other day.    [provider]  polyethylene glycol  (MIRALAX / GLYCOLAX) 17 g packet Take 17 g by mouth daily as needed. 11/21/19   McLean-Scocuzza, Nino Glow, MD  triamcinolone cream (KENALOG) 0.1 % Apply 1 application topically 2 (two) times daily. Right upper back as needed 10/16/18   McLean-Scocuzza, Nino Glow, MD    Allergies Amoxil [amoxicillin], Tylenol [acetaminophen], Ampicillin, Keflex [cephalexin], Macrobid [nitrofurantoin], Ofloxacin, Penicillins, and Sulfur  Family Hx Family History  Problem Relation Age of Onset  . Stroke Mother   . Cancer Father        prostate   . Pneumonia Maternal Grandmother   . Hip fracture Maternal Grandmother   . Stroke Paternal Grandfather   . Skin cancer Other   . Breast cancer Neg Hx     Social Hx Social History   Tobacco Use  . Smoking status: Never Smoker  . Smokeless tobacco: Never Used  Substance Use Topics  . Alcohol use: Not Currently  . Drug use: Not Currently     Review of Systems  Constitutional: Negative for fever, chills. Eyes: Negative for visual changes. ENT: Negative for sore throat. Cardiovascular: + for chest pain. Respiratory: Negative for shortness of breath. Gastrointestinal: Negative for nausea, vomiting.  Genitourinary: Negative for dysuria. Musculoskeletal: Negative for leg swelling. Skin: Negative for rash. Neurological: Negative for headaches.   Physical Exam  Vital Signs: ED Triage Vitals  Enc Vitals Group     BP 01/08/20 1220 (!) 175/87     Pulse Rate 01/08/20 1220 71     Resp 01/08/20 1220 19     Temp 01/08/20 1220 98.2 F (36.8 C)     Temp Source 01/08/20 1220 Oral     SpO2 01/08/20 1220 98 %     Weight 01/08/20 1220 98 lb (44.5 kg)     Height 01/08/20 1220 5\' 4"  (1.626 m)     Head Circumference --      Peak Flow --      Pain Score 01/08/20 1233 6     Pain Loc --      Pain Edu? --      Excl. in [place]? --     Constitutional: Alert and oriented.  Head: Normocephalic. Atraumatic. Eyes: Conjunctivae clear. Sclera anicteric. Nose: No  congestion. No rhinorrhea. Mouth/Throat: Wearing mask.  Neck: No stridor.   Cardiovascular: Normal rate, regular rhythm. Extremities well perfused. Respiratory: Normal respiratory effort.  Lungs CTAB. Gastrointestinal: Soft. Non-tender. Non-distended.  Musculoskeletal: No lower extremity edema. No deformities. Neurologic:  Normal speech and language. No gross focal neurologic deficits are appreciated.  Skin: Skin is warm, dry and intact. No rash noted. Psychiatric: Mood and affect are appropriate for situation.  EKG  Personally reviewed.   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    Procedures  Procedure(s) performed (including critical care):  Procedures   Initial  Impression / Assessment and Plan / ED Course  74 y.o. female who presents to the ED for chronic chest pain, as above.  Ddx: atypical ACS, pulmonary infection, anxiety/stress, GERD.  Do not suspect PE given chronicity of symptoms, lack of risk factors.  Do not suspect aortic pathology, no associated neurological symptoms, chronicity unlikely, equal and symmetric distal pulses.  Will obtain labs, imaging, EKG.  Labs without actionable derangements, negative troponin.  EKG without acute ischemic changes.  No acute abnormalities on XR.  Suspect a strong stress/anxiety component to her symptoms, and recommended/encouraged following up with the [facility] mental health provider, which she will do.  Again, she specifically denies any SI, HI, or depression.  Also advised continuing the Prilosec that she recently started.  Otherwise, patient is stable for discharge.  She is comfortable with this plan.  Advised PCP follow-up and given return precautions.   Final Clinical Impression(s) / ED Diagnosis  Final diagnoses:  Chest pain in adult       Note:  This document was prepared using Dragon voice  recognition software and may include unintentional dictation errors.   Lilia Pro., MD 01/08/20 1630

## 2020-01-08 NOTE — Discharge Instructions (Signed)
Thank you for letting us take care of you in the emergency department today.   Please continue to take any regular, prescribed medications. Keep taking the Prilosec for the next several weeks to see if it helps with your symptoms.   We recommend getting in touch with the provider at [facility] to talk about your anxiety and different ways to help manage and control it! This can be very helpful with both anxiety and your chest symptoms.   Please follow up with: - Your primary care doctor to review your ER visit and follow up on your symptoms.   Please return to the ER for any new or worsening symptoms.

## 2020-01-08 NOTE — ED Triage Notes (Signed)
Patient to ER for c/o chest pain "all the way across" with radiating pain into neck. Patient reports chest pain has been intermittent since the summer, but radiating to neck started approx one week ago. Patient reports deep breathing helps the pain some.

## 2020-01-08 NOTE — Telephone Encounter (Signed)
Per chart patient was evaluated at ER.

## 2020-01-09 LAB — NOVEL CORONAVIRUS, NAA: SARS-CoV-2, NAA: NOT DETECTED

## 2020-01-15 ENCOUNTER — Encounter: Payer: Self-pay | Admitting: Internal Medicine

## 2020-01-15 ENCOUNTER — Telehealth: Payer: Self-pay | Admitting: Internal Medicine

## 2020-01-15 ENCOUNTER — Ambulatory Visit: Payer: Self-pay | Admitting: Nurse Practitioner

## 2020-01-15 NOTE — Telephone Encounter (Signed)
Dr Jacklynn Lewis I continue to loose weight. Yesterday I weighed 95 lbs on a relative new digital scale. I try to eat as much as I can of nutritious meals.  This pain in my left side continues off and on, occasionally moving to under my left breast off and on, up to a scale of 8-9, then going away later in the late afternoon. This progression worries me.  I have an appointment with you on March 17, 4pm by phone. The [facility] nurse asked me to ask if I could make a sooner appt. Please let me know. [name]   Call pt try to work in sooner  1. Does she want to try remeron which is antidepressant but we use for weight gain and you take at night?  2. rec she f/u with [facility] does she want me to make her an appt  Ive worked her up for everything she does have the pancreatitic cyst but this needs to be worked up further with GI as if could be causing her symptoms and pain   -does she want referral  3. Try to move appt up if able   [place]

## 2020-01-20 DIAGNOSIS — Z23 Encounter for immunization: Secondary | ICD-10-CM | POA: Diagnosis not present

## 2020-01-21 ENCOUNTER — Other Ambulatory Visit: Payer: Self-pay | Admitting: Internal Medicine

## 2020-01-21 NOTE — Telephone Encounter (Signed)
Patient is ok with GI referral. She doesn't want medication.  She wants an ASAP appointment with GI.

## 2020-01-21 NOTE — Telephone Encounter (Signed)
Referral sent [facility] Dr.Jowell urgently consider endoscopic [place] and bx of pancreatic cyst/mass  tMS

## 2020-01-21 NOTE — Addendum Note (Signed)
Addended by: Orland Mustard on: 01/21/2020 11:36 PM   Modules accepted: Orders

## 2020-02-11 ENCOUNTER — Ambulatory Visit: Payer: Medicare Other | Admitting: Internal Medicine

## 2020-02-25 ENCOUNTER — Ambulatory Visit: Payer: Medicare Other | Admitting: Internal Medicine

## 2020-03-01 ENCOUNTER — Other Ambulatory Visit: Payer: Self-pay

## 2020-03-03 ENCOUNTER — Ambulatory Visit (INDEPENDENT_AMBULATORY_CARE_PROVIDER_SITE_OTHER): Payer: Medicare Other | Admitting: Internal Medicine

## 2020-03-03 ENCOUNTER — Other Ambulatory Visit: Payer: Self-pay

## 2020-03-03 ENCOUNTER — Encounter: Payer: Self-pay | Admitting: Internal Medicine

## 2020-03-03 VITALS — BP 116/74 | HR 80 | Temp 97.8°F | Ht 64.0 in | Wt 92.6 lb

## 2020-03-03 DIAGNOSIS — F419 Anxiety disorder, unspecified: Secondary | ICD-10-CM | POA: Diagnosis not present

## 2020-03-03 DIAGNOSIS — K862 Cyst of pancreas: Secondary | ICD-10-CM | POA: Diagnosis not present

## 2020-03-03 DIAGNOSIS — R634 Abnormal weight loss: Secondary | ICD-10-CM | POA: Diagnosis not present

## 2020-03-03 DIAGNOSIS — L299 Pruritus, unspecified: Secondary | ICD-10-CM | POA: Diagnosis not present

## 2020-03-03 DIAGNOSIS — K219 Gastro-esophageal reflux disease without esophagitis: Secondary | ICD-10-CM | POA: Diagnosis not present

## 2020-03-03 MED ORDER — TRIAMCINOLONE ACETONIDE 0.1 % EX CREA: 1.0000 "application " | TOPICAL_CREAM | Freq: Two times a day (BID) | CUTANEOUS | 5 refills | Status: DC

## 2020-03-03 MED ORDER — FAMOTIDINE 20 MG PO TABS: 20.0000 mg | ORAL_TABLET | Freq: Every day | ORAL | 3 refills | Status: DC

## 2020-03-03 NOTE — Progress Notes (Signed)
Chief Complaint  Patient presents with  . Gastroesophageal Reflux  . Weight Loss   F/u  1. 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 GERD worse on prilosec 40 mg qam and still having GERD EGD 2020 with chronic gastritis  3. Anxiety due to #1 GAD 7 score 8 today has prn xanax at times notes CP and SOB with increased anxiety declines remeron for weight loss today   Review of Systems  Constitutional: Positive for weight loss.  HENT: Negative for hearing loss.   Eyes: Negative for blurred vision.  Respiratory: Negative for shortness of breath.   Cardiovascular: Negative for chest pain.  Gastrointestinal: Positive for heartburn.   Past Medical History:  Diagnosis Date  . Anxiety    knees, low back   . Arthritis   . History of chicken pox   . History of macular degeneration   . History of shingles    x3   . Osteoporosis   . Osteoporosis    declines medication   . 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 ABDOMINAL HYSTERECTOMY     DUB, endometriosis no h/o abnormal pap   . APPENDECTOMY    . COLONOSCOPY N/A 04/30/2015   Procedure: COLONOSCOPY;  Surgeon: Josefine Class, MD;  Location: [facility] ENDOSCOPY;  Service: Endoscopy;  Laterality: N/A;  . TONSILLECTOMY     Family History  Problem Relation Age of Onset  . Stroke Mother   . Cancer Father        prostate   . Pneumonia Maternal Grandmother   . Hip fracture Maternal Grandmother   . Stroke Paternal Grandfather   . Skin cancer Other   . Breast cancer Neg Hx    Social History   Socioeconomic History  . Marital status: Widowed    Spouse name: Not on file  . Number of children: Not on file  . Years of education: Not on file  . Highest education level: Not on file  Occupational History  . Not on file  Tobacco Use  . Smoking status:  Never Smoker  . Smokeless tobacco: Never Used  Substance and Sexual Activity  . Alcohol use: Not Currently  . Drug use: Not Currently  . Sexual activity: Not Currently    Partners: Male  Other Topics Concern  . 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 Difficulty of Paying Living Expenses:   Food Insecurity:   . Worried About Charity fundraiser in the Last Year:   . Arboriculturist in the Last Year:   Transportation Needs:   . Film/video editor (Medical):   Marland Kitchen Lack of Transportation (Non-Medical):   Physical Activity:   . Days of Exercise per Week:   . Minutes of Exercise per Session:   Stress:   .  Feeling of Stress :   Social Connections:   . Frequency of Communication with Friends and Family:   . Frequency of Social Gatherings with Friends and Family:   . Attends Religious Services:   . Active Member of Clubs or Organizations:   . Attends Archivist Meetings:   Marland Kitchen Marital Status:   Intimate Partner Violence:   . Fear of Current or Ex-Partner:   . Emotionally Abused:   Marland Kitchen Physically Abused:   . Sexually Abused:    Current Meds  Medication Sig  . ALPRAZolam (XANAX) 0.25 MG tablet Take 1 tablet (0.25 mg total) by mouth at bedtime as needed for anxiety.  . docusate sodium (COLACE) 100 MG capsule Take 1 capsule (100 mg total) by mouth daily as needed for mild constipation.  . multivitamin-lutein (OCUVITE-LUTEIN) CAPS capsule Take 1 capsule by mouth every other day.  Marland Kitchen omeprazole (PRILOSEC) 40 MG capsule Take 40 mg by mouth daily.  . polyethylene glycol (MIRALAX / GLYCOLAX) 17 g packet Take 17 g by mouth daily as  needed.  . triamcinolone cream (KENALOG) 0.1 % Apply 1 application topically 2 (two) times daily. Right upper back as needed  . [DISCONTINUED] triamcinolone cream (KENALOG) 0.1 % Apply 1 application topically 2 (two) times daily. Right upper back as needed   Allergies  Allergen Reactions  . Amoxil [Amoxicillin]   . Tylenol [Acetaminophen]     ?reaction    . Ampicillin Rash  . Keflex [Cephalexin] Rash  . Macrobid [Nitrofurantoin] Rash  . Ofloxacin Rash  . Penicillins Rash  . Sulfur Rash   Recent Results (from the past 2160 hour(s))  HM DEXA SCAN     Status: None   Collection Time: 01/01/20 12:00 AM  Result Value Ref Range   HM Dexa Scan osteoporosis     Comment: T -3.0 L fem neck and -2.6 hip, L spine -1.9   HM DEXA SCAN     Status: None   Collection Time: 01/01/20 12:00 AM  Result Value Ref Range   HM Dexa Scan osteoporosis      Comment: -1.9 lumbar, L fem hip -3.0, left total hip -2.6   Novel Coronavirus, NAA ([facility])     Status: None   Collection Time: 01/07/20 12:33 PM   Specimen: Nasopharyngeal(NP) swabs in vial transport medium   NASOPHARYNGE  TESTING  Result Value Ref Range   SARS-CoV-2, NAA Not Detected Not Detected    Comment: This nucleic acid amplification test was developed and its performance characteristics determined by [employer]. Nucleic acid amplification tests include RT-PCR and TMA. This test has not been FDA cleared or approved. This test has been authorized by FDA under an Emergency Use Authorization (EUA). This test is only authorized for the duration of time the declaration that circumstances exist justifying the authorization of the emergency use of in vitro diagnostic tests for detection of SARS-CoV-2 virus and/or diagnosis of COVID-19 infection under section 564(b)(1) of the Act, 21 U.S.C. [id number]) (1), unless the authorization is terminated or revoked sooner. When diagnostic testing is negative, the possibility of a false negative  result should be considered in the context of a patient's recent exposures and the presence of clinical signs and symptoms consistent with COVID-19. An individual without symptoms of COVID-19 and who is not shedding SARS-CoV-2 virus wo uld expect to have a negative (not detected) result in this assay.   Basic metabolic panel     Status: None   Collection Time: 01/08/20 12:27 PM  Result  Value Ref Range   Sodium 139 135 - 145 mmol/L   Potassium 4.3 3.5 - 5.1 mmol/L   Chloride 103 98 - 111 mmol/L   CO2 30 22 - 32 mmol/L   Glucose, Bld 90 70 - 99 mg/dL   BUN 17 8 - 23 mg/dL   Creatinine, Ser 0.72 0.44 - 1.00 mg/dL   Calcium 9.4 8.9 - 10.3 mg/dL   GFR calc non Af Amer >60 >60 mL/min   GFR calc Af Amer >60 >60 mL/min   Anion gap 6 5 - 15    Comment: Performed at [facility], [address] [place], [place] [address]  CBC     Status: Abnormal   Collection Time: 01/08/20 12:27 PM  Result Value Ref Range   WBC 5.2 4.0 - 10.5 K/uL   RBC 4.65 3.87 - 5.11 MIL/uL   Hemoglobin 15.1 (H) 12.0 - 15.0 g/dL   HCT 44.8 36.0 - 46.0 %   MCV 96.3 80.0 - 100.0 fL   MCH 32.5 26.0 - 34.0 pg   MCHC 33.7 30.0 - 36.0 g/dL   RDW 12.3 11.5 - 15.5 %   Platelets 230 150 - 400 K/uL   nRBC 0.0 0.0 - 0.2 %    Comment: Performed at [facility], [place], [place] [address]  Troponin I (High Sensitivity)     Status: None   Collection Time: 01/08/20 12:27 PM  Result Value Ref Range   Troponin I (High Sensitivity) <2 <18 ng/L    Comment: (NOTE) Elevated high sensitivity troponin I (hsTnI) values and significant  changes across serial measurements may suggest ACS but many other  chronic and acute conditions are known to elevate hsTnI results.  Refer to the "Links" section for chest pain algorithms and additional  guidance. Performed at [facility], [address] [place], [place] [address]    Objective  Body mass index is 15.89 kg/m. Wt Readings from Last 3  Encounters:  03/03/20 92 lb 9.6 oz (42 kg)  01/08/20 98 lb (44.5 kg)  01/01/20 94 lb (42.6 kg)   Temp Readings from Last 3 Encounters:  03/03/20 97.8 F (36.6 C) (Temporal)  01/08/20 98.2 F (36.8 C) (Oral)  01/01/20 (!) 97.3 F (36.3 C)   BP Readings from Last 3 Encounters:  03/03/20 116/74  01/08/20 (!) 175/87  01/01/20 120/76   Pulse Readings from Last 3 Encounters:  03/03/20 80  01/08/20 71  01/01/20 76    Physical Exam Vitals and nursing note reviewed.  Constitutional:      Appearance: Normal appearance. She is well-developed and well-groomed.  HENT:     Head: Normocephalic and atraumatic.  Eyes:     Conjunctiva/sclera: Conjunctivae normal.     Pupils: Pupils are equal, round, and reactive to light.  Cardiovascular:     Rate and Rhythm: Normal rate and regular rhythm.     Heart sounds: Normal heart sounds. No murmur.  Pulmonary:     Effort: Pulmonary effort is normal.     Breath sounds: Normal breath sounds.  Abdominal:     General: Abdomen is flat. Bowel sounds are normal.     Tenderness: There is no abdominal tenderness.  Skin:    General: Skin is warm and dry.  Neurological:     General: No focal deficit present.     Mental Status: She is alert and oriented to person, place, and time. Mental status is at baseline.     Gait: Gait  normal.  Psychiatric:        Attention and Perception: Attention and perception normal.        Mood and Affect: Mood and affect normal.        Speech: Speech normal.        Behavior: Behavior normal. Behavior is cooperative.        Thought Content: Thought content normal.        Cognition and Memory: Cognition and memory normal.        Judgment: Judgment normal.     Assessment  Plan  Weight loss Gastroesophageal reflux disease without esophagitis - Plan: famotidine (PEPCID) 20 MG tablet Pancreatic cyst enlarging last imaging 10/2019 MRI/MRCP -rec EUS and bx Dr. Francella Solian [facility] urgent referral down 6 lbs low BMI  prilosec 40  mg qd and pepcid 20 mg qhs  Increase premier shake to bid   HM Flu shot utd pna 23 had10/1/16 prevnardiscprev.pt will think about it Given Rx shingrixprev Tdap had 10/11/12  zostervax had  Hep C neg 01/07/19  covid vx had 2/2   Never smoker  No need for pap s/p hysterectomy DUB, endometriosis  Mammogram 09/18/19  A1C 5.6 dexa had 11/15/2017 +osteoporosis -2.9 left femur will repeatordered -Pt never took fosamax disc proliaprev.declines 2/2 c/w side effects  Colonoscopy h/o polyp tubular 05/03/15 Had repeat EGD/colonoscopy 06/17/2019 [facility]. 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 Olivia Mackie McLean-Scocuzza-Internal Medicine

## 2020-03-03 NOTE — Patient Instructions (Addendum)
Diagnoses  Pancreas cyst    Procedures  MRI abdomen and MRCP w wo contrast w 3D  Oran Rein, MD  [place], [place] [address]  Phone: [phone]  Fax: [phone]    Consider PEPCID 20 mg daily at night     Gastroesophageal Reflux Disease, Adult Gastroesophageal reflux (GER) happens when acid from the stomach flows up into the tube that connects the mouth and the stomach (esophagus). Normally, food travels down the esophagus and stays in the stomach to be digested. However, when a person has GER, food and stomach acid sometimes move back up into the esophagus. If this becomes a more serious problem, the person may be diagnosed with a disease called gastroesophageal reflux disease (GERD). GERD occurs when the reflux:  Happens often.  Causes frequent or severe symptoms.  Causes problems such as damage to the esophagus. When stomach acid comes in contact with the esophagus, the acid may cause soreness (inflammation) in the esophagus. Over time, GERD may create small holes (ulcers) in the lining of the esophagus. What are the causes? This condition is caused by a problem with the muscle between the esophagus and the stomach (lower esophageal sphincter, or LES). Normally, the LES muscle closes after food passes through the esophagus to the stomach. When the LES is weakened or abnormal, it does not close properly, and that allows food and stomach acid to go back up into the esophagus. The LES can be weakened by certain dietary substances, medicines, and medical conditions, including:  Tobacco use.  Pregnancy.  Having a hiatal hernia.  Alcohol use.  Certain foods and beverages, such as coffee, chocolate, onions, and peppermint. What increases the risk? You are more likely to develop this condition if you:  Have an increased body weight.  Have a connective tissue disorder.  Use NSAID medicines. What are the signs or symptoms? Symptoms  of this condition include:  Heartburn.  Difficult or painful swallowing.  The feeling of having a lump in the throat.  Abitter taste in the mouth.  Bad breath.  Having a large amount of saliva.  Having an upset or bloated stomach.  Belching.  Chest pain. Different conditions can cause chest pain. Make sure you see your health care provider if you experience chest pain.  Shortness of breath or wheezing.  Ongoing (chronic) cough or a night-time cough.  Wearing away of tooth enamel.  Weight loss. How is this diagnosed? Your health care provider will take a medical history and perform a physical exam. To determine if you have mild or severe GERD, your health care provider may also monitor how you respond to treatment. You may also have tests, including:  A test to examine your stomach and esophagus with a small camera (endoscopy).  A test thatmeasures the acidity level in your esophagus.  A test thatmeasures how much pressure is on your esophagus.  A barium swallow or modified barium swallow test to show the shape, size, and functioning of your esophagus. How is this treated? The goal of treatment is to help relieve your symptoms and to prevent complications. Treatment for this condition may vary depending on how severe your symptoms are. Your health care provider may recommend:  Changes to your diet.  Medicine.  Surgery. Follow these instructions at home: Eating and drinking   Follow a diet as recommended by your health care provider. This may involve avoiding foods and drinks such as: ? Coffee and tea (with or  without caffeine). ? Drinks that containalcohol. ? Energy drinks and sports drinks. ? Carbonated drinks or sodas. ? Chocolate and cocoa. ? Peppermint and mint flavorings. ? Garlic and onions. ? Horseradish. ? Spicy and acidic foods, including peppers, chili powder, curry powder, vinegar, hot sauces, and barbecue sauce. ? Citrus fruit juices and  citrus fruits, such as oranges, lemons, and limes. ? Tomato-based foods, such as red sauce, chili, salsa, and pizza with red sauce. ? Fried and fatty foods, such as donuts, french fries, potato chips, and high-fat dressings. ? High-fat meats, such as hot dogs and fatty cuts of red and white meats, such as rib eye steak, sausage, ham, and bacon. ? High-fat dairy items, such as whole milk, butter, and cream cheese.  Eat small, frequent meals instead of large meals.  Avoid drinking large amounts of liquid with your meals.  Avoid eating meals during the 2-3 hours before bedtime.  Avoid lying down right after you eat.  Do not exercise right after you eat. Lifestyle   Do not use any products that contain nicotine or tobacco, such as cigarettes, e-cigarettes, and chewing tobacco. If you need help quitting, ask your health care provider.  Try to reduce your stress by using methods such as yoga or meditation. If you need help reducing stress, ask your health care provider.  If you are overweight, reduce your weight to an amount that is healthy for you. Ask your health care provider for guidance about a safe weight loss goal. General instructions  Pay attention to any changes in your symptoms.  Take over-the-counter and prescription medicines only as told by your health care provider. Do not take aspirin, ibuprofen, or other NSAIDs unless your health care provider told you to do so.  Wear loose-fitting clothing. Do not wear anything tight around your waist that causes pressure on your abdomen.  Raise (elevate) the head of your bed about 6 inches (15 cm).  Avoid bending over if this makes your symptoms worse.  Keep all follow-up visits as told by your health care provider. This is important. Contact a health care provider if:  You have: ? New symptoms. ? Unexplained weight loss. ? Difficulty swallowing or it hurts to swallow. ? Wheezing or a persistent cough. ? A hoarse voice.  Your  symptoms do not improve with treatment. Get help right away if you:  Have pain in your arms, neck, jaw, teeth, or back.  Feel sweaty, dizzy, or light-headed.  Have chest pain or shortness of breath.  Vomit and your vomit looks like blood or coffee grounds.  Faint.  Have stool that is bloody or black.  Cannot swallow, drink, or eat. Summary  Gastroesophageal reflux happens when acid from the stomach flows up into the esophagus. GERD is a disease in which the reflux happens often, causes frequent or severe symptoms, or causes problems such as damage to the esophagus.  Treatment for this condition may vary depending on how severe your symptoms are. Your health care provider may recommend diet and lifestyle changes, medicine, or surgery.  Contact a health care provider if you have new or worsening symptoms.  Take over-the-counter and prescription medicines only as told by your health care provider. Do not take aspirin, ibuprofen, or other NSAIDs unless your health care provider told you to do so.  Keep all follow-up visits as told by your health care provider. This is important. This information is not intended to replace advice given to you by your health care provider. Make  sure you discuss any questions you have with your health care provider. Document Revised: 06/05/2018 Document Reviewed: 06/05/2018 Elsevier Patient Education  2020 [place] for Gastroesophageal Reflux Disease, Adult When you have gastroesophageal reflux disease (GERD), the foods you eat and your eating habits are very important. Choosing the right foods can help ease the discomfort of GERD. Consider working with a diet and nutrition specialist (dietitian) to help you make healthy food choices. What general guidelines should I follow?  Eating plan  Choose healthy foods low in fat, such as fruits, vegetables, whole grains, low-fat dairy products, and lean meat, fish, and poultry.  Eat  frequent, small meals instead of three large meals each day. Eat your meals slowly, in a relaxed setting. Avoid bending over or lying down until 2-3 hours after eating.  Limit high-fat foods such as fatty meats or fried foods.  Limit your intake of oils, butter, and shortening to less than 8 teaspoons each day.  Avoid the following: ? Foods that cause symptoms. These may be different for different people. Keep a food diary to keep track of foods that cause symptoms. ? Alcohol. ? Drinking large amounts of liquid with meals. ? Eating meals during the 2-3 hours before bed.  Cook foods using methods other than frying. This may include baking, grilling, or broiling. Lifestyle  Maintain a healthy weight. Ask your health care provider what weight is healthy for you. If you need to lose weight, work with your health care provider to do so safely.  Exercise for at least 30 minutes on 5 or more days each week, or as told by your health care provider.  Avoid wearing clothes that fit tightly around your waist and chest.  Do not use any products that contain nicotine or tobacco, such as cigarettes and e-cigarettes. If you need help quitting, ask your health care provider.  Sleep with the head of your bed raised. Use a wedge under the mattress or blocks under the bed frame to raise the head of the bed. What foods are not recommended? The items listed may not be a complete list. Talk with your dietitian about what dietary choices are best for you. Grains Pastries or quick breads with added fat. [place] toast. Vegetables Deep fried vegetables. [place] fries. Any vegetables prepared with added fat. Any vegetables that cause symptoms. For some people this may include tomatoes and tomato products, chili peppers, onions and garlic, and horseradish. Fruits Any fruits prepared with added fat. Any fruits that cause symptoms. For some people this may include citrus fruits, such as oranges, grapefruit,  pineapple, and lemons. Meats and other protein foods High-fat meats, such as fatty beef or pork, hot dogs, ribs, ham, sausage, salami and bacon. Fried meat or protein, including fried fish and fried chicken. Nuts and nut butters. Dairy Whole milk and chocolate milk. Sour cream. Cream. Ice cream. Cream cheese. Milk shakes. Beverages Coffee and tea, with or without caffeine. Carbonated beverages. Sodas. Energy drinks. Fruit juice made with acidic fruits (such as orange or grapefruit). Tomato juice. Alcoholic drinks. Fats and oils Butter. Margarine. Shortening. Ghee. Sweets and desserts Chocolate and cocoa. Donuts. Seasoning and other foods Pepper. Peppermint and spearmint. Any condiments, herbs, or seasonings that cause symptoms. For some people, this may include curry, hot sauce, or vinegar-based salad dressings. Summary  When you have gastroesophageal reflux disease (GERD), food and lifestyle choices are very important to help ease the discomfort of GERD.  Eat frequent, small meals instead of  three large meals each day. Eat your meals slowly, in a relaxed setting. Avoid bending over or lying down until 2-3 hours after eating.  Limit high-fat foods such as fatty meat or fried foods. This information is not intended to replace advice given to you by your health care provider. Make sure you discuss any questions you have with your health care provider. Document Revised: 03/20/2019 Document Reviewed: 11/28/2016 Elsevier Patient Education  [place].

## 2020-03-15 DIAGNOSIS — K862 Cyst of pancreas: Secondary | ICD-10-CM | POA: Diagnosis not present

## 2020-03-19 ENCOUNTER — Telehealth: Payer: Self-pay | Admitting: Internal Medicine

## 2020-03-19 NOTE — Telephone Encounter (Signed)
I called [facility] to follow up on a referral to check if it was scheduled.

## 2020-03-26 ENCOUNTER — Encounter: Payer: Self-pay | Admitting: Internal Medicine

## 2020-03-29 ENCOUNTER — Telehealth: Payer: Self-pay | Admitting: Internal Medicine

## 2020-03-29 NOTE — Telephone Encounter (Signed)
Please fax note attn Dr. Francella Solian below to review message between the patient and I Dr. Olivia Mackie McLean-Scocuzza   You have had a mammogram in 09/2019 which was a diagnostic mammogram and normal  You have also had a PET scan in 07/2019 which did not indicate a reason for left breast pain  Also the heart doctor 12/2019 did not think left chest pain was cardiac   I am concerned this could be referred pain from your pancreas and I really want you to discuss this with the doctor at [facility]   [place], [place] [id number]   [place], [place], MD   [address] [place], [place] [address]   [phone]   [phone] (Fax)    Also call/Contact Dr. Emeline Gins office and emphasize your weight loss though you are eating and your left upper abdomen/left chest/breast pain   It appears you have imaging coming up 04/2020 but I wanted them to consider a biopsy of the pancreatic lesion to make sure this is not cancer   [place]  ===View-only below this line===   ----- Message -----      Evelyn Barker      Sent:03/26/2020 11:43 AM EDT        [id number] N McLean-Scocuzza, MD   Subject:Visit Follow-Up Question  I continue having these pains around my left breast, more often and sometimes abrupt, sever (pain level 9) We must figure out why.  I am scheduled for a MRI May 10 at [facility]  at 1:30pm.  Would/ could  this MRI for the cyst on my pancreas include the area of my left breast (and liver lesion).  Please respond. [name]

## 2020-03-30 NOTE — Telephone Encounter (Signed)
Faxed

## 2020-05-12 DIAGNOSIS — K862 Cyst of pancreas: Secondary | ICD-10-CM | POA: Diagnosis not present

## 2020-05-13 DIAGNOSIS — H2513 Age-related nuclear cataract, bilateral: Secondary | ICD-10-CM | POA: Diagnosis not present

## 2020-05-25 ENCOUNTER — Ambulatory Visit (INDEPENDENT_AMBULATORY_CARE_PROVIDER_SITE_OTHER): Payer: Medicare Other | Admitting: Internal Medicine

## 2020-05-25 ENCOUNTER — Other Ambulatory Visit: Payer: Self-pay

## 2020-05-25 ENCOUNTER — Encounter: Payer: Self-pay | Admitting: Internal Medicine

## 2020-05-25 VITALS — BP 120/72 | HR 75 | Temp 97.5°F | Ht 64.0 in | Wt 92.8 lb

## 2020-05-25 DIAGNOSIS — Z1231 Encounter for screening mammogram for malignant neoplasm of breast: Secondary | ICD-10-CM

## 2020-05-25 DIAGNOSIS — K8689 Other specified diseases of pancreas: Secondary | ICD-10-CM

## 2020-05-25 DIAGNOSIS — F419 Anxiety disorder, unspecified: Secondary | ICD-10-CM | POA: Diagnosis not present

## 2020-05-25 DIAGNOSIS — Z23 Encounter for immunization: Secondary | ICD-10-CM

## 2020-05-25 DIAGNOSIS — R1013 Epigastric pain: Secondary | ICD-10-CM

## 2020-05-25 DIAGNOSIS — K862 Cyst of pancreas: Secondary | ICD-10-CM

## 2020-05-25 DIAGNOSIS — R634 Abnormal weight loss: Secondary | ICD-10-CM

## 2020-05-25 MED ORDER — SHINGRIX 50 MCG/0.5ML IM SUSR: 0.5000 mL | Freq: Once | INTRAMUSCULAR | 0 refills | Status: AC

## 2020-05-25 NOTE — Progress Notes (Signed)
Chief Complaint  Patient presents with  . Follow-up  . Weight Loss   F/u  1. Anxiety increased GAD score 8 today  2. 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   Technique: Precontrast and dynamic postcontrast MR imaging of theabdomen  was performed using the Liver Protocol. IV contrast was administered to  improve disease detection and further define anatomy. MRCP was also  performed, with 3-D reconstruction performed to evaluate biliary anatomy.   Premedication/adverse events: None.   FINDINGS: No free air, free fluid or fluid collection.   Lower chest: The visualized lungs are aerated. No pleural or pericardial  effusion.   ABDOMEN:   Liver: The liver enhances homogeneously without suspicious mass.  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   Gallbladder and biliary: Normal gallbladder without cholelithiasis. Normal  caliber bile ducts.   Spleen: Normal spleen.   Pancreas: Cystic lesion in the pancreatic head/neck measures 2.0 cm with  intrinsic enhancing septations, unchanged from prior exam (series 4, image  15). No intrinsic enhancing nodularity. Normal caliber main pancreatic  duct. Additional adjacent subcentimeter lesion is unchanged.   Adrenal glands: Normal adrenal glands.   Kidneys and ureters: Normal kidneys and ureters.   GI tract: The stomach is decompressed and poorly evaluated. Normal caliber  small bowel and colon.   Vascular structures: Normal caliber abdominal aorta. The celiac axis, SMA,  bilateral renal arteries, and IMA are patent. Portal and mesenteric venous  structures are patent.    Lymph nodes: No abdominal lymphadenopathy.   Bones: Unremarkable.   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 Normal caliber main pancreatic duct. Continued attention on  follow-up imaging is recommended.   Electronically Reviewed by: Mercie Eon, DO, [facility]  Electronically Reviewed on: 05/13/2020 8:58 AM   I have reviewed the images and concur with the above findings.     Review of Systems  Constitutional: Positive for weight loss.  HENT: Negative for hearing loss.   Eyes: Negative for blurred vision.  Respiratory: Negative for shortness of breath.   Cardiovascular: Negative for chest pain.  Gastrointestinal: Negative for abdominal pain.  Musculoskeletal: Negative for falls.  Skin: Negative for rash.  Psychiatric/Behavioral: The patient is nervous/anxious.    Past Medical History:  Diagnosis Date  . Anxiety    knees, low back   . Arthritis   . History of chicken pox   . History of macular degeneration   . History of shingles    x3   . Osteoporosis   . Osteoporosis    declines medication   . 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 ABDOMINAL HYSTERECTOMY     DUB, endometriosis no h/o abnormal pap   . APPENDECTOMY    . COLONOSCOPY N/A 04/30/2015   Procedure: COLONOSCOPY;  Surgeon: Josefine Class, MD;  Location: [facility] ENDOSCOPY;  Service: Endoscopy;  Laterality: N/A;  . TONSILLECTOMY     Family History  Problem Relation Age of Onset  . Stroke Mother   . Cancer Father  prostate   . Pneumonia Maternal Grandmother   . Hip fracture Maternal Grandmother   . Stroke Paternal Grandfather   . Skin cancer Other   . Breast cancer Neg Hx    Social History   Socioeconomic History  . Marital status: Widowed    Spouse name: Not on file  . Number of  children: Not on file  . Years of education: Not on file  . Highest education level: Not on file  Occupational History  . Not on file  Tobacco Use  . Smoking status: Never Smoker  . Smokeless tobacco: Never Used  Substance and Sexual Activity  . Alcohol use: Not Currently  . Drug use: Not Currently  . Sexual activity: Not Currently    Partners: Male  Other Topics Concern  . 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 Difficulty of Paying Living Expenses:   Food Insecurity:   . Worried About Charity fundraiser in the Last Year:   . Arboriculturist in the Last Year:   Transportation Needs:   . Film/video editor (Medical):   Marland Kitchen Lack of Transportation (Non-Medical):   Physical Activity:   . Days of Exercise per Week:   . Minutes of Exercise per Session:   Stress:   . Feeling of Stress :   Social Connections:   . Frequency of Communication with Friends and Family:   . Frequency of Social Gatherings with Friends and Family:   . Attends Religious Services:   . Active Member of Clubs or Organizations:   . Attends Archivist Meetings:   Marland Kitchen Marital Status:   Intimate Partner Violence:   . Fear of Current or Ex-Partner:   . Emotionally Abused:   Marland Kitchen Physically Abused:   . Sexually Abused:    Current Meds  Medication Sig  . docusate sodium (COLACE) 100 MG capsule Take 1 capsule (100 mg total) by mouth daily as needed for mild constipation.  . multivitamin-lutein (OCUVITE-LUTEIN) CAPS capsule Take 1 capsule by mouth every other day.  . triamcinolone cream (KENALOG) 0.1 % Apply 1 application topically 2 (two) times daily.  Right upper back as needed   Allergies  Allergen Reactions  . Amoxil [Amoxicillin]   . Tylenol [Acetaminophen]     ?reaction    . Ampicillin Rash  . Keflex [Cephalexin] Rash  . Macrobid [Nitrofurantoin] Rash  . Ofloxacin Rash  . Penicillins Rash  . Sulfur Rash   No results found for this or any previous visit (from the past 2160 hour(s)). Objective  Body mass index is 15.93 kg/m. Wt Readings from Last 3 Encounters:  05/25/20 92 lb 12.8 oz (42.1 kg)  03/03/20 92 lb 9.6 oz (42 kg)  01/08/20 98 lb (44.5 kg)   Temp Readings from Last 3 Encounters:  05/25/20 (!) 97.5 F (36.4 C) (Temporal)  03/03/20 97.8 F (36.6 C) (Temporal)  01/08/20 98.2 F (36.8 C) (Oral)   BP Readings from Last 3 Encounters:  05/25/20  120/72  03/03/20 116/74  01/08/20 (!) 175/87   Pulse Readings from Last 3 Encounters:  05/25/20 75  03/03/20 80  01/08/20 71    Physical Exam Vitals and nursing note reviewed.  Constitutional:      Appearance: Normal appearance. She is well-developed and well-groomed.  HENT:     Head: Normocephalic and atraumatic.  Eyes:     Conjunctiva/sclera: Conjunctivae normal.     Pupils: Pupils are equal, round, and reactive to light.  Cardiovascular:     Rate and Rhythm: Normal rate and regular rhythm.     Heart sounds: Normal heart sounds. No murmur heard.   Pulmonary:     Effort: Pulmonary effort is normal.     Breath sounds: Normal breath sounds.  Skin:    General: Skin is warm and dry.  Neurological:     General: No focal deficit present.     Mental Status: She is alert and oriented to person, place, and time. Mental status is at baseline.     Gait: Gait normal.  Psychiatric:        Attention and Perception: Attention and perception normal.        Mood and Affect: Mood and affect normal.        Speech: Speech normal.        Behavior: Behavior normal. Behavior is cooperative.        Thought Content: Thought content normal.        Cognition and Memory:  Cognition and memory normal.        Judgment: Judgment normal.     Assessment  Plan  Pancreatic mass/cyst - Plan: Ambulatory referral to Gastroenterology Dr. Tillie Rung EUS with bx at [facility] with epigastric pain, weight loss, and cyst in head/neck pancreas  Anxiety Not taking xanax prn  celexa consider in future  HM Flu shotutd pna 23 had10/1/16 prevnardiscprev.pt will think about it Given Rx shingrixprevRx 2/2 given today again Tdap had 10/11/12  zostervax had Hep C neg 01/07/19 covid vx had 2/2   Never smoker  No need for pap s/p hysterectomy DUB, endometriosis  Mammogram10/8/20ordered 09/17/20 A1C 5.6 dexa had 11/15/2017 +osteoporosis -2.9 left femur will repeatordered -Pt never took fosamax disc proliaprev.declines 2/2 c/w side effects  Colonoscopy h/o polyp tubular 05/03/15 Had repeat EGD/colonoscopy 06/17/2019 [facility]. 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 Olivia Mackie McLean-Scocuzza-Internal Medicine

## 2020-05-25 NOTE — Patient Instructions (Addendum)
Consider low dose medication Celexa for anxiety in the future   Dr. 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   Technique: Precontrast and dynamic postcontrast MR imaging of theabdomen  was performed using the Liver Protocol. IV contrast was administered to  improve disease detection and further define anatomy. MRCP was also  performed, with 3-D reconstruction performed to evaluate biliary anatomy.   Premedication/adverse events: None.   FINDINGS: No free air, free fluid or fluid collection.   Lower chest: The visualized lungs are aerated. No pleural or pericardial  effusion.   ABDOMEN:   Liver: The liver enhances homogeneously without suspicious mass.  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 Normal  caliber bile ducts.   Spleen: Normal spleen.   Pancreas: Cystic lesion in the pancreatic head/neck measures 2.0 cm with  intrinsic enhancing septations, unchanged from prior exam (series 4, image  15). No intrinsic enhancing nodularity. Normal caliber main pancreatic  duct. Additional adjacent subcentimeter lesion is unchanged.   Adrenal glands: Normal adrenal glands.   Kidneys and ureters: Normal kidneys and ureters.   GI tract: The stomach is decompressed and poorly evaluated. Normal caliber  small bowel and colon.   Vascular structures: Normal caliber abdominal aorta. The celiac axis, SMA,  bilateral renal arteries, and IMA are patent. Portal and mesenteric venous  structures are patent.   Lymph nodes:  No abdominal lymphadenopathy.   Bones: Unremarkable.   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 Normal caliber main pancreatic duct. Continued attention on  follow-up imaging is recommended.   Electronically Reviewed by: Mercie Eon, DO, [facility]  Electronically Reviewed on: 05/13/2020 8:58 AM   I have reviewed the images and concur with the above findings.    Citalopram tablets What is this medicine? CITALOPRAM (sye TAL oh pram) is a medicine for depression. This medicine may be used for other purposes; ask your health care provider or pharmacist if you have questions. COMMON BRAND NAME(S): Celexa What should I tell my health care provider before I take this medicine? They need to know if you have any of these conditions:  bleeding disorders  bipolar disorder or a family history of bipolar disorder  glaucoma  heart disease  history of irregular heartbeat  kidney disease  liver disease  low levels of magnesium or potassium in the blood  receiving electroconvulsive therapy  seizures  suicidal thoughts, plans, or attempt; a previous suicide attempt by you or a family member  take medicines that treat or prevent blood clots  thyroid disease  an unusual or allergic reaction to citalopram, escitalopram, other medicines, foods, dyes, or preservatives  pregnant or trying to become pregnant  breast-feeding How should I use this medicine? Take this medicine by mouth with a glass of water. Follow the directions on the prescription label. You can take it with or without food. Take your medicine at regular intervals. Do not take your medicine more often than directed. Do not stop taking this medicine suddenly except upon the advice of your doctor. Stopping this  medicine too quickly may cause serious side effects or your condition may  worsen. A special MedGuide will be given to you by the pharmacist with each prescription and refill. Be sure to read this information carefully each time. Talk to your pediatrician regarding the use of this medicine in children. Special care may be needed. Patients over 29 years old may have a stronger reaction and need a smaller dose. Overdosage: If you think you have taken too much of this medicine contact a poison control center or emergency room at once. NOTE: This medicine is only for you. Do not share this medicine with others. What if I miss a dose? If you miss a dose, take it as soon as you can. If it is almost time for your next dose, take only that dose. Do not take double or extra doses. What may interact with this medicine? Do not take this medicine with any of the following medications:  certain medicines for fungal infections like fluconazole, itraconazole, ketoconazole, posaconazole, voriconazole  cisapride  dronedarone  escitalopram  linezolid  MAOIs like Carbex, Eldepryl, Marplan, Nardil, and Parnate  methylene blue (injected into a vein)  pimozide  thioridazine This medicine may also interact with the following medications:  alcohol  amphetamines  aspirin and aspirin-like medicines  carbamazepine  certain medicines for depression, anxiety, or psychotic disturbances  certain medicines for infections like chloroquine, clarithromycin, erythromycin, furazolidone, isoniazid, pentamidine  certain medicines for migraine headaches like almotriptan, eletriptan, frovatriptan, naratriptan, rizatriptan, sumatriptan, zolmitriptan  certain medicines for sleep  certain medicines that treat or prevent blood clots like dalteparin, enoxaparin, warfarin  cimetidine  diuretics  dofetilide  fentanyl  lithium  methadone  metoprolol  NSAIDs, medicines for pain and inflammation, like ibuprofen or naproxen  omeprazole  other medicines that prolong the QT  interval (cause an abnormal heart rhythm)  procarbazine  rasagiline  supplements like St. John's wort, kava kava, valerian  tramadol  tryptophan  ziprasidone This list may not describe all possible interactions. Give your health care provider a list of all the medicines, herbs, non-prescription drugs, or dietary supplements you use. Also tell them if you smoke, drink alcohol, or use illegal drugs. Some items may interact with your medicine. What should I watch for while using this medicine? Tell your doctor if your symptoms do not get better or if they get worse. Visit your doctor or health care professional for regular checks on your progress. Because it may take several weeks to see the full effects of this medicine, it is important to continue your treatment as prescribed by your doctor. Patients and their families should watch out for new or worsening thoughts of suicide or depression. Also watch out for sudden changes in feelings such as feeling anxious, agitated, panicky, irritable, hostile, aggressive, impulsive, severely restless, overly excited and hyperactive, or not being able to sleep. If this happens, especially at the beginning of treatment or after a change in dose, call your health care professional. Dennis Bast may get drowsy or dizzy. Do not drive, use machinery, or do anything that needs mental alertness until you know how this medicine affects you. Do not stand or sit up quickly, especially if you are an older patient. This reduces the risk of dizzy or fainting spells. Alcohol may interfere with the effect of this medicine. Avoid alcoholic drinks. Your mouth may get dry. Chewing sugarless gum or sucking hard candy, and drinking plenty of water will help. Contact your doctor if the problem does not go away  or is severe. What side effects may I notice from receiving this medicine? Side effects that you should report to your doctor or health care professional as soon as  possible:  allergic reactions like skin rash, itching or hives, swelling of the face, lips, or tongue  anxious  black, tarry stools  breathing problems  changes in vision  chest pain  confusion  elevated mood, decreased need for sleep, racing thoughts, impulsive behavior  eye pain  fast, irregular heartbeat  feeling faint or lightheaded, falls  feeling agitated, angry, or irritable  hallucination, loss of contact with reality  loss of balance or coordination  loss of memory  painful or prolonged erections  restlessness, pacing, inability to keep still  seizures  stiff muscles  suicidal thoughts or other mood changes  trouble sleeping  unusual bleeding or bruising  unusually weak or tired  vomiting Side effects that usually do not require medical attention (report to your doctor or health care professional if they continue or are bothersome):  change in appetite or weight  change in sex drive or performance  dizziness  headache  increased sweating  indigestion, nausea  tremors This list may not describe all possible side effects. Call your doctor for medical advice about side effects. You may report side effects to FDA at 1-800-FDA-1088. Where should I keep my medicine? Keep out of reach of children. Store at room temperature between 15 and 30 degrees C (59 and 86 degrees F). Throw away any unused medicine after the expiration date. NOTE: This sheet is a summary. It may not cover all possible information. If you have questions about this medicine, talk to your doctor, pharmacist, or health care provider.  2020 Elsevier/Gold Standard (2018-11-18 09:05:36)

## 2020-06-25 ENCOUNTER — Other Ambulatory Visit: Payer: Self-pay

## 2020-06-25 ENCOUNTER — Ambulatory Visit (INDEPENDENT_AMBULATORY_CARE_PROVIDER_SITE_OTHER): Payer: Medicare Other | Admitting: Podiatry

## 2020-06-25 ENCOUNTER — Encounter: Payer: Self-pay | Admitting: Podiatry

## 2020-06-25 DIAGNOSIS — L600 Ingrowing nail: Secondary | ICD-10-CM

## 2020-06-25 MED ORDER — GENTAMICIN SULFATE 0.1 % EX CREA: 1.0000 "application " | TOPICAL_CREAM | Freq: Two times a day (BID) | CUTANEOUS | 1 refills | Status: DC

## 2020-06-25 NOTE — Progress Notes (Signed)
   Subjective: Patient presents today for evaluation of pain to the lateral border right great toe. Patient is concerned for possible ingrown nail.  Patient has had nail procedures performed in the past to the same area of the toenail.  She has a history of recurrent ingrown's.  Patient presents today for further treatment and evaluation.  Past Medical History:  Diagnosis Date  . Anxiety    knees, low back   . Arthritis   . History of chicken pox   . History of macular degeneration   . History of shingles    x3   . Osteoporosis   . Osteoporosis    declines medication   . 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  Lateral border right great toe appears to be erythematous with evidence of an ingrowing nail. Pain on palpation noted to the border of the nail fold. The remaining nails appear unremarkable at this time. There are no open sores, lesions.  Vascular: Dorsalis Pedis artery and Posterior Tibial artery pedal pulses palpable. No lower extremity edema noted.   Neruologic: Grossly intact via light touch bilateral.  Musculoskeletal: Muscular strength within normal limits in all groups bilateral. Normal range of motion noted to all pedal and ankle joints.   Assesement: #1 Paronychia with ingrowing nail lateral border right great toe #2 Pain in toe #3 Incurvated nail  Plan of Care:  1. Patient evaluated.  2. Discussed treatment alternatives and plan of care. Explained nail avulsion procedure and post procedure course to patient. 3. Patient opted for permanent partial nail avulsion of the lateral border right great toe.  4. Prior to procedure, local anesthesia infiltration utilized using 3 ml of a 50:50 mixture of 2% plain lidocaine and 0.5% plain marcaine in a normal hallux block fashion and a betadine prep performed.    5. Partial permanent nail avulsion with chemical matrixectomy performed using [id number] applications of phenol followed by alcohol flush.  6. Light dressing applied. 7.  Prescription for gentamicin cream  Eight.  Return to clinic 2 weeks.  Edrick Kins, DPM [facility]  Dr. Edrick Kins, [address]                                        [place], [place] [address]                Office [phone]  Fax [phone]

## 2020-07-13 ENCOUNTER — Telehealth: Payer: Self-pay | Admitting: Internal Medicine

## 2020-07-13 ENCOUNTER — Encounter: Payer: Self-pay | Admitting: Podiatry

## 2020-07-13 ENCOUNTER — Ambulatory Visit (INDEPENDENT_AMBULATORY_CARE_PROVIDER_SITE_OTHER): Payer: Medicare Other | Admitting: Podiatry

## 2020-07-13 ENCOUNTER — Other Ambulatory Visit: Payer: Self-pay

## 2020-07-13 DIAGNOSIS — L600 Ingrowing nail: Secondary | ICD-10-CM | POA: Diagnosis not present

## 2020-07-13 NOTE — Progress Notes (Signed)
   Subjective: 74 y.o. female presents today status post permanent nail avulsion procedure of the lateral border of the right great toe that was performed on 06/25/2020.  Patient states she is doing much better however she does have some sensitivity to the area of the avulsion site.  She has been soaking her foot and slowly has discontinued it over the last week.  She is also been applying the antibiotic cream and Band-Aid as directed.  No new complaints at this time  Past Medical History:  Diagnosis Date  . Anxiety    knees, low back   . Arthritis   . History of chicken pox   . History of macular degeneration   . History of shingles    x3   . Osteoporosis   . Osteoporosis    declines medication   . 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 Nail and respective nail fold appears to be healing appropriately. Open wound to the associated nail fold with a granular wound base and moderate amount of fibrotic tissue. Minimal drainage noted. Mild erythema around the periungual region likely due to phenol chemical matricectomy.  There is some hyperkeratotic callus tissue noted to the toenail avulsion site that is well adhered.  Light debridement was performed is very sensitive to touch.  Fortunately there is no evidence of infection.  There is no purulent drainage.  No erythema around the area other than the mild reaction to the chemical matricectomy.  No malodor noted.  Assessment: #1 postop permanent partial nail avulsion lateral border right hallux #2 open wound periungual nail fold of respective digit.  #3 well adhered hyperkeratotic callus lateral nail fold right hallux  Plan of care: #1 patient was evaluated  #2 debridement of open wound was performed to the periungual border of the respective toe using a currette. Antibiotic ointment and Band-Aid was applied. #3 patient is to return to clinic on a PRN basis.   Edrick Kins,  DPM [facility]  Dr. Edrick Kins, [address]                                        [place], [place] [address]                Office [phone]  Fax [phone]

## 2020-07-13 NOTE — Telephone Encounter (Signed)
Left message for patient to call back and schedule Medicare Annual Wellness Visit (AWV)  ° °This should be a telephone visit only=30 minutes. ° °No hx of AWV; please schedule at anytime with Denisa O'Brien-Blaney at [facility] ° ° °

## 2020-07-22 ENCOUNTER — Ambulatory Visit (INDEPENDENT_AMBULATORY_CARE_PROVIDER_SITE_OTHER): Payer: Medicare Other

## 2020-07-22 VITALS — Ht 64.5 in | Wt 92.0 lb

## 2020-07-22 DIAGNOSIS — Z0000 Encounter for general adult medical examination without abnormal findings: Secondary | ICD-10-CM

## 2020-07-22 NOTE — Progress Notes (Signed)
Subjective:   Nakira Litzau is a 74 y.o. female who presents for an Initial Medicare Annual Wellness Visit.  Review of Systems    No ROS.  Medicare Wellness Virtual Visit.    Cardiac Risk Factors include: advanced age (>86men, >57 women)     Objective:    Today's Vitals   07/22/20 1105  Weight: 92 lb (41.7 kg)  Height: 5' 4.5" (1.638 m)   Body mass index is 15.55 kg/m.  Advanced Directives 07/22/2020 01/08/2020 07/28/2019  Does Patient Have a Medical Advance Directive? Yes Yes Yes  Type of Paramedic of [place];Living will [place];Living will [place];Living will;Out of facility DNR (pink MOST or yellow form)  Does patient want to make changes to medical advance directive? No - Patient declined No - Patient declined No - Patient declined  Copy of [place] in Chart? No - copy requested No - copy requested No - copy requested  Would patient like information on creating a medical advance directive? - No - Patient declined -    Current Medications (verified) Outpatient Encounter Medications as of 07/22/2020  Medication Sig  . ALPRAZolam (XANAX) 0.25 MG tablet Take 1 tablet (0.25 mg total) by mouth at bedtime as needed for anxiety. (Patient not taking: Reported on 05/25/2020)  . docusate sodium (COLACE) 100 MG capsule Take 1 capsule (100 mg total) by mouth daily as needed for mild constipation.  . famotidine (PEPCID) 20 MG tablet Take 1 tablet (20 mg total) by mouth at bedtime. (Patient not taking: Reported on 05/25/2020)  . gentamicin cream (GARAMYCIN) 0.1 % Apply 1 application topically 2 (two) times daily.  Marland Kitchen ibuprofen (ADVIL,MOTRIN) 200 MG tablet Take 200 mg by mouth 2 (two) times daily as needed for moderate pain. (Patient not taking: Reported on 05/25/2020)  . multivitamin-lutein (OCUVITE-LUTEIN) CAPS capsule Take 1 capsule by mouth every other day.  Marland Kitchen omeprazole (PRILOSEC) 40 MG capsule Take 40 mg  by mouth daily. (Patient not taking: Reported on 05/25/2020)  . polyethylene glycol (MIRALAX / GLYCOLAX) 17 g packet Take 17 g by mouth daily as needed. (Patient not taking: Reported on 05/25/2020)  . triamcinolone cream (KENALOG) 0.1 % Apply 1 application topically 2 (two) times daily. Right upper back as needed   No facility-administered encounter medications on file as of 07/22/2020.    Allergies (verified) Amoxil [amoxicillin], Tylenol [acetaminophen], Ampicillin, Keflex [cephalexin], Macrobid [nitrofurantoin], Ofloxacin, Penicillins, and Sulfur   History: Past Medical History:  Diagnosis Date  . Anxiety    knees, low back   . Arthritis   . History of chicken pox   . History of macular degeneration   . History of shingles    x3   . Osteoporosis   . Osteoporosis    declines medication   . 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 ABDOMINAL HYSTERECTOMY     DUB, endometriosis no h/o abnormal pap   . APPENDECTOMY    . COLONOSCOPY N/A 04/30/2015   Procedure: COLONOSCOPY;  Surgeon: Josefine Class, MD;  Location: [facility] ENDOSCOPY;  Service: Endoscopy;  Laterality: N/A;  . TONSILLECTOMY     Family History  Problem Relation Age of Onset  . Stroke Mother   . Cancer Father        prostate   . Pneumonia Maternal Grandmother   . Hip fracture Maternal Grandmother   . Stroke Paternal Grandfather   .  Skin cancer Other   . Breast cancer Neg Hx    Social History   Socioeconomic History  . Marital status: Widowed    Spouse name: Not on file  . Number of children: Not on file  . Years of education: Not on file  . Highest education level: Not on file  Occupational History  . Not on file  Tobacco Use  . Smoking status: Never Smoker  . Smokeless tobacco: Never Used  Substance and Sexual Activity  . Alcohol use: Not Currently  . Drug use: Not Currently  . Sexual activity: Not Currently    Partners: Male    Other Topics Concern  . Not on file  Social History Narrative   Widowed    Grew up in [place] moved around when husband living    No kids    [place]    Never smoker    Likes to pain for [place]    Enjoys plays    No guns    Wears seat belt, safe in relationship   Retired Teacher, music [employer] [phone]    Social Determinants of Health   Financial Resource Strain: [place]   . Difficulty of Paying Living Expenses: Not hard at all  Food Insecurity: No Food Insecurity  . Worried About Charity fundraiser in the Last Year: Never true  . Ran Out of Food in the Last Year: Never true  Transportation Needs: No Transportation Needs  . Lack of Transportation (Medical): No  . Lack of Transportation (Non-Medical): No  Physical Activity: Unknown  . Days of Exercise per Week: 0 days  . Minutes of Exercise per Session: Not on file  Stress: No Stress Concern Present  . Feeling of Stress : Not at all  Social Connections: Unknown  . Frequency of Communication with Friends and Family: More than three times a week  . Frequency of Social Gatherings with Friends and Family: More than three times a week  . Attends Religious Services: Not on file  . Active Member of Clubs or Organizations: Not on file  . Attends Archivist Meetings: Not on file  . Marital Status: Widowed    Tobacco Counseling Counseling given: Not Answered   Clinical Intake:  Pre-visit preparation completed: Yes        Diabetes: No  How often do you need to have someone help you when you read instructions, pamphlets, or other written materials from your doctor or pharmacy?: 1 - Never  Interpreter Needed?: No      Activities of Daily Living In your present state of health, do you have any difficulty performing the following activities: 07/22/2020  Hearing? N  Vision? N  Difficulty concentrating or making decisions? N  Walking or climbing stairs? N  Dressing  or bathing? N  Doing errands, shopping? N  Preparing Food and eating ? N  Using the Toilet? N  In the past six months, have you accidently leaked urine? N  Do you have problems with loss of bowel control? N  Managing your Medications? N  Managing your Finances? N  Housekeeping or managing your Housekeeping? N  Some recent data might be hidden    Patient Care Team: McLean-Scocuzza, Nino Glow, MD as PCP - General (Internal Medicine)  Indicate any recent Medical Services you may have received from other than [facility] providers in the past year (date may be approximate).     Assessment:  This is a routine wellness examination for [place].  I connected with Christna today by telephone and verified that I am speaking with the correct person using two identifiers. Location patient: home Location provider: work Persons participating in the virtual visit: patient, Marine scientist.    I discussed the limitations, risks, security and privacy concerns of performing an evaluation and management service by telephone and the availability of in person appointments. The patient expressed understanding and verbally consented to this telephonic visit.    Interactive audio and video telecommunications were attempted between this provider and patient, however failed, due to patient having technical difficulties OR patient did not have access to video capability.  We continued and completed visit with audio only.  Some vital signs may be absent or patient reported.   Hearing/Vision screen  Hearing Screening   125Hz  250Hz  500Hz  1000Hz  2000Hz  3000Hz  4000Hz  6000Hz  8000Hz   Right ear:           Left ear:           Comments: Patient is able to hear conversational tones without difficulty.  No issues reported.  Vision Screening Comments: Followed by [facility] Wears corrective lenses Visual acuity not assessed, virtual visit.  They have seen their ophthalmologist in the last 12 months.    Dietary issues and  exercise activities discussed: Current Exercise Habits: Home exercise routine, Intensity: Mild  Regular diet  Goals    . Increase physical activity     Walk more for exercise.       Depression Screen PHQ 2/9 Scores 07/22/2020 05/25/2020 03/03/2020 11/21/2019 07/28/2019 07/23/2019 10/16/2018  PHQ - 2 Score 0 0 0 0 0 0 0  PHQ- 9 Score - - - - - 0 -    Fall Risk Fall Risk  07/22/2020 05/25/2020 03/03/2020 01/01/2020 11/21/2019  Falls in the past year? 0 0 0 0 0  Number falls in past yr: 0 0 0 0 -  Injury with Fall? - 0 0 0 -  Follow up Falls evaluation completed Falls evaluation completed Falls evaluation completed - -   Handrails in use when climbing stairs?Yes  Home free of loose throw rugs in walkways, pet beds, electrical cords, etc? Yes  Adequate lighting in your home to reduce risk of falls? Yes   ASSISTIVE DEVICES UTILIZED TO PREVENT FALLS:  Life alert? Yes  Use of a cane, walker or w/c? No  Grab bars in the bathroom? Yes  Shower chair or bench in shower? Yes  Elevated toilet seat or a handicapped toilet? Yes   TIMED UP AND GO:  Was the test performed? No . Virtual visit.   Cognitive Function:  Patient is alert and oriented x3.  6CIT Screen 07/22/2020  What Year? 0 points  What month? 0 points  What time? 0 points  Months in reverse 0 points  Repeat phrase 0 points    Immunizations Immunization History  Administered Date(s) Administered  . Fluad Quad(high Dose 65+) 08/28/2019  . Hep A / Hep B 09/03/2019  . Influenza-Unspecified 09/11/2015  . Moderna SARS-COVID-2 Vaccination 12/23/2019, 01/20/2020  . Pneumococcal Polysaccharide-23 09/11/2015  . Tdap 12/12/2011  . Zoster 12/12/2003    Health Maintenance Health Maintenance  Topic Date Due  . PNA vac Low Risk Adult (2 of 2 - PCV13) 09/10/2016  . INFLUENZA VACCINE  07/11/2020  . TETANUS/TDAP  10/11/2022  . COLONOSCOPY  04/29/2025  . DEXA SCAN  Completed  . COVID-19 Vaccine  Completed  . Hepatitis C Screening  Completed   Dental Screening: Recommended annual dental exams for proper oral hygiene. Visits every 6 months.   Community Resource Referral / Chronic Care Management: CRR required this visit?  No   CCM required this visit?  No      Plan:   Keep all routine maintenance appointments.   Follow up 07/27/20 @ 8:30; reports she is still losing weight without trying and pain around L rib cage has increased after standing long periods. Healthy diet. Notes these topics have been addressed in previous appointments with physician, however due to pain increase she would like to discuss it further with PMD and receive labs.   3 month follow up 08/27/20 @ 11:00  I have personally reviewed and noted the following in the patient's chart:   . Medical and social history . Use of alcohol, tobacco or illicit drugs  . Current medications and supplements . Functional ability and status . Nutritional status . Physical activity . Advanced directives . List of other physicians . Hospitalizations, surgeries, and ER visits in previous 12 months . Vitals . Screenings to include cognitive, depression, and falls . Referrals and appointments  In addition, I have reviewed and discussed with patient certain preventive protocols, quality metrics, and best practice recommendations. A written personalized care plan for preventive services as well as general preventive health recommendations were provided to patient via mychart.     Varney Biles, LPN   9/37/9024

## 2020-07-22 NOTE — Patient Instructions (Addendum)
Evelyn Barker , Thank you for taking time to come for your Medicare Wellness Visit. I appreciate your ongoing commitment to your health goals. Please review the following plan we discussed and let me know if I can assist you in the future.   These are the goals we discussed: Goals    . Increase physical activity     Walk more for exercise.        This is a list of the screening recommended for you and due dates:  Health Maintenance  Topic Date Due  . Pneumonia vaccines (2 of 2 - PCV13) 09/10/2016  . Flu Shot  07/11/2020  . Tetanus Vaccine  10/11/2022  . Colon Cancer Screening  04/29/2025  . DEXA scan (bone density measurement)  Completed  . COVID-19 Vaccine  Completed  .  Hepatitis C: One time screening is recommended by Center for Disease Control  (CDC) for  adults born from [birth year] through 1966.   Completed    Immunizations Immunization History  Administered Date(s) Administered  . Fluad Quad(high Dose 65+) 08/28/2019  . Hep A / Hep B 09/03/2019  . Influenza-Unspecified 09/11/2015  . Moderna SARS-COVID-2 Vaccination 12/23/2019, 01/20/2020  . Pneumococcal Polysaccharide-23 09/11/2015  . Tdap 12/12/2011  . Zoster 12/12/2003   Advanced directives: End of life planning; Advance aging; Advanced directives discussed.  Copy of current HCPOA/Living Will requested.    Conditions/risks identified: none new.  Follow up in one year for your annual wellness visit.   Preventive Care 75 Years and Older, Female Preventive care refers to lifestyle choices and visits with your health care provider that can promote health and wellness. What does preventive care include?  A yearly physical exam. This is also called an annual well check.  Dental exams once or twice a year.  Routine eye exams. Ask your health care provider how often you should have your eyes checked.  Personal lifestyle choices, including:  Daily care of your teeth and gums.  Regular physical activity.  Eating a  healthy diet.  Avoiding tobacco and drug use.  Limiting alcohol use.  Practicing safe sex.  Taking low-dose aspirin every day.  Taking vitamin and mineral supplements as recommended by your health care provider. What happens during an annual well check? The services and screenings done by your health care provider during your annual well check will depend on your age, overall health, lifestyle risk factors, and family history of disease. Counseling  Your health care provider may ask you questions about your:  Alcohol use.  Tobacco use.  Drug use.  Emotional well-being.  Home and relationship well-being.  Sexual activity.  Eating habits.  History of falls.  Memory and ability to understand (cognition).  Work and work Statistician.  Reproductive health. Screening  You may have the following tests or measurements:  Height, weight, and BMI.  Blood pressure.  Lipid and cholesterol levels. These may be checked every 5 years, or more frequently if you are over 3 years old.  Skin check.  Lung cancer screening. You may have this screening every year starting at age 75 if you have a 30-pack-year history of smoking and currently smoke or have quit within the past 15 years.  Fecal occult blood test (FOBT) of the stool. You may have this test every year starting at age 75.  Flexible sigmoidoscopy or colonoscopy. You may have a sigmoidoscopy every 5 years or a colonoscopy every 10 years starting at age 65.  Hepatitis C blood test.  Hepatitis B blood  test.  Sexually transmitted disease (STD) testing.  Diabetes screening. This is done by checking your blood sugar (glucose) after you have not eaten for a while (fasting). You may have this done every 1-3 years.  Bone density scan. This is done to screen for osteoporosis. You may have this done starting at age 75.  Mammogram. This may be done every 1-2 years. Talk to your health care provider about how often you should  have regular mammograms. Talk with your health care provider about your test results, treatment options, and if necessary, the need for more tests. Vaccines  Your health care provider may recommend certain vaccines, such as:  Influenza vaccine. This is recommended every year.  Tetanus, diphtheria, and acellular pertussis (Tdap, Td) vaccine. You may need a Td booster every 10 years.  Zoster vaccine. You may need this after age 60.  Pneumococcal 13-valent conjugate (PCV13) vaccine. One dose is recommended after age 75.  Pneumococcal polysaccharide (PPSV23) vaccine. One dose is recommended after age 75. Talk to your health care provider about which screenings and vaccines you need and how often you need them. This information is not intended to replace advice given to you by your health care provider. Make sure you discuss any questions you have with your health care provider. Document Released: 12/24/2015 Document Revised: 08/16/2016 Document Reviewed: 09/28/2015 Elsevier Interactive Patient Education  2017 [place] Prevention in the Home Falls can cause injuries. They can happen to people of all ages. There are many things you can do to make your home safe and to help prevent falls. What can I do on the outside of my home?  Regularly fix the edges of walkways and driveways and fix any cracks.  Remove anything that might make you trip as you walk through a door, such as a raised step or threshold.  Trim any bushes or trees on the path to your home.  Use bright outdoor lighting.  Clear any walking paths of anything that might make someone trip, such as rocks or tools.  Regularly check to see if handrails are loose or broken. Make sure that both sides of any steps have handrails.  Any raised decks and porches should have guardrails on the edges.  Have any leaves, snow, or ice cleared regularly.  Use sand or salt on walking paths during winter.  Clean up any spills in  your garage right away. This includes oil or grease spills. What can I do in the bathroom?  Use night lights.  Install grab bars by the toilet and in the tub and shower. Do not use towel bars as grab bars.  Use non-skid mats or decals in the tub or shower.  If you need to sit down in the shower, use a plastic, non-slip stool.  Keep the floor dry. Clean up any water that spills on the floor as soon as it happens.  Remove soap buildup in the tub or shower regularly.  Attach bath mats securely with double-sided non-slip rug tape.  Do not have throw rugs and other things on the floor that can make you trip. What can I do in the bedroom?  Use night lights.  Make sure that you have a light by your bed that is easy to reach.  Do not use any sheets or blankets that are too big for your bed. They should not hang down onto the floor.  Have a firm chair that has side arms. You can use this for support while  you get dressed.  Do not have throw rugs and other things on the floor that can make you trip. What can I do in the kitchen?  Clean up any spills right away.  Avoid walking on wet floors.  Keep items that you use a lot in easy-to-reach places.  If you need to reach something above you, use a strong step stool that has a grab bar.  Keep electrical cords out of the way.  Do not use floor polish or wax that makes floors slippery. If you must use wax, use non-skid floor wax.  Do not have throw rugs and other things on the floor that can make you trip. What can I do with my stairs?  Do not leave any items on the stairs.  Make sure that there are handrails on both sides of the stairs and use them. Fix handrails that are broken or loose. Make sure that handrails are as long as the stairways.  Check any carpeting to make sure that it is firmly attached to the stairs. Fix any carpet that is loose or worn.  Avoid having throw rugs at the top or bottom of the stairs. If you do have  throw rugs, attach them to the floor with carpet tape.  Make sure that you have a light switch at the top of the stairs and the bottom of the stairs. If you do not have them, ask someone to add them for you. What else can I do to help prevent falls?  Wear shoes that:  Do not have high heels.  Have rubber bottoms.  Are comfortable and fit you well.  Are closed at the toe. Do not wear sandals.  If you use a stepladder:  Make sure that it is fully opened. Do not climb a closed stepladder.  Make sure that both sides of the stepladder are locked into place.  Ask someone to hold it for you, if possible.  Clearly mark and make sure that you can see:  Any grab bars or handrails.  First and last steps.  Where the edge of each step is.  Use tools that help you move around (mobility aids) if they are needed. These include:  Canes.  Walkers.  Scooters.  Crutches.  Turn on the lights when you go into a dark area. Replace any light bulbs as soon as they burn out.  Set up your furniture so you have a clear path. Avoid moving your furniture around.  If any of your floors are uneven, fix them.  If there are any pets around you, be aware of where they are.  Review your medicines with your doctor. Some medicines can make you feel dizzy. This can increase your chance of falling. Ask your doctor what other things that you can do to help prevent falls. This information is not intended to replace advice given to you by your health care provider. Make sure you discuss any questions you have with your health care provider. Document Released: 09/23/2009 Document Revised: 05/04/2016 Document Reviewed: 01/01/2015 Elsevier Interactive Patient Education  2017 [employer].

## 2020-07-23 ENCOUNTER — Ambulatory Visit: Payer: Medicare Other | Admitting: Podiatry

## 2020-07-27 ENCOUNTER — Ambulatory Visit: Payer: Medicare Other | Admitting: Internal Medicine

## 2020-07-27 ENCOUNTER — Telehealth: Payer: Self-pay | Admitting: Internal Medicine

## 2020-07-27 NOTE — Telephone Encounter (Signed)
Patient no-showed today's appointment; appointment was for 8/17 at 8:30 am, provider notified for review of record. Mychart message sent to re-schedule.

## 2020-08-19 ENCOUNTER — Encounter: Payer: Self-pay | Admitting: Internal Medicine

## 2020-08-24 ENCOUNTER — Telehealth: Payer: Self-pay | Admitting: Internal Medicine

## 2020-08-24 NOTE — Telephone Encounter (Signed)
Patient called in wanted to know if she had a follow up appointment for MRI at [facility]

## 2020-08-27 ENCOUNTER — Ambulatory Visit: Payer: Medicare Other | Admitting: Internal Medicine

## 2020-08-27 NOTE — Telephone Encounter (Signed)
See referrals needs Dr. Tillie Rung and Dr. Rolin Barry appts   Thanks and inform pt  [place]

## 2020-08-27 NOTE — Telephone Encounter (Signed)
I spoke with Evelyn Barker about the referral to Dr Tillie Rung at [facility] she had not heard anything., I called [facility] they're requesting notes again. I re faxed notes I also requested that [facility] call me once notes have been received and informed Evelyn Barker as well.

## 2020-08-30 NOTE — Telephone Encounter (Signed)
Good morning!  Pt stated she had appt with Dr Francella Solian already .

## 2020-08-30 NOTE — Telephone Encounter (Signed)
Also Dr. Francella Solian as well and notify pt Dr. Erby Pian and [place] f/u please  Thanks

## 2020-09-17 ENCOUNTER — Telehealth: Payer: Self-pay | Admitting: Internal Medicine

## 2020-09-17 ENCOUNTER — Ambulatory Visit (INDEPENDENT_AMBULATORY_CARE_PROVIDER_SITE_OTHER): Payer: Medicare Other | Admitting: Internal Medicine

## 2020-09-17 ENCOUNTER — Encounter: Payer: Self-pay | Admitting: Internal Medicine

## 2020-09-17 ENCOUNTER — Other Ambulatory Visit: Payer: Self-pay

## 2020-09-17 VITALS — BP 128/80 | HR 68 | Temp 98.2°F | Ht 64.5 in | Wt 88.2 lb

## 2020-09-17 DIAGNOSIS — K8689 Other specified diseases of pancreas: Secondary | ICD-10-CM | POA: Diagnosis not present

## 2020-09-17 DIAGNOSIS — E559 Vitamin D deficiency, unspecified: Secondary | ICD-10-CM

## 2020-09-17 DIAGNOSIS — E538 Deficiency of other specified B group vitamins: Secondary | ICD-10-CM | POA: Diagnosis not present

## 2020-09-17 DIAGNOSIS — N3000 Acute cystitis without hematuria: Secondary | ICD-10-CM

## 2020-09-17 DIAGNOSIS — D490 Neoplasm of unspecified behavior of digestive system: Secondary | ICD-10-CM

## 2020-09-17 DIAGNOSIS — R634 Abnormal weight loss: Secondary | ICD-10-CM | POA: Diagnosis not present

## 2020-09-17 NOTE — Progress Notes (Signed)
Chief Complaint  Patient presents with   Weight Loss   F/u weight loss  1. Weight loss still losing from mid 90s lbs to 88 though she is eating with pancreas cysts/masses c/w IPMN and referral sent 05/25/20 Dr. Charlies Constable GI but pt still has not heard from this referral and scheduler contacted this office ok the phone today and pt has appt 09/21/20 rec EUS and pancreatitic bx as pet scans negative in the past  Mri abdomen 05/12/20 Premedication/adverse events: None.   FINDINGS: No free air, free fluid or fluid collection.   Lower chest: The visualized lungs are aerated. No pleural or pericardial  effusion.   ABDOMEN:   Liver: The liver enhances homogeneously without suspicious mass.  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   Gallbladder and biliary: Normal gallbladder without cholelithiasis. Normal  caliber bile ducts.   Spleen: Normal spleen.   Pancreas: Cystic lesion in the pancreatic head/neck measures 2.0 cm with  intrinsic enhancing septations, unchanged from prior exam (series 4, image  15). No intrinsic enhancing nodularity. Normal caliber main pancreatic  duct. Additional adjacent subcentimeter lesion is unchanged.   Adrenal glands: Normal adrenal glands.   Kidneys and ureters: Normal kidneys and ureters.   GI tract: The stomach is decompressed and poorly evaluated. Normal caliber  small bowel and colon.   Vascular structures: Normal caliber abdominal aorta. The celiac axis, SMA,  bilateral renal arteries, and IMA are patent. Portal and mesenteric venous  structures are patent.   Lymph nodes: No abdominal lymphadenopathy.   Bones: Unremarkable.   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 Normal caliber main pancreatic duct. Continued  attention on  follow-up imaging is recommended.   Electronically Reviewed by: Mercie Eon, DO, [facility]  Electronically Reviewed on: 05/13/2020 8:58 AM   I have reviewed the images and concur with the above findings.   Electronically Signed by: Domingo Mend, MD, [facility]  Electronically Signed on: 05/13/2020 4:39 PM Specimen Collected: 05/12/20 2:40 PM    Review of Systems  Constitutional: Positive for weight loss. Negative for malaise/fatigue.  HENT: Negative for hearing loss.   Respiratory: Negative for shortness of breath.   Cardiovascular: Negative for chest pain.  Gastrointestinal: Positive for abdominal pain.       +LUQ ab pain rad to left back pain ab behind rib cage    Skin: Negative for rash.   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 +   Past Surgical History:  Procedure Laterality Date   ABDOMINAL HYSTERECTOMY     DUB, endometriosis no h/o abnormal pap    APPENDECTOMY     COLONOSCOPY N/A 04/30/2015   Procedure: COLONOSCOPY;  Surgeon: Josefine Class, MD;  Location: [facility] ENDOSCOPY;  Service: Endoscopy;  Laterality: N/A;   TONSILLECTOMY     Family History  Problem Relation Age of Onset   Stroke Mother    Cancer Father        prostate    Pneumonia Maternal  Grandmother    Hip fracture Maternal Grandmother    Stroke Paternal Grandfather    Skin cancer Other    Breast cancer Neg Hx    Social History   Socioeconomic History   Marital status: Widowed    Spouse name: Not on file   Number of children: Not on file   Years of education: Not on file   Highest education level: Not on file  Occupational History   Not on file  Tobacco Use   Smoking status: Never Smoker    Smokeless tobacco: Never Used  Substance and Sexual Activity   Alcohol use: Not Currently   Drug use: Not Currently   Sexual activity: Not Currently    Partners: Male  Other Topics Concern   Not on file  Social History Narrative   Widowed    Grew up in [place] moved around when husband living    No kids    Lives [place]    Never smoker    Likes to pain for [place]    Enjoys plays    No guns    Wears seat belt, safe in relationship   Retired Teacher, 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    Social Determinants of Radio broadcast assistant Strain: Low Risk    Difficulty of Paying Living Expenses: Not hard at all  Food Insecurity: No Food Insecurity   Worried About Charity fundraiser in the Last Year: Never true   Arboriculturist in the Last Year: Never true  Transportation Needs: No Transportation Needs   Lack of Transportation (Medical): No   Lack of Transportation (Non-Medical): No  Physical Activity: Unknown   Days of Exercise per Week: 0 days   Minutes of Exercise per Session: Not on file  Stress: No Stress Concern Present   Feeling of Stress : Not at all  Social Connections: Unknown   Frequency of Communication with Friends and Family: More than three times a week   Frequency of Social Gatherings with Friends and Family: More than three times a week   Attends Religious Services: Not on file   Active Member of Clubs or Organizations: Not on file   Attends Archivist Meetings: Not on file   Marital Status: Widowed  Human resources officer Violence: Not At Risk   Fear of Current or Ex-Partner: No   Emotionally Abused: No   Physically Abused: No   Sexually Abused: No   Current Meds  Medication Sig   ALPRAZolam (XANAX) 0.25 MG tablet Take 1 tablet (0.25 mg total) by mouth at bedtime as needed for anxiety.   docusate sodium (COLACE) 100 MG capsule Take 1 capsule (100 mg total) by mouth daily as needed for mild  constipation.   famotidine (PEPCID) 20 MG tablet Take 1 tablet (20 mg total) by mouth at bedtime.   gentamicin cream (GARAMYCIN) 0.1 % Apply 1 application topically 2 (two) times daily.   ibuprofen (ADVIL,MOTRIN) 200 MG tablet Take 200 mg by mouth 2 (two) times daily as needed for moderate pain.    multivitamin-lutein (OCUVITE-LUTEIN) CAPS capsule Take 1 capsule by mouth every other day.   omeprazole (PRILOSEC) 40 MG capsule Take 40 mg by mouth daily.    polyethylene glycol (MIRALAX / GLYCOLAX) 17 g packet Take 17 g by mouth daily as needed.   triamcinolone cream (KENALOG) 0.1 % Apply 1 application topically 2 (two) times daily. Right upper back as needed  Allergies  Allergen Reactions   Amoxil [Amoxicillin]    Tylenol [Acetaminophen]     ?reaction     Ampicillin Rash   Keflex [Cephalexin] Rash   Macrobid [Nitrofurantoin] Rash   Ofloxacin Rash   Penicillins Rash   Sulfur Rash   Recent Results (from the past 2160 hour(s))  Comprehensive metabolic panel     Status: Abnormal   Collection Time: 09/17/20  3:31 PM  Result Value Ref Range   Glucose, Bld 96 65 - 99 mg/dL    Comment: .            Fasting reference interval .    BUN 17 7 - 25 mg/dL   Creat 1.28 (H) 0.60 - 0.93 mg/dL    Comment: For patients >89 years of age, the reference limit for Creatinine is approximately 13% higher for people identified as African-American. .    BUN/Creatinine Ratio 13 6 - 22 (calc)   Sodium 138 135 - 146 mmol/L   Potassium 4.3 3.5 - 5.3 mmol/L   Chloride 103 98 - 110 mmol/L   CO2 29 20 - 32 mmol/L   Calcium 9.2 8.6 - 10.4 mg/dL   Total Protein 5.8 (L) 6.1 - 8.1 g/dL   Albumin 4.2 3.6 - 5.1 g/dL   Globulin 1.6 (L) 1.9 - 3.7 g/dL (calc)   AG Ratio 2.6 (H) 1.0 - 2.5 (calc)   Total Bilirubin 1.7 (H) 0.2 - 1.2 mg/dL   Alkaline phosphatase (APISO) 47 37 - 153 U/L   AST 20 10 - 35 U/L   ALT 22 6 - 29 U/L  CBC with Differential/Platelet     Status: Abnormal   Collection Time:  09/17/20  3:31 PM  Result Value Ref Range   WBC 5.5 3.8 - 10.8 Thousand/uL   RBC 4.10 3.80 - 5.10 Million/uL   Hemoglobin 13.7 11.7 - 15.5 g/dL   HCT 40.1 35 - 45 %   MCV 97.8 80.0 - 100.0 fL   MCH 33.4 (H) 27.0 - 33.0 pg   MCHC 34.2 32.0 - 36.0 g/dL   RDW 12.2 11.0 - 15.0 %   Platelets 237 140 - 400 Thousand/uL   MPV 10.3 7.5 - 12.5 fL   Neutro Abs 3,916 1,500 - 7,800 cells/uL   Lymphs Abs 1,106 850 - 3,900 cells/uL   Absolute Monocytes 435 200 - 950 cells/uL   Eosinophils Absolute 22 15 - 500 cells/uL   Basophils Absolute 22 0 - 200 cells/uL   Neutrophils Relative % 71.2 %   Total Lymphocyte 20.1 %   Monocytes Relative 7.9 %   Eosinophils Relative 0.4 %   Basophils Relative 0.4 %  TSH     Status: None   Collection Time: 09/17/20  3:31 PM  Result Value Ref Range   TSH 1.79 0.40 - 4.50 mIU/L  Vitamin D (25 hydroxy)     Status: None   Collection Time: 09/17/20  3:31 PM  Result Value Ref Range   Vit D, 25-Hydroxy 32 30 - 100 ng/mL    Comment: Vitamin D Status         25-OH Vitamin D: . Deficiency:                    <20 ng/mL Insufficiency:             20 - 29 ng/mL Optimal:                 > or = 30 ng/mL . For  25-OH Vitamin D testing on patients on  D2-supplementation and patients for whom quantitation  of D2 and D3 fractions is required, the [facility](TM) 25-OH VIT D, (D2,D3), LC/MS/MS is recommended: order  code [id number] (patients >52yrs). See Note 1 . Note 1 . For additional information, please refer to  http://education.[facility].com/faq/FAQ199  (This link is being provided for informational/ educational purposes only.)   B12     Status: None   Collection Time: 09/17/20  3:31 PM  Result Value Ref Range   Vitamin B-12 498 200 - 1,100 pg/mL  Celiac Disease Ab Screen w/Rfx     Status: None (Preliminary result)   Collection Time: 09/17/20  3:31 PM  Result Value Ref Range   Antigliadin Abs, IgA WILL FOLLOW    Transglutaminase IgA <2 0 - 3 U/mL     Comment:                               Negative        0 -  3                               Weak Positive   4 - 10                               Positive           >10  Tissue Transglutaminase (tTG) has been identified  as the endomysial antigen.  Studies have demonstr-  ated that endomysial IgA antibodies have over 99%  specificity for gluten sensitive enteropathy.    IgA/Immunoglobulin A, Serum 93 64 - 422 mg/dL  Urinalysis, Routine w reflex microscopic     Status: None   Collection Time: 09/17/20  3:31 PM  Result Value Ref Range   Color, Urine YELLOW YELLOW   APPearance CLEAR CLEAR   Specific Gravity, Urine 1.016 1.001 - 1.03   pH 5.5 5.0 - 8.0   Glucose, UA NEGATIVE NEGATIVE   Bilirubin Urine NEGATIVE NEGATIVE   Ketones, ur NEGATIVE NEGATIVE   Hgb urine dipstick NEGATIVE NEGATIVE   Protein, ur NEGATIVE NEGATIVE   Nitrite NEGATIVE NEGATIVE   Leukocytes,Ua NEGATIVE NEGATIVE   WBC, UA NONE SEEN 0 - 5 /HPF   RBC / HPF NONE SEEN 0 - 2 /HPF   Squamous Epithelial / LPF NONE SEEN < OR = 5 /HPF   Bacteria, UA NONE SEEN NONE SEEN /HPF   Hyaline Cast NONE SEEN NONE SEEN /LPF  Urine Culture     Status: None   Collection Time: 09/17/20  3:31 PM   Specimen: Urine  Result Value Ref Range   MICRO NUMBER: [id number]    SPECIMEN QUALITY: Adequate    Sample Source URINE    STATUS: FINAL    Result: No Growth    Objective  Body mass index is 14.91 kg/m. Wt Readings from Last 3 Encounters:  09/17/20 88 lb 3.2 oz (40 kg)  07/22/20 92 lb (41.7 kg)  05/25/20 92 lb 12.8 oz (42.1 kg)   Temp Readings from Last 3 Encounters:  09/17/20 98.2 F (36.8 C) (Oral)  05/25/20 (!) 97.5 F (36.4 C) (Temporal)  03/03/20 97.8 F (36.6 C) (Temporal)   BP Readings from Last 3 Encounters:  09/17/20 128/80  05/25/20 120/72  03/03/20 116/74   Pulse Readings from Last 3 Encounters:  09/17/20 68  05/25/20 75  03/03/20 80    Physical Exam Vitals and nursing note reviewed.  Constitutional:       Appearance: Normal appearance. She is well-developed and well-groomed. She is cachectic.  HENT:     Head: Normocephalic and atraumatic.  Eyes:     Conjunctiva/sclera: Conjunctivae normal.     Pupils: Pupils are equal, round, and reactive to light.  Cardiovascular:     Rate and Rhythm: Normal rate and regular rhythm.     Heart sounds: Normal heart sounds. No murmur heard.   Pulmonary:     Effort: Pulmonary effort is normal.     Breath sounds: Normal breath sounds.  Abdominal:     General: Abdomen is flat. Bowel sounds are normal.     Tenderness: There is no abdominal tenderness.  Skin:    General: Skin is warm and dry.  Neurological:     General: No focal deficit present.     Mental Status: She is alert and oriented to person, place, and time. Mental status is at baseline.     Gait: Gait normal.  Psychiatric:        Attention and Perception: Attention and perception normal.        Mood and Affect: Mood and affect normal.        Speech: Speech normal.        Behavior: Behavior normal. Behavior is cooperative.        Thought Content: Thought content normal.        Cognition and Memory: Cognition and memory normal.        Judgment: Judgment normal.     Assessment  Plan  Mass of pancreas c/w IPMN Weight loss - Plan: Comprehensive metabolic panel, CBC with Differential/Platelet, TSH, Celiac Disease Ab Screen w/Rfx, Urinalysis, Routine w reflex microscopic, Urine Culture -called to sch appt with Dr. Mont Dutton after referral placed and sent 05/25/20 for EUS and pancreatitic bx and pt never contacted by [facility]  Pt sch 09/21/20  HM Flu shotutd pna 23 had10/1/16 prevnardiscprev.pt will think about it Given Rx shingrixprevRx 2/2 given Tdap had 10/11/12  zostervax had Hep C neg 01/07/19 Hep B not immune, hep A immune 01/07/19 covid vx had 2/85moderna   Never smoker  No need for pap s/p hysterectomy DUB, endometriosis  Mammogram10/8/20ordered 09/17/20 A1C 5.6 dexa had  11/15/2017 +osteoporosis -2.9 left femur will repeatordered -Pt never took fosamax disc proliaprev.declines 2/2 c/w side effects  Colonoscopy h/o polyp tubular 05/03/15 Had repeat EGD/colonoscopy 06/17/2019 [facility]. 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 Olivia Mackie McLean-Scocuzza-Internal Medicine

## 2020-09-17 NOTE — Telephone Encounter (Signed)
Pt came in to the office for a appt with more weight loss. Dr Aundra Dubin wanted pt to be seen Urgently at [facility] gastro. I called [facility] to get pt scheduled. The rep that answered the phone was stating that she had to figure out why pt was not scheduled before she was able to get pt scheduled. Dr Aundra Dubin was admittedly needing the pt to be scheduled. I requested to speak to supervisor. Laron Blount came on the line I explained to him that the pt referral was received on 05/27/2020 and pt had not been scheduled and she is progressively getting worse and that pt needed to be scheduled immediately he stated that he has to get in touch with the advanced procedure team and speak to the supervisor doctor to find out where to schedule pt. I ask if pt can be called once a appt has been located and call our office here to notify. Dr Aundra Dubin was informed as well.

## 2020-09-17 NOTE — Patient Instructions (Addendum)
Dr. Tillie Rung [facility] [phone]  [address]  [place], [place] [address]

## 2020-09-18 LAB — COMPREHENSIVE METABOLIC PANEL
AG Ratio: 2.6 (calc) — ABNORMAL HIGH (ref 1.0–2.5)
ALT: 22 U/L (ref 6–29)
AST: 20 U/L (ref 10–35)
Albumin: 4.2 g/dL (ref 3.6–5.1)
Alkaline phosphatase (APISO): 47 U/L (ref 37–153)
BUN/Creatinine Ratio: 13 (calc) (ref 6–22)
BUN: 17 mg/dL (ref 7–25)
CO2: 29 mmol/L (ref 20–32)
Calcium: 9.2 mg/dL (ref 8.6–10.4)
Chloride: 103 mmol/L (ref 98–110)
Creat: 1.28 mg/dL — ABNORMAL HIGH (ref 0.60–0.93)
Globulin: 1.6 g/dL (calc) — ABNORMAL LOW (ref 1.9–3.7)
Glucose, Bld: 96 mg/dL (ref 65–99)
Potassium: 4.3 mmol/L (ref 3.5–5.3)
Sodium: 138 mmol/L (ref 135–146)
Total Bilirubin: 1.7 mg/dL — ABNORMAL HIGH (ref 0.2–1.2)
Total Protein: 5.8 g/dL — ABNORMAL LOW (ref 6.1–8.1)

## 2020-09-18 LAB — CBC WITH DIFFERENTIAL/PLATELET
Absolute Monocytes: 435 cells/uL (ref 200–950)
Basophils Absolute: 22 cells/uL (ref 0–200)
Basophils Relative: 0.4 %
Eosinophils Absolute: 22 cells/uL (ref 15–500)
Eosinophils Relative: 0.4 %
HCT: 40.1 % (ref 35.0–45.0)
Hemoglobin: 13.7 g/dL (ref 11.7–15.5)
Lymphs Abs: 1106 cells/uL (ref 850–3900)
MCH: 33.4 pg — ABNORMAL HIGH (ref 27.0–33.0)
MCHC: 34.2 g/dL (ref 32.0–36.0)
MCV: 97.8 fL (ref 80.0–100.0)
MPV: 10.3 fL (ref 7.5–12.5)
Monocytes Relative: 7.9 %
Neutro Abs: 3916 cells/uL (ref 1500–7800)
Neutrophils Relative %: 71.2 %
Platelets: 237 10*3/uL (ref 140–400)
RBC: 4.1 10*6/uL (ref 3.80–5.10)
RDW: 12.2 % (ref 11.0–15.0)
Total Lymphocyte: 20.1 %
WBC: 5.5 10*3/uL (ref 3.8–10.8)

## 2020-09-18 LAB — TSH: TSH: 1.79 mIU/L (ref 0.40–4.50)

## 2020-09-18 LAB — VITAMIN D 25 HYDROXY (VIT D DEFICIENCY, FRACTURES): Vit D, 25-Hydroxy: 32 ng/mL (ref 30–100)

## 2020-09-18 LAB — VITAMIN B12: Vitamin B-12: 498 pg/mL (ref 200–1100)

## 2020-09-19 ENCOUNTER — Other Ambulatory Visit: Payer: Self-pay | Admitting: Internal Medicine

## 2020-09-19 DIAGNOSIS — N179 Acute kidney failure, unspecified: Secondary | ICD-10-CM

## 2020-09-19 LAB — URINALYSIS, ROUTINE W REFLEX MICROSCOPIC
Bacteria, UA: NONE SEEN /HPF
Bilirubin Urine: NEGATIVE
Glucose, UA: NEGATIVE
Hgb urine dipstick: NEGATIVE
Hyaline Cast: NONE SEEN /LPF
Ketones, ur: NEGATIVE
Leukocytes,Ua: NEGATIVE
Nitrite: NEGATIVE
Protein, ur: NEGATIVE
RBC / HPF: NONE SEEN /HPF (ref 0–2)
Specific Gravity, Urine: 1.016 (ref 1.001–1.03)
Squamous Epithelial / HPF: NONE SEEN /HPF (ref <=5)
WBC, UA: NONE SEEN /HPF (ref 0–5)
pH: 5.5 (ref 5.0–8.0)

## 2020-09-19 LAB — URINE CULTURE
MICRO NUMBER:: 11049822
Result:: NO GROWTH
SPECIMEN QUALITY:: ADEQUATE

## 2020-09-19 NOTE — Telephone Encounter (Signed)
Please f/u with referral 09/20/20 and let me and the patient know about appt date and time please with Dr. Tillie Rung [facility] Thanks

## 2020-09-20 DIAGNOSIS — D490 Neoplasm of unspecified behavior of digestive system: Secondary | ICD-10-CM | POA: Insufficient documentation

## 2020-09-20 NOTE — Telephone Encounter (Signed)
Received other note and sent information.

## 2020-09-21 DIAGNOSIS — Z882 Allergy status to sulfonamides status: Secondary | ICD-10-CM | POA: Diagnosis not present

## 2020-09-21 DIAGNOSIS — K862 Cyst of pancreas: Secondary | ICD-10-CM | POA: Diagnosis not present

## 2020-09-21 DIAGNOSIS — G8929 Other chronic pain: Secondary | ICD-10-CM | POA: Diagnosis not present

## 2020-09-21 DIAGNOSIS — Z881 Allergy status to other antibiotic agents status: Secondary | ICD-10-CM | POA: Diagnosis not present

## 2020-09-21 DIAGNOSIS — Z880 Allergy status to penicillin: Secondary | ICD-10-CM | POA: Diagnosis not present

## 2020-09-21 DIAGNOSIS — K8689 Other specified diseases of pancreas: Secondary | ICD-10-CM | POA: Diagnosis not present

## 2020-09-21 DIAGNOSIS — M1611 Unilateral primary osteoarthritis, right hip: Secondary | ICD-10-CM | POA: Diagnosis not present

## 2020-09-21 DIAGNOSIS — R634 Abnormal weight loss: Secondary | ICD-10-CM | POA: Diagnosis not present

## 2020-09-21 DIAGNOSIS — K2100 Gastro-esophageal reflux disease with esophagitis, without bleeding: Secondary | ICD-10-CM | POA: Diagnosis not present

## 2020-09-21 DIAGNOSIS — Z9049 Acquired absence of other specified parts of digestive tract: Secondary | ICD-10-CM | POA: Diagnosis not present

## 2020-09-21 DIAGNOSIS — Z79899 Other long term (current) drug therapy: Secondary | ICD-10-CM | POA: Diagnosis not present

## 2020-09-21 DIAGNOSIS — Z8719 Personal history of other diseases of the digestive system: Secondary | ICD-10-CM | POA: Diagnosis not present

## 2020-09-22 LAB — CELIAC DISEASE AB SCREEN W/RFX
Antigliadin Abs, IgA: 2 units (ref 0–19)
IgA/Immunoglobulin A, Serum: 93 mg/dL (ref 64–422)
Transglutaminase IgA: <2 U/mL (ref 0–3)

## 2020-09-27 ENCOUNTER — Other Ambulatory Visit: Payer: Self-pay | Admitting: Internal Medicine

## 2020-09-27 NOTE — Addendum Note (Signed)
Addended by: Orland Mustard on: 09/27/2020 09:13 AM   Modules accepted: Orders

## 2020-09-28 DIAGNOSIS — Z23 Encounter for immunization: Secondary | ICD-10-CM | POA: Diagnosis not present

## 2020-10-04 ENCOUNTER — Other Ambulatory Visit: Payer: Self-pay

## 2020-10-04 ENCOUNTER — Inpatient Hospital Stay: Payer: Medicare Other | Attending: Oncology | Admitting: Oncology

## 2020-10-04 ENCOUNTER — Encounter: Payer: Self-pay | Admitting: Oncology

## 2020-10-04 ENCOUNTER — Inpatient Hospital Stay: Payer: Medicare Other

## 2020-10-04 VITALS — BP 132/73 | HR 65 | Temp 98.0°F | Resp 20 | Wt 86.2 lb

## 2020-10-04 DIAGNOSIS — R0782 Intercostal pain: Secondary | ICD-10-CM | POA: Diagnosis not present

## 2020-10-04 DIAGNOSIS — F419 Anxiety disorder, unspecified: Secondary | ICD-10-CM | POA: Insufficient documentation

## 2020-10-04 DIAGNOSIS — K5900 Constipation, unspecified: Secondary | ICD-10-CM | POA: Diagnosis not present

## 2020-10-04 DIAGNOSIS — K862 Cyst of pancreas: Secondary | ICD-10-CM | POA: Insufficient documentation

## 2020-10-04 DIAGNOSIS — K760 Fatty (change of) liver, not elsewhere classified: Secondary | ICD-10-CM | POA: Diagnosis not present

## 2020-10-04 DIAGNOSIS — R634 Abnormal weight loss: Secondary | ICD-10-CM | POA: Diagnosis not present

## 2020-10-04 DIAGNOSIS — R0789 Other chest pain: Secondary | ICD-10-CM

## 2020-10-04 DIAGNOSIS — M810 Age-related osteoporosis without current pathological fracture: Secondary | ICD-10-CM | POA: Diagnosis not present

## 2020-10-04 DIAGNOSIS — K219 Gastro-esophageal reflux disease without esophagitis: Secondary | ICD-10-CM | POA: Insufficient documentation

## 2020-10-04 LAB — CBC WITH DIFFERENTIAL/PLATELET
Abs Immature Granulocytes: 0.01 10*3/uL (ref 0.00–0.07)
Basophils Absolute: 0 10*3/uL (ref 0.0–0.1)
Basophils Relative: 0 %
Eosinophils Absolute: 0 10*3/uL (ref 0.0–0.5)
Eosinophils Relative: 0 %
HCT: 41.7 % (ref 36.0–46.0)
Hemoglobin: 14.6 g/dL (ref 12.0–15.0)
Immature Granulocytes: 0 %
Lymphocytes Relative: 28 %
Lymphs Abs: 1.4 10*3/uL (ref 0.7–4.0)
MCH: 33.3 pg (ref 26.0–34.0)
MCHC: 35 g/dL (ref 30.0–36.0)
MCV: 95.2 fL (ref 80.0–100.0)
Monocytes Absolute: 0.4 10*3/uL (ref 0.1–1.0)
Monocytes Relative: 7 %
Neutro Abs: 3.2 10*3/uL (ref 1.7–7.7)
Neutrophils Relative %: 65 %
Platelets: 235 10*3/uL (ref 150–400)
RBC: 4.38 MIL/uL (ref 3.87–5.11)
RDW: 12.1 % (ref 11.5–15.5)
WBC: 5 10*3/uL (ref 4.0–10.5)
nRBC: 0 % (ref 0.0–0.2)

## 2020-10-04 LAB — COMPREHENSIVE METABOLIC PANEL
ALT: 27 U/L (ref 0–44)
AST: 24 U/L (ref 15–41)
Albumin: 4.4 g/dL (ref 3.5–5.0)
Alkaline Phosphatase: 53 U/L (ref 38–126)
Anion gap: 7 (ref 5–15)
BUN: 19 mg/dL (ref 8–23)
CO2: 28 mmol/L (ref 22–32)
Calcium: 8.7 mg/dL — ABNORMAL LOW (ref 8.9–10.3)
Chloride: 100 mmol/L (ref 98–111)
Creatinine, Ser: 0.85 mg/dL (ref 0.44–1.00)
GFR, Estimated: >60 mL/min (ref >60)
Glucose, Bld: 95 mg/dL (ref 70–99)
Potassium: 4.5 mmol/L (ref 3.5–5.1)
Sodium: 135 mmol/L (ref 135–145)
Total Bilirubin: 2.4 mg/dL — ABNORMAL HIGH (ref 0.3–1.2)
Total Protein: 6.3 g/dL — ABNORMAL LOW (ref 6.5–8.1)

## 2020-10-04 LAB — IRON AND TIBC
Iron: 84 ug/dL (ref 28–170)
Saturation Ratios: 25 % (ref 10.4–31.8)
TIBC: 335 ug/dL (ref 250–450)
UIBC: 251 ug/dL

## 2020-10-04 LAB — HIV ANTIBODY (ROUTINE TESTING W REFLEX): HIV Screen 4th Generation wRfx: NONREACTIVE

## 2020-10-04 LAB — SEDIMENTATION RATE: Sed Rate: 2 mm/hr (ref 0–30)

## 2020-10-04 LAB — LACTATE DEHYDROGENASE: LDH: 166 U/L (ref 98–192)

## 2020-10-04 LAB — FERRITIN: Ferritin: 140 ng/mL (ref 11–307)

## 2020-10-04 LAB — HEPATITIS C ANTIBODY: HCV Ab: NONREACTIVE

## 2020-10-04 NOTE — Progress Notes (Signed)
Patient states she is very dizzy. One day last week she felt like she was on a moving ship and while walking she needed to hold on to something near. Patient also states gets dizzy while sitting still. Patient also states that she is having pain under ribs on the left side. Patient states on Sunday the pain under rib on left was very painful (9).Patient states that the pai moves up left breast.

## 2020-10-06 ENCOUNTER — Encounter: Payer: Self-pay | Admitting: Oncology

## 2020-10-06 NOTE — Progress Notes (Signed)
Hematology/Oncology Consult note [facility] [phone] [phone]  Patient Care Team: McLean-Scocuzza, Nino Glow, MD as PCP - General (Internal Medicine)   Name of the patient: Evelyn Barker  [id number]  07-02-1946    Reason for referral-unintentional weight loss   Referring physician-Dr. Mclean-Scocuzza  Date of visit: 10/06/20   History of presenting illness-patient is a 74 year old female with a past medical history significant for pancreatic cyst who has been referred to me for unintentional weight loss.Patient was noted to have a pancreatic mass back in May 2020 which was seen on CT abdomen pelvis.  This was followed by a PET scan last year which did not show any significant metabolic activity in the pancreas and a repeat which did not show any significant metabolic activity in the pancreas and a repeat MRI was suggested in 6 months. Repeat MRI in November 2020 showed a 1.9 x 1.3 x 1.1 cm cystic mass in the pancreatic head. Patient was referred to Dr. Mont Dutton from [facility] and underwent EUS on 09/21/2020. Patient states that no biopsies were taken. Prior to that she also had another MRI in June 2021 which showed unchanged size of the cystic lesion.  Patient has been referred to me for ongoing weight loss.Patient has lost 6 pounds in the last 2 months and 10 pounds over the last 1 year. She has been a lifetime non-smoker. Reports that her appetite is fair and eats the best she can but has not been keeping a calorie count. She lives at assisted living. Denies any recurrent infections or hospitalizations. Patient currently reports pain along her right chest wall below her breasts. Denies any cough or shortness of breath.  ECOG PS- 1  Pain scale- 0   Review of systems- Review of Systems  Constitutional: Positive for weight loss. Negative for chills, fever and malaise/fatigue.  HENT: Negative for congestion, ear discharge and nosebleeds.   Eyes:  Negative for blurred vision.  Respiratory: Negative for cough, hemoptysis, sputum production, shortness of breath and wheezing.   Cardiovascular: Negative for chest pain, palpitations, orthopnea and claudication.  Gastrointestinal: Negative for abdominal pain, blood in stool, constipation, diarrhea, heartburn, melena, nausea and vomiting.  Genitourinary: Negative for dysuria, flank pain, frequency, hematuria and urgency.  Musculoskeletal: Negative for back pain, joint pain and myalgias.  Skin: Negative for rash.  Neurological: Negative for dizziness, tingling, focal weakness, seizures, weakness and headaches.  Endo/Heme/Allergies: Does not bruise/bleed easily.  Psychiatric/Behavioral: Negative for depression and suicidal ideas. The patient does not have insomnia.     Allergies  Allergen Reactions  . Amoxil [Amoxicillin]   . Tylenol [Acetaminophen]     ?reaction    . Ampicillin Rash  . Keflex [Cephalexin] Rash  . Macrobid [Nitrofurantoin] Rash  . Ofloxacin Rash  . Penicillins Rash  . Sulfur Rash    Patient Active Problem List   Diagnosis Date Noted  . 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 Pancreatic mass 05/25/2020  . Constipation 11/21/2019  . Gastroesophageal reflux disease 07/28/2019  . Pancreatic cyst 05/23/2019  . Fatty liver 12/27/2018  . Rosacea 11/19/2018  . Osteoporosis 10/16/2018  . Knee pain 10/16/2018  . Right hip pain 10/16/2018  . Weight loss 10/16/2018     Past Medical History:  Diagnosis Date  . Anxiety    knees, low back   . Arthritis   . History of chicken pox   . History of macular degeneration   . History of shingles    x3   .  Osteoporosis   . Osteoporosis    declines medication   . 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 ABDOMINAL HYSTERECTOMY     DUB, endometriosis no h/o abnormal pap   . APPENDECTOMY    . COLONOSCOPY N/A 04/30/2015    Procedure: COLONOSCOPY;  Surgeon: Josefine Class, MD;  Location: [facility] ENDOSCOPY;  Service: Endoscopy;  Laterality: N/A;  . TONSILLECTOMY      Social History   Socioeconomic History  . Marital status: Widowed    Spouse name: Not on file  . Number of children: Not on file  . Years of education: Not on file  . Highest education level: Not on file  Occupational History  . Not on file  Tobacco Use  . Smoking status: Never Smoker  . Smokeless tobacco: Never Used  Vaping Use  . Vaping Use: Never used  Substance and Sexual Activity  . Alcohol use: Not Currently  . Drug use: Not Currently  . Sexual activity: Not Currently    Partners: Male  Other Topics Concern  . 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 Difficulty of Paying Living Expenses: Not hard at all  Food Insecurity: No Food Insecurity  . Worried About Charity fundraiser in the Last Year: Never true  . Ran Out of Food in the Last Year: Never true  Transportation Needs: No Transportation Needs  . Lack of Transportation (Medical): No  . Lack of Transportation (Non-Medical): No  Physical Activity: Unknown  . Days of Exercise per Week: 0 days  . Minutes of Exercise per Session: Not on file  Stress: No Stress Concern Present  . Feeling of Stress : Not at all  Social Connections: Unknown  . Frequency of Communication with Friends and Family: More than three times a week  . Frequency of Social Gatherings with Friends and Family: More than three times a week  . Attends Religious Services: Not on file  . Active Member of Clubs or Organizations: Not on file  .  Attends Archivist Meetings: Not on file  . Marital Status: Widowed  Intimate Partner Violence: Not At Risk  . Fear of Current or Ex-Partner: No  . Emotionally Abused: No  . Physically Abused: No  . Sexually Abused: No     Family History  Problem Relation Age of Onset  . Stroke Mother   . Cancer Father        prostate   . Pneumonia Maternal Grandmother   . Hip fracture Maternal Grandmother   . Stroke Paternal Grandfather   . Skin cancer Other   . Breast cancer Neg Hx      Current Outpatient Medications:  .  ALPRAZolam (XANAX) 0.25 MG tablet, Take 1 tablet (0.25 mg total) by mouth at bedtime as needed for anxiety., Disp: 30 tablet, Rfl: 2 .  docusate sodium (COLACE) 100 MG capsule, Take  1 capsule (100 mg total) by mouth daily as needed for mild constipation., Disp: 60 capsule, Rfl: 11 .  famotidine (PEPCID) 20 MG tablet, Take 1 tablet (20 mg total) by mouth at bedtime., Disp: 90 tablet, Rfl: 3 .  gentamicin cream (GARAMYCIN) 0.1 %, Apply 1 application topically 2 (two) times daily., Disp: 15 g, Rfl: 1 .  ibuprofen (ADVIL,MOTRIN) 200 MG tablet, Take 200 mg by mouth 2 (two) times daily as needed for moderate pain. , Disp: , Rfl:  .  multivitamin-lutein (OCUVITE-LUTEIN) CAPS capsule, Take 1 capsule by mouth every other day., Disp: , Rfl:  .  omeprazole (PRILOSEC) 40 MG capsule, Take 40 mg by mouth daily. , Disp: , Rfl:  .  polyethylene glycol (MIRALAX / GLYCOLAX) 17 g packet, Take 17 g by mouth daily as needed., Disp: 30 each, Rfl: 11 .  triamcinolone cream (KENALOG) 0.1 %, Apply 1 application topically 2 (two) times daily. Right upper back as needed, Disp: 80 g, Rfl: 5   Physical exam:  Vitals:   10/04/20 1336  BP: 132/73  Pulse: 65  Resp: 20  Temp: 98 F (36.7 C)  SpO2: 99%  Weight: 86 lb 3.2 oz (39.1 kg)   Physical Exam Constitutional:      Comments: Thin elderly cachectic female in no acute distress  Cardiovascular:     Rate and Rhythm: Normal rate and  regular rhythm.     Heart sounds: Normal heart sounds.  Pulmonary:     Effort: Pulmonary effort is normal.     Breath sounds: Normal breath sounds.  Abdominal:     General: Bowel sounds are normal.     Palpations: Abdomen is soft.     Comments: No palpable hepatosplenomegaly  Musculoskeletal:     Cervical back: Normal range of motion.  Lymphadenopathy:     Comments: No palpable cervical, supraclavicular, axillary or inguinal adenopathy   Skin:    General: Skin is warm and dry.  Neurological:     Mental Status: She is alert and oriented to person, place, and time.        CMP Latest Ref Rng & Units 10/04/2020  Glucose 70 - 99 mg/dL 95  BUN 8 - 23 mg/dL 19  Creatinine 0.44 - 1.00 mg/dL 0.85  Sodium 135 - 145 mmol/L 135  Potassium 3.5 - 5.1 mmol/L 4.5  Chloride 98 - 111 mmol/L 100  CO2 22 - 32 mmol/L 28  Calcium 8.9 - 10.3 mg/dL 8.7(L)  Total Protein 6.5 - 8.1 g/dL 6.3(L)  Total Bilirubin 0.3 - 1.2 mg/dL 2.4(H)  Alkaline Phos 38 - 126 U/L 53  AST 15 - 41 U/L 24  ALT 0 - 44 U/L 27   CBC Latest Ref Rng & Units 10/04/2020  WBC 4.0 - 10.5 K/uL 5.0  Hemoglobin 12.0 - 15.0 g/dL 14.6  Hematocrit 36 - 46 % 41.7  Platelets 150 - 400 K/uL 235    Assessment and plan- Patient is a 74 y.o. female referred for abnormal weight loss  Patient had a recent CT abdomen in June 2021 which did not show any evidence of malignancy. She is also on a recent EUS by Dr. Mont Dutton at [facility] and I do not have those results at this time.Her recent CBC from 09/17/2020 was essentially unremarkable and do not see any reason to do a bone marrow biopsy at this time. I will check a CBC with differential, CMP, ferritin and iron studies, HIV hepatitis C ESR and LDH. I will also get a  CT chest without contrast given her ongoing right-sided rib pain. I will see her after the CT scan and blood work results are back  Thank you for this kind referral and the opportunity to participate in the care of this  patient   Visit Diagnosis 1. Abnormal weight loss   2. Chest wall pain   3. Intercostal pain     Dr. Randa Evens, MD, MPH [facility] [phone] 10/06/2020

## 2020-10-13 ENCOUNTER — Ambulatory Visit: Payer: Medicare Other | Attending: Oncology

## 2020-10-14 ENCOUNTER — Inpatient Hospital Stay: Payer: Medicare Other | Attending: Oncology | Admitting: Oncology

## 2020-10-14 ENCOUNTER — Encounter: Payer: Self-pay | Admitting: Oncology

## 2020-10-14 ENCOUNTER — Other Ambulatory Visit: Payer: Self-pay

## 2020-10-14 VITALS — BP 130/78 | HR 72 | Temp 98.1°F | Resp 16 | Wt 87.3 lb

## 2020-10-14 DIAGNOSIS — R634 Abnormal weight loss: Secondary | ICD-10-CM | POA: Insufficient documentation

## 2020-10-14 DIAGNOSIS — R0789 Other chest pain: Secondary | ICD-10-CM | POA: Insufficient documentation

## 2020-10-14 DIAGNOSIS — K862 Cyst of pancreas: Secondary | ICD-10-CM | POA: Diagnosis not present

## 2020-10-19 NOTE — Progress Notes (Signed)
Hematology/Oncology Consult note [facility]  [phone] [phone]  Patient Care Team: McLean-Scocuzza, Nino Glow, MD as PCP - General (Internal Medicine)   Name of the patient: Lochlyn Zullo  [id number]  [birth year]   Date of visit: 10/19/20  Diagnosis-abnormal weight loss of unclear etiology  Chief complaint/ Reason for visit-discuss results of blood work  Heme/Onc history: patient is a 74 year old female with a past medical history significant for pancreatic cyst who has been referred to me for unintentional weight loss.Patient was noted to have a pancreatic mass back in May 2020 which was seen on CT abdomen pelvis.  This was followed by a PET scan last year which did not show any significant metabolic activity in the pancreas and a repeat which did not show any significant metabolic activity in the pancreas and a repeat MRI was suggested in 6 months. Repeat MRI in November 2020 showed a 1.9 x 1.3 x 1.1 cm cystic mass in the pancreatic head. Patient was referred to Dr. Mont Dutton from [facility] and underwent EUS on 09/21/2020. Patient states that no biopsies were taken. Prior to that she also had another MRI in June 2021 which showed unchanged size of the cystic lesion.  Patient has been referred to me for ongoing weight loss.Patient has lost 6 pounds in the last 2 months and 10 pounds over the last 1 year. She has been a lifetime non-smoker. Reports that her appetite is fair and eats the best she can but has not been keeping a calorie count. She lives at assisted living. Denies any recurrent infections or hospitalizations. Patient currently reports pain along her right chest wall below her breasts. Denies any cough or shortness of breath.  Results of blood work from 10/04/2020 were as follows: CBC was normal with an H&H of 14.6/41.7 white count of 5 and platelet count of 235.  CMP showed an elevated bilirubin of 2.4 with a normal AST ALT and alkaline  phosphatase.  Iron studies were normal.  HIV and hepatitis C testing negative.  LDH normal at 166 and ESR normal at 2.  Interval history-patient says that she was not aware of her CT chest that was scheduled and therefore did not show up.  She reports ongoing fatigue and is trying to eat better  ECOG PS- 1 Pain scale- 0   Review of systems- Review of Systems  Constitutional: Positive for malaise/fatigue and weight loss. Negative for chills and fever.  HENT: Negative for congestion, ear discharge and nosebleeds.   Eyes: Negative for blurred vision.  Respiratory: Negative for cough, hemoptysis, sputum production, shortness of breath and wheezing.   Cardiovascular: Negative for chest pain, palpitations, orthopnea and claudication.  Gastrointestinal: Negative for abdominal pain, blood in stool, constipation, diarrhea, heartburn, melena, nausea and vomiting.  Genitourinary: Negative for dysuria, flank pain, frequency, hematuria and urgency.  Musculoskeletal: Negative for back pain, joint pain and myalgias.  Skin: Negative for rash.  Neurological: Negative for dizziness, tingling, focal weakness, seizures, weakness and headaches.  Endo/Heme/Allergies: Does not bruise/bleed easily.  Psychiatric/Behavioral: Negative for depression and suicidal ideas. The patient does not have insomnia.       Allergies  Allergen Reactions  . Amoxil [Amoxicillin]   . Tylenol [Acetaminophen]     ?reaction    . Ampicillin Rash  . Keflex [Cephalexin] Rash  . Macrobid [Nitrofurantoin] Rash  . Ofloxacin Rash  . Penicillins Rash  . Sulfur Rash     Past Medical History:  Diagnosis Date  .  Anxiety    knees, low back   . Arthritis   . History of chicken pox   . History of macular degeneration   . History of shingles    x3   . Osteoporosis   . Osteoporosis    declines medication   . 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  ABDOMINAL HYSTERECTOMY     DUB, endometriosis no h/o abnormal pap   . APPENDECTOMY    . COLONOSCOPY N/A 04/30/2015   Procedure: COLONOSCOPY;  Surgeon: Josefine Class, MD;  Location: [facility] ENDOSCOPY;  Service: Endoscopy;  Laterality: N/A;  . TONSILLECTOMY      Social History   Socioeconomic History  . Marital status: Widowed    Spouse name: Not on file  . Number of children: Not on file  . Years of education: Not on file  . Highest education level: Not on file  Occupational History  . Not on file  Tobacco Use  . Smoking status: Never Smoker  . Smokeless tobacco: Never Used  Vaping Use  . Vaping Use: Never used  Substance and Sexual Activity  . Alcohol use: Not Currently  . Drug use: Not Currently  . Sexual activity: Not Currently    Partners: Male  Other Topics Concern  . 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 Difficulty of Paying Living Expenses: Not hard at all  Food Insecurity: No Food Insecurity  . Worried About Charity fundraiser in the Last Year: Never true  . Ran Out of Food in the Last Year: Never true  Transportation Needs: No Transportation Needs  . Lack of Transportation (Medical): No  . Lack of Transportation (Non-Medical): No  Physical Activity: Unknown  . Days of Exercise per Week: 0 days  . Minutes of Exercise per Session: Not on file  Stress: No Stress Concern Present  . Feeling of Stress : Not at all  Social Connections: Unknown  . Frequency of Communication with Friends and Family: More than three times a week  . Frequency of Social Gatherings with Friends and Family: More than three  times a week  . Attends Religious Services: Not on file  . Active Member of Clubs or Organizations: Not on file  . Attends Archivist Meetings: Not on file  . Marital Status: Widowed  Intimate Partner Violence: Not At Risk  . Fear of Current or Ex-Partner: No  . Emotionally Abused: No  . Physically Abused: No  . Sexually Abused: No    Family History  Problem Relation Age of Onset  . Stroke Mother   . Cancer Father        prostate   . Pneumonia Maternal Grandmother   . Hip fracture Maternal Grandmother   . Stroke Paternal Grandfather   . Skin cancer Other   . Breast cancer Neg Hx      Current Outpatient  Medications:  .  ALPRAZolam (XANAX) 0.25 MG tablet, Take 1 tablet (0.25 mg total) by mouth at bedtime as needed for anxiety., Disp: 30 tablet, Rfl: 2 .  docusate sodium (COLACE) 100 MG capsule, Take 1 capsule (100 mg total) by mouth daily as needed for mild constipation., Disp: 60 capsule, Rfl: 11 .  gentamicin cream (GARAMYCIN) 0.1 %, Apply 1 application topically 2 (two) times daily., Disp: 15 g, Rfl: 1 .  ibuprofen (ADVIL,MOTRIN) 200 MG tablet, Take 200 mg by mouth 2 (two) times daily as needed for moderate pain. , Disp: , Rfl:  .  multivitamin-lutein (OCUVITE-LUTEIN) CAPS capsule, Take 1 capsule by mouth every other day., Disp: , Rfl:  .  polyethylene glycol (MIRALAX / GLYCOLAX) 17 g packet, Take 17 g by mouth daily as needed., Disp: 30 each, Rfl: 11 .  triamcinolone cream (KENALOG) 0.1 %, Apply 1 application topically 2 (two) times daily. Right upper back as needed, Disp: 80 g, Rfl: 5 .  famotidine (PEPCID) 20 MG tablet, Take 1 tablet (20 mg total) by mouth at bedtime., Disp: 90 tablet, Rfl: 3 .  omeprazole (PRILOSEC) 40 MG capsule, Take 40 mg by mouth daily.  (Patient not taking: Reported on 10/14/2020), Disp: , Rfl:   Physical exam:  Vitals:   10/14/20 1204  BP: 130/78  Pulse: 72  Resp: 16  Temp: 98.1 F (36.7 C)  TempSrc: Tympanic  SpO2: 99%  Weight: 87 lb  4.8 oz (39.6 kg)   Physical Exam Constitutional:      Comments: Appears fatigued thin and cachectic  Cardiovascular:     Rate and Rhythm: Normal rate and regular rhythm.     Heart sounds: Normal heart sounds.  Pulmonary:     Effort: Pulmonary effort is normal.     Breath sounds: Normal breath sounds.  Abdominal:     General: Bowel sounds are normal.     Palpations: Abdomen is soft.  Skin:    General: Skin is warm and dry.  Neurological:     Mental Status: She is alert and oriented to person, place, and time.      CMP Latest Ref Rng & Units 10/04/2020  Glucose 70 - 99 mg/dL 95  BUN 8 - 23 mg/dL 19  Creatinine 0.44 - 1.00 mg/dL 0.85  Sodium 135 - 145 mmol/L 135  Potassium 3.5 - 5.1 mmol/L 4.5  Chloride 98 - 111 mmol/L 100  CO2 22 - 32 mmol/L 28  Calcium 8.9 - 10.3 mg/dL 8.7(L)  Total Protein 6.5 - 8.1 g/dL 6.3(L)  Total Bilirubin 0.3 - 1.2 mg/dL 2.4(H)  Alkaline Phos 38 - 126 U/L 53  AST 15 - 41 U/L 24  ALT 0 - 44 U/L 27   CBC Latest Ref Rng & Units 10/04/2020  WBC 4.0 - 10.5 K/uL 5.0  Hemoglobin 12.0 - 15.0 g/dL 14.6  Hematocrit 36 - 46 % 41.7  Platelets 150 - 400 K/uL 235     Assessment and plan- Patient is a 74 y.o. female referred for abnormal weight loss  Patient CBC is within normal limits with a normal LDH and ESR.  There is no evidence of any hematological abnormality that would prompt a bone marrow biopsy at this time to explain her weight loss.Patient had an MRI of her abdomen recently to work-up her pancreatic cyst which did not show any other acute abnormalities.  EUS done at [facility] by Dr. Mont Dutton showed no concerning findings in the pancreatic cyst and therefore was not aspirated or biopsied  and was overall stable in size.  Patient does have an elevated bilirubin of 2.4 which will need to be followed or worked up and I will get in touch with Dr. Nicki Reaper boozer about the same.  Of note patient has had a mildly elevated bilirubin even dating back to 2019.  I  will proceed with a CT chest given her prior symptoms of chest wall pain to rule out any malignancy.  However the CT chest is unremarkable she does not require any further follow-up with heme-onc for weight loss as there is no overt evidence of malignancy at this time.  I will see her for a video visit after CT chest is done   Visit Diagnosis 1. Abnormal weight loss   2. Chest wall pain      Dr. Randa Evens, MD, MPH [facility] [phone] 10/19/2020 1:26 PM

## 2020-10-25 ENCOUNTER — Other Ambulatory Visit: Payer: Self-pay

## 2020-10-25 ENCOUNTER — Ambulatory Visit
Admission: RE | Admit: 2020-10-25 | Discharge: 2020-10-25 | Disposition: A | Discharge status: Home / Self Care | Payer: Medicare Other | Source: Ambulatory Visit | Attending: Oncology | Admitting: Oncology

## 2020-10-25 DIAGNOSIS — J984 Other disorders of lung: Secondary | ICD-10-CM | POA: Diagnosis not present

## 2020-10-25 DIAGNOSIS — R634 Abnormal weight loss: Secondary | ICD-10-CM | POA: Diagnosis not present

## 2020-10-25 DIAGNOSIS — R0789 Other chest pain: Secondary | ICD-10-CM | POA: Insufficient documentation

## 2020-10-25 DIAGNOSIS — I700 Atherosclerosis of aorta: Secondary | ICD-10-CM | POA: Diagnosis not present

## 2020-10-25 DIAGNOSIS — J9811 Atelectasis: Secondary | ICD-10-CM | POA: Diagnosis not present

## 2020-10-26 ENCOUNTER — Inpatient Hospital Stay: Payer: Medicare Other | Admitting: Oncology

## 2020-10-26 DIAGNOSIS — Z23 Encounter for immunization: Secondary | ICD-10-CM | POA: Diagnosis not present

## 2020-11-23 ENCOUNTER — Ambulatory Visit (INDEPENDENT_AMBULATORY_CARE_PROVIDER_SITE_OTHER): Payer: Medicare Other | Admitting: Internal Medicine

## 2020-11-23 ENCOUNTER — Other Ambulatory Visit: Payer: Self-pay

## 2020-11-23 ENCOUNTER — Encounter: Payer: Self-pay | Admitting: Internal Medicine

## 2020-11-23 VITALS — BP 126/80 | HR 65 | Temp 97.6°F | Ht 64.5 in | Wt 88.8 lb

## 2020-11-23 DIAGNOSIS — K2950 Unspecified chronic gastritis without bleeding: Secondary | ICD-10-CM

## 2020-11-23 DIAGNOSIS — K5900 Constipation, unspecified: Secondary | ICD-10-CM

## 2020-11-23 DIAGNOSIS — K2970 Gastritis, unspecified, without bleeding: Secondary | ICD-10-CM | POA: Diagnosis not present

## 2020-11-23 DIAGNOSIS — R634 Abnormal weight loss: Secondary | ICD-10-CM | POA: Diagnosis not present

## 2020-11-23 DIAGNOSIS — K8689 Other specified diseases of pancreas: Secondary | ICD-10-CM | POA: Diagnosis not present

## 2020-11-23 DIAGNOSIS — Z23 Encounter for immunization: Secondary | ICD-10-CM | POA: Diagnosis not present

## 2020-11-23 DIAGNOSIS — I700 Atherosclerosis of aorta: Secondary | ICD-10-CM

## 2020-11-23 DIAGNOSIS — F419 Anxiety disorder, unspecified: Secondary | ICD-10-CM

## 2020-11-23 DIAGNOSIS — R413 Other amnesia: Secondary | ICD-10-CM

## 2020-11-23 MED ORDER — CITALOPRAM HYDROBROMIDE 10 MG PO TABS: 10.0000 mg | ORAL_TABLET | Freq: Every day | ORAL | 3 refills | Status: DC

## 2020-11-23 MED ORDER — OMEPRAZOLE 40 MG PO CPDR: 40.0000 mg | DELAYED_RELEASE_CAPSULE | Freq: Every day | ORAL | 3 refills | Status: DC

## 2020-11-23 MED ORDER — POLYETHYLENE GLYCOL 3350 17 G PO PACK: 17.0000 g | PACK | Freq: Every day | ORAL | 11 refills | Status: DC | PRN

## 2020-11-23 MED ORDER — ALPRAZOLAM 0.25 MG PO TABS: 0.1250 mg | ORAL_TABLET | Freq: Every evening | ORAL | 2 refills | Status: DC | PRN

## 2020-11-23 MED ORDER — DOCUSATE SODIUM 100 MG PO CAPS: 100.0000 mg | ORAL_CAPSULE | Freq: Every day | ORAL | 11 refills | Status: DC | PRN

## 2020-11-23 NOTE — Progress Notes (Signed)
Chief Complaint  Patient presents with  . Follow-up   F/u 1. Weight loss w/u medically negative as etiology pancreatitic cyst she does have increased anxiety tried xanax 0.25 but caused her to be sleepy so takes prn  She has increased anxiety about finances and beneficiaries which is not done yet and she does have an attorney organizing these affairs and has not yet reached out to him   2. Gastritis noted on CT chest 10/2020 and prior EGD with chronic gastritis noted  On pepcid 20 mg qhs and needs refill prilosc 40 mg qd    Review of Systems  Constitutional: Positive for weight loss.  HENT: Negative for hearing loss.   Eyes: Negative for blurred vision.  Respiratory: Negative for shortness of breath.   Cardiovascular: Negative for chest pain.  Gastrointestinal: Negative for abdominal pain.  Skin: Negative for rash.  Psychiatric/Behavioral: The patient is nervous/anxious.    Past Medical History:  Diagnosis Date  . Anxiety    knees, low back   . Arthritis   . History of chicken pox   . History of macular degeneration   . History of shingles    x3   . Osteoporosis   . Osteoporosis    declines medication   . 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 ABDOMINAL HYSTERECTOMY     DUB, endometriosis no h/o abnormal pap   . APPENDECTOMY    . COLONOSCOPY N/A 04/30/2015   Procedure: COLONOSCOPY;  Surgeon: Josefine Class, MD;  Location: [facility] ENDOSCOPY;  Service: Endoscopy;  Laterality: N/A;  . TONSILLECTOMY     Family History  Problem Relation Age of Onset  . Stroke Mother   . Cancer Father        prostate   . Pneumonia Maternal Grandmother   . Hip fracture Maternal Grandmother   . Stroke Paternal Grandfather   . Skin cancer Other   . Breast cancer Neg Hx    Social History   Socioeconomic History  . Marital status: Widowed    Spouse name: Not on file  . Number of children: Not on file  . Years of  education: Not on file  . Highest education level: Not on file  Occupational History  . Not on file  Tobacco Use  . Smoking status: Never Smoker  . Smokeless tobacco: Never Used  Vaping Use  . Vaping Use: Never used  Substance and Sexual Activity  . Alcohol use: Not Currently  . Drug use: Not Currently  . Sexual activity: Not Currently    Partners: Male  Other Topics Concern  . Not on file  Social History Narrative   Widowed    Grew up in [place] moved around when husband living    No kids    Lives [facility]    Never smoker    Likes to pain for [place]    Enjoys plays    No guns    Wears seat belt, safe in relationship   Retired Teacher, music [phone]    Social Determinants of Health   Financial Resource Strain: [place]   . Difficulty of Paying Living Expenses: Not hard at all  Food Insecurity: No Food Insecurity  . Worried About Charity fundraiser in the Last Year: Never true  . Ran Out of Food in the Last Year: Never true  Transportation  Needs: No Transportation Needs  . Lack of Transportation (Medical): No  . Lack of Transportation (Non-Medical): No  Physical Activity: Unknown  . Days of Exercise per Week: 0 days  . Minutes of Exercise per Session: Not on file  Stress: No Stress Concern Present  . Feeling of Stress : Not at all  Social Connections: Unknown  . Frequency of Communication with Friends and Family: More than three times a week  . Frequency of Social Gatherings with Friends and Family: More than three times a week  . Attends Religious Services: Not on file  . Active Member of Clubs or Organizations: Not on file  . Attends Archivist Meetings: Not on file  . Marital Status: Widowed  Intimate Partner Violence: Not At Risk  . Fear of Current or Ex-Partner: No  . Emotionally Abused: No  . Physically Abused: No  . Sexually Abused: No   Current Meds  Medication Sig  . famotidine (PEPCID) 20 MG  tablet Take 1 tablet (20 mg total) by mouth at bedtime.  Marland Kitchen gentamicin cream (GARAMYCIN) 0.1 % Apply 1 application topically 2 (two) times daily.  Marland Kitchen ibuprofen (ADVIL,MOTRIN) 200 MG tablet Take 200 mg by mouth 2 (two) times daily as needed for moderate pain.   . multivitamin-lutein (OCUVITE-LUTEIN) CAPS capsule Take 1 capsule by mouth every other day.  . triamcinolone cream (KENALOG) 0.1 % Apply 1 application topically 2 (two) times daily. Right upper back as needed  . [DISCONTINUED] ALPRAZolam (XANAX) 0.25 MG tablet Take 1 tablet (0.25 mg total) by mouth at bedtime as needed for anxiety.  . [DISCONTINUED] docusate sodium (COLACE) 100 MG capsule Take 1 capsule (100 mg total) by mouth daily as needed for mild constipation.  . [DISCONTINUED] omeprazole (PRILOSEC) 40 MG capsule Take 40 mg by mouth daily.  . [DISCONTINUED] polyethylene glycol (MIRALAX / GLYCOLAX) 17 g packet Take 17 g by mouth daily as needed.   Allergies  Allergen Reactions  . Amoxil [Amoxicillin]   . Tylenol [Acetaminophen]     ?reaction    . Ampicillin Rash  . Keflex [Cephalexin] Rash  . Macrobid [Nitrofurantoin] Rash  . Ofloxacin Rash  . Penicillins Rash  . Sulfur Rash   Recent Results (from the past 2160 hour(s))  Comprehensive metabolic panel     Status: Abnormal   Collection Time: 09/17/20  3:31 PM  Result Value Ref Range   Glucose, Bld 96 65 - 99 mg/dL    Comment: .            Fasting reference interval .    BUN 17 7 - 25 mg/dL   Creat 1.28 (H) 0.60 - 0.93 mg/dL    Comment: For patients >45 years of age, the reference limit for Creatinine is approximately 13% higher for people identified as African-American. .    BUN/Creatinine Ratio 13 6 - 22 (calc)   Sodium 138 135 - 146 mmol/L   Potassium 4.3 3.5 - 5.3 mmol/L   Chloride 103 98 - 110 mmol/L   CO2 29 20 - 32 mmol/L   Calcium 9.2 8.6 - 10.4 mg/dL   Total Protein 5.8 (L) 6.1 - 8.1 g/dL   Albumin 4.2 3.6 - 5.1 g/dL   Globulin 1.6 (L) 1.9 - 3.7 g/dL  (calc)   AG Ratio 2.6 (H) 1.0 - 2.5 (calc)   Total Bilirubin 1.7 (H) 0.2 - 1.2 mg/dL   Alkaline phosphatase (APISO) 47 37 - 153 U/L   AST 20 10 - 35 U/L  ALT 22 6 - 29 U/L  CBC with Differential/Platelet     Status: Abnormal   Collection Time: 09/17/20  3:31 PM  Result Value Ref Range   WBC 5.5 3.8 - 10.8 Thousand/uL   RBC 4.10 3.80 - 5.10 Million/uL   Hemoglobin 13.7 11.7 - 15.5 g/dL   HCT 40.1 35.0 - 45.0 %   MCV 97.8 80.0 - 100.0 fL   MCH 33.4 (H) 27.0 - 33.0 pg   MCHC 34.2 32.0 - 36.0 g/dL   RDW 12.2 11.0 - 15.0 %   Platelets 237 140 - 400 Thousand/uL   MPV 10.3 7.5 - 12.5 fL   Neutro Abs 3,916 1,500 - 7,800 cells/uL   Lymphs Abs 1,106 850 - 3,900 cells/uL   Absolute Monocytes 435 200 - 950 cells/uL   Eosinophils Absolute 22 15 - 500 cells/uL   Basophils Absolute 22 0 - 200 cells/uL   Neutrophils Relative % 71.2 %   Total Lymphocyte 20.1 %   Monocytes Relative 7.9 %   Eosinophils Relative 0.4 %   Basophils Relative 0.4 %  TSH     Status: None   Collection Time: 09/17/20  3:31 PM  Result Value Ref Range   TSH 1.79 0.40 - 4.50 mIU/L  Vitamin D (25 hydroxy)     Status: None   Collection Time: 09/17/20  3:31 PM  Result Value Ref Range   Vit D, 25-Hydroxy 32 30 - 100 ng/mL    Comment: Vitamin D Status         25-OH Vitamin D: . Deficiency:                    <20 ng/mL Insufficiency:             20 - 29 ng/mL Optimal:                 > or = 30 ng/mL . For 25-OH Vitamin D testing on patients on  D2-supplementation and patients for whom quantitation  of D2 and D3 fractions is required, the [facility](TM) 25-OH VIT D, (D2,D3), LC/MS/MS is recommended: order  code 972 [id number] 5627 (patients >78yrs). See Note 1 . Note 1 . For additional information, please refer to  http://education.[facility].com/faq/FAQ199  (This link is being provided for informational/ educational purposes only.)   B12     Status: None   Collection Time: 09/17/20  3:31 PM  Result Value Ref Range    Vitamin B-12 498 200 - 1,100 pg/mL  Celiac Disease Ab Screen w/Rfx     Status: None   Collection Time: 09/17/20  3:31 PM  Result Value Ref Range   Antigliadin Abs, IgA 2 0 - 19 units    Comment:                    Negative                   0 - 19                    Weak Positive             20 - 30                    Moderate to Strong Positive   >30    Transglutaminase IgA <2 0 - 3 U/mL    Comment:  Negative        0 -  3                               Weak Positive   4 - 10                               Positive           >10  Tissue Transglutaminase (tTG) has been identified  as the endomysial antigen.  Studies have demonstr-  ated that endomysial IgA antibodies have over 99%  specificity for gluten sensitive enteropathy.    IgA/Immunoglobulin A, Serum 93 64 - 422 mg/dL  Urinalysis, Routine w reflex microscopic     Status: None   Collection Time: 09/17/20  3:31 PM  Result Value Ref Range   Color, Urine YELLOW YELLOW   APPearance CLEAR CLEAR   Specific Gravity, Urine 1.016 1.001 - 1.03   pH 5.5 5.0 - 8.0   Glucose, UA NEGATIVE NEGATIVE   Bilirubin Urine NEGATIVE NEGATIVE   Ketones, ur NEGATIVE NEGATIVE   Hgb urine dipstick NEGATIVE NEGATIVE   Protein, ur NEGATIVE NEGATIVE   Nitrite NEGATIVE NEGATIVE   Leukocytes,Ua NEGATIVE NEGATIVE   WBC, UA NONE SEEN 0 - 5 /HPF   RBC / HPF NONE SEEN 0 - 2 /HPF   Squamous Epithelial / LPF NONE SEEN < OR = 5 /HPF   Bacteria, UA NONE SEEN NONE SEEN /HPF   Hyaline Cast NONE SEEN NONE SEEN /LPF  Urine Culture     Status: None   Collection Time: 09/17/20  3:31 PM   Specimen: Urine  Result Value Ref Range   MICRO NUMBER: [id number]    SPECIMEN QUALITY: Adequate    Sample Source URINE    STATUS: FINAL    Result: No Growth   Sedimentation rate     Status: None   Collection Time: 10/04/20  2:33 PM  Result Value Ref Range   Sed Rate 2 0 - 30 mm/hr    Comment: Performed at [facility], [address] [place], [place] [address]  Lactate dehydrogenase     Status: None   Collection Time: 10/04/20  2:33 PM  Result Value Ref Range   LDH 166 98 - 192 U/L    Comment: Performed at [facility], [address] [place], [place] [address]  HIV ANTIBODY (ROUTINE TETSING W RELFEX)     Status: None   Collection Time: 10/04/20  2:33 PM  Result Value Ref Range   HIV Screen 4th Generation wRfx Non Reactive Non Reactive    Comment: Performed at [facility], [address], [place], [place] [address]  Hepatitis C antibody     Status: None   Collection Time: 10/04/20  2:33 PM  Result Value Ref Range   HCV Ab NON REACTIVE NON REACTIVE    Comment: (NOTE) Nonreactive HCV antibody screen is consistent with no HCV infections,  unless recent infection is suspected or other evidence exists to indicate HCV infection.  Performed at [facility], [address] [place], [place] [address]   Iron and TIBC     Status: None   Collection Time: 10/04/20  2:33 PM  Result Value Ref Range   Iron 84 28 - 170 ug/dL   TIBC 335 250 - 450 ug/dL   Saturation Ratios 25  10.4 - 31.8 %   UIBC 251 ug/dL    Comment: Performed at [facility], [address] [place], [place] [address]  Ferritin     Status: None   Collection Time: 10/04/20  2:33 PM  Result Value Ref Range   Ferritin 140 11 - 307 ng/mL    Comment: Performed at [facility], [address] [place], [place] [address]  Comprehensive metabolic panel     Status: Abnormal   Collection Time: 10/04/20  2:33 PM  Result Value Ref Range   Sodium 135 135 - 145 mmol/L   Potassium 4.5 3.5 - 5.1 mmol/L   Chloride 100 98 - 111 mmol/L   CO2 28 22 - 32 mmol/L   Glucose, Bld 95 70 - 99 mg/dL    Comment: Glucose reference range applies only to samples taken after fasting for at least 8 hours.   BUN 19 8 - 23 mg/dL   Creatinine, Ser 0.85 0.44 - 1.00 mg/dL   Calcium 8.7 (L) 8.9 - 10.3 mg/dL   Total Protein 6.3 (L) 6.5 - 8.1  g/dL   Albumin 4.4 3.5 - 5.0 g/dL   AST 24 15 - 41 U/L   ALT 27 0 - 44 U/L   Alkaline Phosphatase 53 38 - 126 U/L   Total Bilirubin 2.4 (H) 0.3 - 1.2 mg/dL   GFR, Estimated >60 >60 mL/min    Comment: (NOTE) Calculated using the CKD-EPI Creatinine Equation (2021)    Anion gap 7 5 - 15    Comment: Performed at [facility], [address] [place], [place] [address]  CBC with Differential/Platelet     Status: None   Collection Time: 10/04/20  2:33 PM  Result Value Ref Range   WBC 5.0 4.0 - 10.5 K/uL   RBC 4.38 3.87 - 5.11 MIL/uL   Hemoglobin 14.6 12.0 - 15.0 g/dL   HCT 41.7 36.0 - 46.0 %   MCV 95.2 80.0 - 100.0 fL   MCH 33.3 26.0 - 34.0 pg   MCHC 35.0 30.0 - 36.0 g/dL   RDW 12.1 11.5 - 15.5 %   Platelets 235 150 - 400 K/uL   nRBC 0.0 0.0 - 0.2 %   Neutrophils Relative % 65 %   Neutro Abs 3.2 1.7 - 7.7 K/uL   Lymphocytes Relative 28 %   Lymphs Abs 1.4 0.7 - 4.0 K/uL   Monocytes Relative 7 %   Monocytes Absolute 0.4 0.1 - 1.0 K/uL   Eosinophils Relative 0 %   Eosinophils Absolute 0.0 0.0 - 0.5 K/uL   Basophils Relative 0 %   Basophils Absolute 0.0 0.0 - 0.1 K/uL   Immature Granulocytes 0 %   Abs Immature Granulocytes 0.01 0.00 - 0.07 K/uL    Comment: Performed at [facility], [address] [place], [place] [address]   Objective  Body mass index is 15.01 kg/m. Wt Readings from Last 3 Encounters:  11/23/20 88 lb 12.8 oz (40.3 kg)  10/14/20 87 lb 4.8 oz (39.6 kg)  10/04/20 86 lb 3.2 oz (39.1 kg)   Temp Readings from Last 3 Encounters:  11/23/20 97.6 F (36.4 C) (Oral)  10/14/20 98.1 F (36.7 C) (Tympanic)  10/04/20 98 F (36.7 C)   BP Readings from Last 3 Encounters:  11/23/20 126/80  10/14/20 130/78  10/04/20 132/73   Pulse Readings from Last 3 Encounters:  11/23/20 65  10/14/20 72  10/04/20 65    Physical Exam Vitals and nursing note reviewed.  Constitutional:      Appearance: Normal appearance. She is well-developed and well-groomed.   HENT:     Head: Normocephalic and atraumatic.  Eyes:     Conjunctiva/sclera: Conjunctivae normal.     Pupils: Pupils are equal, round, and reactive to light.  Cardiovascular:     Rate and Rhythm: Normal rate and regular rhythm.     Heart sounds: Normal heart sounds. No murmur heard.   Pulmonary:     Effort: Pulmonary effort is normal.     Breath sounds: Normal breath sounds.  Skin:    General: Skin is warm and dry.  Neurological:     General: No focal deficit present.     Mental Status: She is alert and oriented to person, place, and time. Mental status is at baseline.     Gait: Gait normal.  Psychiatric:        Attention and Perception: Attention and perception normal.        Mood and Affect: Mood and affect normal.        Speech: Speech normal.        Behavior: Behavior normal. Behavior is cooperative.        Thought Content: Thought content normal.        Cognition and Memory: Cognition and memory normal.        Judgment: Judgment normal.     Assessment  Plan  Abnormal weight loss medical w/u negative gi and h/o Likely due to anxiety  Anxiety - Plan: ALPRAZolam (XANAX) 0.25 MG tablet, citalopram (CELEXA) 10 MG tablet  Chronic Gastritis without bleeding, unspecified chronicity, unspecified gastritis type - Plan: omeprazole (PRILOSEC) 40 MG capsule pepcid 20 mg qhs    Constipation, unspecified constipation type - Plan: polyethylene glycol (MIRALAX / GLYCOLAX) 17 g packet, docusate sodium (COLACE) 100 MG capsule   HM Flu shotutd pna 23 had10/1/16 prevnargiven today Given Rx shingrixprevRx 2/2 given Tdap had 10/11/12  zostervax had Hep C neg 01/07/19 Hep B not immune, hep A immune 01/07/19 covid vx had 2/70moderna disc moderna booster had utd at [facility] 10/26/2020   Never smoker   No need for pap s/p hysterectomy DUB, endometriosis   Mammogram10/8/20ordered 09/17/20  A1C 5.6 dexa had 11/15/2017 +osteoporosis -2.9 left femur will  repeatordered -Pt never took fosamax disc proliaprev.declines 2/2 c/w side effects  Colonoscopy h/o polyp tubular 05/03/15 Had repeat EGD/colonoscopy 06/17/2019 [facility]. 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 Olivia Mackie McLean-Scocuzza-Internal Medicine

## 2020-11-23 NOTE — Patient Instructions (Addendum)
prevnar vaccine given today  Pneumococcal Conjugate Vaccine suspension for injection What is this medicine? PNEUMOCOCCAL VACCINE (NEU mo KOK al vak SEEN) is a vaccine used to prevent pneumococcus bacterial infections. These bacteria can cause serious infections like pneumonia, meningitis, and blood infections. This vaccine will lower your chance of getting pneumonia. If you do get pneumonia, it can make your symptoms milder and your illness shorter. This vaccine will not treat an infection and will not cause infection. This vaccine is recommended for infants and young children, adults with certain medical conditions, and adults 26 years or older. This medicine may be used for other purposes; ask your health care provider or pharmacist if you have questions. COMMON BRAND NAME(S): Prevnar, Prevnar 13 What should I tell my health care provider before I take this medicine? They need to know if you have any of these conditions:  bleeding problems  fever  immune system problems  an unusual or allergic reaction to pneumococcal vaccine, diphtheria toxoid, other vaccines, latex, other medicines, foods, dyes, or preservatives  pregnant or trying to get pregnant  breast-feeding How should I use this medicine? This vaccine is for injection into a muscle. It is given by a health care professional. A copy of Vaccine Information Statements will be given before each vaccination. Read this sheet carefully each time. The sheet may change frequently. Talk to your pediatrician regarding the use of this medicine in children. While this drug may be prescribed for children as young as 14 weeks old for selected conditions, precautions do apply. Overdosage: If you think you have taken too much of this medicine contact a poison control center or emergency room at once. NOTE: This medicine is only for you. Do not share this medicine with others. What if I miss a dose? It is important not to miss your dose. Call your  doctor or health care professional if you are unable to keep an appointment. What may interact with this medicine?  medicines for cancer chemotherapy  medicines that suppress your immune function  steroid medicines like prednisone or cortisone This list may not describe all possible interactions. Give your health care provider a list of all the medicines, herbs, non-prescription drugs, or dietary supplements you use. Also tell them if you smoke, drink alcohol, or use illegal drugs. Some items may interact with your medicine. What should I watch for while using this medicine? Mild fever and pain should go away in 3 days or less. Report any unusual symptoms to your doctor or health care professional. What side effects may I notice from receiving this medicine? Side effects that you should report to your doctor or health care professional as soon as possible:  allergic reactions like skin rash, itching or hives, swelling of the face, lips, or tongue  breathing problems  confused  fast or irregular heartbeat  fever over 102 degrees F  seizures  unusual bleeding or bruising  unusual muscle weakness Side effects that usually do not require medical attention (report to your doctor or health care professional if they continue or are bothersome):  aches and pains  diarrhea  fever of 102 degrees F or less  headache  irritable  loss of appetite  pain, tender at site where injected  trouble sleeping This list may not describe all possible side effects. Call your doctor for medical advice about side effects. You may report side effects to FDA at 1-800-FDA-1088. Where should I keep my medicine? This does not apply. This vaccine is given in  a clinic, pharmacy, doctor's office, or other health care setting and will not be stored at home. NOTE: This sheet is a summary. It may not cover all possible information. If you have questions about this medicine, talk to your doctor,  pharmacist, or health care provider.  2020 Elsevier/Gold Standard (2014-09-03 10:27:27)  Citalopram tablets What is this medicine? CITALOPRAM (sye TAL oh pram) is a medicine for depression. This medicine may be used for other purposes; ask your health care provider or pharmacist if you have questions. COMMON BRAND NAME(S): Celexa What should I tell my health care provider before I take this medicine? They need to know if you have any of these conditions:  bleeding disorders  bipolar disorder or a family history of bipolar disorder  glaucoma  heart disease  history of irregular heartbeat  kidney disease  liver disease  low levels of magnesium or potassium in the blood  receiving electroconvulsive therapy  seizures  suicidal thoughts, plans, or attempt; a previous suicide attempt by you or a family member  take medicines that treat or prevent blood clots  thyroid disease  an unusual or allergic reaction to citalopram, escitalopram, other medicines, foods, dyes, or preservatives  pregnant or trying to become pregnant  breast-feeding How should I use this medicine? Take this medicine by mouth with a glass of water. Follow the directions on the prescription label. You can take it with or without food. Take your medicine at regular intervals. Do not take your medicine more often than directed. Do not stop taking this medicine suddenly except upon the advice of your doctor. Stopping this medicine too quickly may cause serious side effects or your condition may worsen. A special MedGuide will be given to you by the pharmacist with each prescription and refill. Be sure to read this information carefully each time. Talk to your pediatrician regarding the use of this medicine in children. Special care may be needed. Patients over 36 years old may have a stronger reaction and need a smaller dose. Overdosage: If you think you have taken too much of this medicine contact a poison  control center or emergency room at once. NOTE: This medicine is only for you. Do not share this medicine with others. What if I miss a dose? If you miss a dose, take it as soon as you can. If it is almost time for your next dose, take only that dose. Do not take double or extra doses. What may interact with this medicine? Do not take this medicine with any of the following medications:  certain medicines for fungal infections like fluconazole, itraconazole, ketoconazole, posaconazole, voriconazole  cisapride  dronedarone  escitalopram  linezolid  MAOIs like Carbex, Eldepryl, Marplan, Nardil, and Parnate  methylene blue (injected into a vein)  pimozide  thioridazine This medicine may also interact with the following medications:  alcohol  amphetamines  aspirin and aspirin-like medicines  carbamazepine  certain medicines for depression, anxiety, or psychotic disturbances  certain medicines for infections like chloroquine, clarithromycin, erythromycin, furazolidone, isoniazid, pentamidine  certain medicines for migraine headaches like almotriptan, eletriptan, frovatriptan, naratriptan, rizatriptan, sumatriptan, zolmitriptan  certain medicines for sleep  certain medicines that treat or prevent blood clots like dalteparin, enoxaparin, warfarin  cimetidine  diuretics  dofetilide  fentanyl  lithium  methadone  metoprolol  NSAIDs, medicines for pain and inflammation, like ibuprofen or naproxen  omeprazole  other medicines that prolong the QT interval (cause an abnormal heart rhythm)  procarbazine  rasagiline  supplements like St. John's  wort, kava kava, valerian  tramadol  tryptophan  ziprasidone This list may not describe all possible interactions. Give your health care provider a list of all the medicines, herbs, non-prescription drugs, or dietary supplements you use. Also tell them if you smoke, drink alcohol, or use illegal drugs. Some items may  interact with your medicine. What should I watch for while using this medicine? Tell your doctor if your symptoms do not get better or if they get worse. Visit your doctor or health care professional for regular checks on your progress. Because it may take several weeks to see the full effects of this medicine, it is important to continue your treatment as prescribed by your doctor. Patients and their families should watch out for new or worsening thoughts of suicide or depression. Also watch out for sudden changes in feelings such as feeling anxious, agitated, panicky, irritable, hostile, aggressive, impulsive, severely restless, overly excited and hyperactive, or not being able to sleep. If this happens, especially at the beginning of treatment or after a change in dose, call your health care professional. Dennis Bast may get drowsy or dizzy. Do not drive, use machinery, or do anything that needs mental alertness until you know how this medicine affects you. Do not stand or sit up quickly, especially if you are an older patient. This reduces the risk of dizzy or fainting spells. Alcohol may interfere with the effect of this medicine. Avoid alcoholic drinks. Your mouth may get dry. Chewing sugarless gum or sucking hard candy, and drinking plenty of water will help. Contact your doctor if the problem does not go away or is severe. What side effects may I notice from receiving this medicine? Side effects that you should report to your doctor or health care professional as soon as possible:  allergic reactions like skin rash, itching or hives, swelling of the face, lips, or tongue  anxious  black, tarry stools  breathing problems  changes in vision  chest pain  confusion  elevated mood, decreased need for sleep, racing thoughts, impulsive behavior  eye pain  fast, irregular heartbeat  feeling faint or lightheaded, falls  feeling agitated, angry, or irritable  hallucination, loss of contact  with reality  loss of balance or coordination  loss of memory  painful or prolonged erections  restlessness, pacing, inability to keep still  seizures  stiff muscles  suicidal thoughts or other mood changes  trouble sleeping  unusual bleeding or bruising  unusually weak or tired  vomiting Side effects that usually do not require medical attention (report to your doctor or health care professional if they continue or are bothersome):  change in appetite or weight  change in sex drive or performance  dizziness  headache  increased sweating  indigestion, nausea  tremors This list may not describe all possible side effects. Call your doctor for medical advice about side effects. You may report side effects to FDA at 1-800-FDA-1088. Where should I keep my medicine? Keep out of reach of children. Store at room temperature between 15 and 30 degrees C (59 and 86 degrees F). Throw away any unused medicine after the expiration date. NOTE: This sheet is a summary. It may not cover all possible information. If you have questions about this medicine, talk to your doctor, pharmacist, or health care provider.  2020 Elsevier/Gold Standard (2018-11-18 09:05:36)

## 2020-12-14 ENCOUNTER — Telehealth: Payer: Self-pay | Admitting: Internal Medicine

## 2020-12-14 NOTE — Telephone Encounter (Signed)
Pt would like a call back about her weight loss and how it has continued she doesn't believe it is do to anxiety

## 2020-12-15 NOTE — Telephone Encounter (Signed)
Patient states that she is continuing to loose weight and does not think anxiety is the only cause. Patient states she went back to bed for the first time today. Patient seen for this 11/23/20. Please advise

## 2020-12-15 NOTE — Telephone Encounter (Signed)
Has seen GI and heme/onc  I dont have anything else to be the cause do you have any other etiologies of weight loss please help?

## 2020-12-15 NOTE — Telephone Encounter (Signed)
She has nothing on her recent scans to suggest malignancy. Her cbc is stone cold normal and she therefore does not need a bone marrow biopsy. I went through the entire algorithm of weight loss and I am not sure what is the etiology for her weight loss

## 2020-12-15 NOTE — Telephone Encounter (Signed)
Can she call [facility] and schedule mammogram give # please  PET scan in the past negative  Does she want to repeat a PET scan?  Does she want a stool test for parasites? Which can cause weight loss  Does she want to see neurology to work her up for dementia, parkinsons, ALS or giant cell arteritis which can cause weight loss?  Reached out to hematology/oncology Dr.Rao she has no etiology and does not think she needs bone marrow biopsy She has already had upper endoscopy and colonoscopy    What is current weight?   

## 2020-12-16 NOTE — Telephone Encounter (Signed)
Patient informed and verbalized understanding.  She is agreeable to Pet scan, Stool testing, and neurology referral.  Patient has scheduled mammogram for 01/07/21.   She will await calls for scheduling and stool test kit.

## 2020-12-23 NOTE — Addendum Note (Signed)
Addended by: Orland Mustard on: 12/23/2020 05:27 PM   Modules accepted: Orders

## 2020-12-23 NOTE — Telephone Encounter (Signed)
Stool test negative parasites 08/04/19 I do not think we need to check again  Ordered pet scan and [facility] clinic neurology

## 2020-12-24 NOTE — Addendum Note (Signed)
Addended by: Orland Mustard on: 12/24/2020 09:42 AM   Modules accepted: Orders

## 2020-12-24 NOTE — Telephone Encounter (Signed)
Received and sent. 

## 2020-12-24 NOTE — Telephone Encounter (Signed)
Patient informed and verbalized understanding

## 2021-01-06 ENCOUNTER — Encounter
Admission: RE | Admit: 2021-01-06 | Discharge: 2021-01-06 | Disposition: A | Discharge status: Home / Self Care | Payer: Medicare Other | Source: Ambulatory Visit | Attending: Internal Medicine | Admitting: Internal Medicine

## 2021-01-06 ENCOUNTER — Other Ambulatory Visit: Payer: Self-pay

## 2021-01-06 DIAGNOSIS — K2970 Gastritis, unspecified, without bleeding: Secondary | ICD-10-CM | POA: Insufficient documentation

## 2021-01-06 DIAGNOSIS — R634 Abnormal weight loss: Secondary | ICD-10-CM | POA: Diagnosis not present

## 2021-01-06 DIAGNOSIS — K8689 Other specified diseases of pancreas: Secondary | ICD-10-CM | POA: Diagnosis not present

## 2021-01-06 DIAGNOSIS — I700 Atherosclerosis of aorta: Secondary | ICD-10-CM | POA: Diagnosis not present

## 2021-01-06 LAB — GLUCOSE, CAPILLARY: Glucose-Capillary: 81 mg/dL (ref 70–99)

## 2021-01-06 MED ORDER — FLUDEOXYGLUCOSE F - 18 (FDG) INJECTION
5.1630 | Freq: Once | INTRAVENOUS | Status: AC | PRN
  Administered 2021-01-06: 5.163 via INTRAVENOUS

## 2021-01-07 ENCOUNTER — Ambulatory Visit
Admission: RE | Admit: 2021-01-07 | Discharge: 2021-01-07 | Disposition: A | Discharge status: Home / Self Care | Payer: Medicare Other | Source: Ambulatory Visit | Attending: Internal Medicine | Admitting: Internal Medicine

## 2021-01-07 DIAGNOSIS — Z1231 Encounter for screening mammogram for malignant neoplasm of breast: Secondary | ICD-10-CM | POA: Diagnosis not present

## 2021-01-19 ENCOUNTER — Telehealth: Payer: Self-pay | Admitting: Internal Medicine

## 2021-01-19 NOTE — Telephone Encounter (Signed)
Patient calling back in and was informed of the below.

## 2021-01-19 NOTE — Telephone Encounter (Signed)
Thressa Sheller, CMA  01/18/2021 3:03 PM EST      Left message to return call.  Letter sent    Thressa Sheller, [facility]  01/10/2021 2:39 PM EST      No answer, no voicemail.    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

## 2021-02-22 ENCOUNTER — Other Ambulatory Visit: Payer: Self-pay | Admitting: Neurology

## 2021-02-22 DIAGNOSIS — E519 Thiamine deficiency, unspecified: Secondary | ICD-10-CM | POA: Diagnosis not present

## 2021-02-22 DIAGNOSIS — F0390 Unspecified dementia without behavioral disturbance: Secondary | ICD-10-CM | POA: Diagnosis not present

## 2021-02-22 DIAGNOSIS — E538 Deficiency of other specified B group vitamins: Secondary | ICD-10-CM | POA: Diagnosis not present

## 2021-02-22 DIAGNOSIS — E559 Vitamin D deficiency, unspecified: Secondary | ICD-10-CM | POA: Diagnosis not present

## 2021-03-07 ENCOUNTER — Other Ambulatory Visit: Payer: Self-pay

## 2021-03-07 ENCOUNTER — Ambulatory Visit
Admission: RE | Admit: 2021-03-07 | Discharge: 2021-03-07 | Disposition: A | Discharge status: Home / Self Care | Payer: Medicare Other | Source: Ambulatory Visit | Attending: Neurology | Admitting: Neurology

## 2021-03-07 DIAGNOSIS — R413 Other amnesia: Secondary | ICD-10-CM | POA: Diagnosis not present

## 2021-03-07 DIAGNOSIS — F0390 Unspecified dementia without behavioral disturbance: Secondary | ICD-10-CM | POA: Diagnosis not present

## 2021-03-09 ENCOUNTER — Telehealth: Payer: Self-pay
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
## 2021-03-11 ENCOUNTER — Ambulatory Visit: Payer: Medicare Other | Admitting: Internal Medicine

## 2021-03-11 ENCOUNTER — Telehealth: Payer: Self-pay | Admitting: Internal Medicine

## 2021-03-11 NOTE — Telephone Encounter (Signed)
Patient no-showed today's appointment; appointment was for 03/11/21, provider notified for review of record. Letter sent for patient to call in and re-schedule.

## 2021-03-24 ENCOUNTER — Ambulatory Visit: Payer: Medicare Other | Admitting: Internal Medicine

## 2021-03-28 DIAGNOSIS — R4189 Other symptoms and signs involving cognitive functions and awareness: Secondary | ICD-10-CM | POA: Diagnosis not present

## 2021-03-28 DIAGNOSIS — R278 Other lack of coordination: Secondary | ICD-10-CM | POA: Diagnosis not present

## 2021-04-01 ENCOUNTER — Encounter: Payer: Self-pay | Admitting: Internal Medicine

## 2021-04-01 ENCOUNTER — Other Ambulatory Visit: Payer: Self-pay

## 2021-04-01 ENCOUNTER — Ambulatory Visit (INDEPENDENT_AMBULATORY_CARE_PROVIDER_SITE_OTHER): Payer: Medicare Other | Admitting: Internal Medicine

## 2021-04-01 VITALS — BP 104/68 | HR 78 | Temp 98.1°F | Ht 64.5 in | Wt 85.8 lb

## 2021-04-01 DIAGNOSIS — R269 Unspecified abnormalities of gait and mobility: Secondary | ICD-10-CM | POA: Diagnosis not present

## 2021-04-01 DIAGNOSIS — I680 Cerebral amyloid angiopathy: Secondary | ICD-10-CM | POA: Diagnosis not present

## 2021-04-01 DIAGNOSIS — R627 Adult failure to thrive: Secondary | ICD-10-CM

## 2021-04-01 DIAGNOSIS — E854 Organ-limited amyloidosis: Secondary | ICD-10-CM | POA: Diagnosis not present

## 2021-04-01 DIAGNOSIS — R636 Underweight: Secondary | ICD-10-CM

## 2021-04-01 DIAGNOSIS — E440 Moderate protein-calorie malnutrition: Secondary | ICD-10-CM

## 2021-04-01 DIAGNOSIS — F0280 Dementia in other diseases classified elsewhere without behavioral disturbance: Secondary | ICD-10-CM | POA: Diagnosis not present

## 2021-04-01 DIAGNOSIS — G300 Alzheimer's disease with early onset: Secondary | ICD-10-CM

## 2021-04-01 NOTE — Progress Notes (Signed)
Chief Complaint  Patient presents with  . Follow-up   F/u  1. Still with abnormal wt loss all imaging and GI w/u negative pt does have dementia per Dr. Sherryll Burger and was Rx 02/2021 Namenda 5 mg bid but did not start wt continues to trend down will refer to nutrition pt to check at [facility] to see if they have nutrition there  MRI with cerebral amyloid disc with Dr. Sherryll Burger and he does not typically tx with steroids though reviewed this on up to date  Rx rollator today and Rx for nutrition at [facility] if [facility] does not have nutrition will refer out  She is not skipping meals wt is underweight   Review of Systems  Constitutional: Positive for weight loss.  HENT: Negative for hearing loss.   Eyes: Negative for blurred vision.  Respiratory: Negative for shortness of breath.   Cardiovascular: Negative for chest pain.  Musculoskeletal: Negative for falls and joint pain.       +abnormal gait but no falls   Skin: Negative for rash.  Psychiatric/Behavioral: Positive for memory loss.   Past Medical History:  Diagnosis Date  . Anxiety    knees, low back   . Arthritis   . History of chicken pox   . History of macular degeneration   . History of shingles    x3   . Osteoporosis   . Osteoporosis    declines medication   . 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 ABDOMINAL HYSTERECTOMY     DUB, endometriosis no h/o abnormal pap   . APPENDECTOMY    . COLONOSCOPY N/A 04/30/2015   Procedure: COLONOSCOPY;  Surgeon: Elnita Maxwell, MD;  Location: [facility] ENDOSCOPY;  Service: Endoscopy;  Laterality: N/A;  . TONSILLECTOMY     Family History  Problem Relation Age of Onset  . Stroke Mother   . Cancer Father        prostate   . Pneumonia Maternal Grandmother   . Hip fracture Maternal Grandmother   . Stroke Paternal Grandfather   . Skin cancer Other   . Breast cancer Neg Hx    Social History   Socioeconomic History   . Marital status: Widowed    Spouse name: Not on file  . Number of children: Not on file  . Years of education: Not on file  . Highest education level: Not on file  Occupational History  . Not on file  Tobacco Use  . Smoking status: Never Smoker  . Smokeless tobacco: Never Used  Vaping Use  . Vaping Use: Never used  Substance and Sexual Activity  . Alcohol use: Not Currently  . Drug use: Not Currently  . Sexual activity: Not Currently    Partners: Male  Other Topics Concern  . 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 Difficulty of Paying Living Expenses: Not hard at all  Food Insecurity: No Food Insecurity  . Worried About Charity fundraiser in the Last Year: Never true  . Ran Out of Food in the Last Year: Never true  Transportation Needs: No Transportation Needs  . Lack of Transportation (Medical): No  . Lack of Transportation (Non-Medical): No  Physical Activity: Unknown  . Days of Exercise per Week: 0 days  . Minutes of Exercise per Session: Not on file  Stress: No Stress Concern Present  . Feeling of Stress : Not at all  Social Connections: Unknown  . Frequency of Communication with Friends and Family: More than three times a week  . Frequency of Social Gatherings with Friends and Family: More than three times a week  . Attends Religious Services: Not on file  . Active Member of Clubs or Organizations: Not on file  . Attends Archivist Meetings: Not on file  . Marital Status: Widowed  Intimate Partner Violence: Not At Risk  . Fear of Current or Ex-Partner: No  . Emotionally Abused: No  .  Physically Abused: No  . Sexually Abused: No   Current Meds  Medication Sig  . famotidine (PEPCID) 20 MG tablet Take 1 tablet (20 mg total) by mouth at bedtime.  Marland Kitchen gentamicin cream (GARAMYCIN) 0.1 % Apply 1 application topically 2 (two) times daily.  . memantine (NAMENDA) 5 MG tablet Take 2 tablets (10 mg total) by mouth 2 (two) times daily. Per Dr. Manuella Ghazi neurology  . multivitamin-lutein (OCUVITE-LUTEIN) CAPS capsule Take 1 capsule by mouth every other day.  Marland Kitchen omeprazole (PRILOSEC) 40 MG capsule Take 1 capsule (40 mg total) by mouth daily. 30 minutes before dinner  . triamcinolone cream (KENALOG) 0.1 % Apply 1 application topically 2 (two) times daily. Right upper back as needed   Allergies  Allergen Reactions  . Amoxil [Amoxicillin]   . Tylenol [Acetaminophen]     ?reaction    . Ampicillin Rash  . Elemental Sulfur Rash  . Keflex [Cephalexin] Rash  . Macrobid [Nitrofurantoin] Rash  . Ofloxacin Rash  . Penicillins Rash   Recent Results (from the past 2160 hour(s))  Glucose, capillary     Status: None   Collection Time: 01/06/21 12:56 PM  Result Value Ref Range   Glucose-Capillary 81 70 - 99 mg/dL    Comment: Glucose reference range applies only to samples taken after fasting for at least 8 hours.   Objective  Body mass index is 14.5 kg/m. Wt Readings from Last 3 Encounters:  04/01/21 85 lb 12.8 oz (38.9 kg)  11/23/20 88 lb 12.8 oz (40.3 kg)  10/14/20 87 lb 4.8 oz (39.6 kg)   Temp Readings from Last 3 Encounters:  04/01/21 98.1 F (36.7 C) (Oral)  11/23/20 97.6 F (36.4 C) (Oral)  10/14/20 98.1 F (36.7 C) (Tympanic)   BP Readings from Last 3 Encounters:  04/01/21 104/68  11/23/20 126/80  10/14/20 130/78   Pulse Readings from Last 3 Encounters:  04/01/21 78  11/23/20 65  10/14/20 72    Physical Exam Vitals and nursing note reviewed.  Constitutional:      Appearance: Normal appearance. She is well-developed, well-groomed and underweight.     Comments:  Cachetic    HENT:     Head: Normocephalic and atraumatic.  Cardiovascular:     Rate and Rhythm: Normal rate and regular rhythm.     Heart sounds: Normal heart sounds. No murmur heard.   Skin:    General: Skin is warm and dry.  Neurological:  General: No focal deficit present.     Mental Status: She is alert and oriented to person, place, and time. Mental status is at baseline.     Gait: Gait normal.  Psychiatric:        Attention and Perception: Attention and perception normal.        Mood and Affect: Mood and affect normal.        Speech: Speech normal.        Behavior: Behavior normal. Behavior is cooperative.        Thought Content: Thought content normal.        Cognition and Memory: Cognition and memory normal.        Judgment: Judgment normal.     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 rollator  Rx nutrition at [facility] if they do not have there refer out   HM Flu shotutd pna 23 had10/1/16 prevnarutd  Given Rx shingrixprevRx 2/2 given Tdap had 10/11/12  zostervax had Hep C neg 01/07/19 Hep B not immune, hep A immune 01/07/19 covid vx had 2/43modernadisc moderna booster had utd at [facility] 10/26/2020   Never smoker   No need for pap s/p hysterectomy DUB, endometriosis   Mammogram 01/07/21 NEGATIVE   A1C 5.6 dexa had 11/15/2017 +osteoporosis -2.9 left femur will repeatordered -Pt never took fosamax disc proliaprev.declines 2/2 c/w side effects  Colonoscopy h/o polyp tubular 05/03/15 Had repeat EGD/colonoscopy 06/17/2019 [facility]. 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 Olivia Mackie McLean-Scocuzza-Internal Medicine

## 2021-04-01 NOTE — Patient Instructions (Addendum)
memantine (NAMENDA) 5 MG tablet  Indications: Mild dementia (CMS-HCC) Take 1 tablet (5 mg total) by mouth 2 (two) times daily 60 tablet  11 02/22/2021 02/22/2022   07/19/2021 Office Visit Neurology Evelyn Barker, [address] [place], [place] [address]  [phone] (Work)  [phone] (Fax)    LET ME IF THEY DO NOT [facility]    1. Mild to moderate cognitive impairment concerning for underlying neurodegenerative disorder - mild, late onset dementia   Patient should know about cognition (memory) and "brain health". Physical exercise such as walking, running, swimming, Yoga, Tai Chi, dance and sports should be considered. Mental exercise such as involvement in different hobbies and activities (painting, gardening, reading, interactive computer games and board games), help stimulate brain. Learning new skills and getting involved in mentally challenging tasks, help brain maintain and build cognitive reserve with the use of neuroplasticity. We discussed about "brain healthy food" if appropriate. Address hearing and visual impairments, if applicable.  We will order MRI Brain without contrast  We will order labs to check CBC, CMP, TSH, Vit B12, Vit B1, Vit D, folate, Treponema Pallidum (syphilis) screening cascade.   We will prescribe Namenda 5 mg. Please take 5 mg (1 tablet) at night for 1 week. Then take 5 mg (1 tablet) two times a day  Namenda / memantine is a NMDA receptor antagonist, it works on a neurotransmitter called glutamate, indicated for moderate to severe dementia of Alzheimer's type but can be used for other indications as well. Most common side effects are dizziness, headache, constipation and confusion. It is started as low dose and gradually increased.  Patient should have a formal driving evaluation. Please do not drive until you complete the formal driving evaluation  Many neurological, visual, and musculoskeletal diseases can affect one's  ability to safely operate a motor vehicle. A driving evaluation can help to determine a person's medical fitness to drive, and when appropriate adaptive devices can be installed on the vehicle to improve driving capacity.   [facility] offers a Patent examiner and provides an individualized assessment of driving. Areas of assessment include physical abilities, thinking skills, vision, and reaction times.   Of note, neither the clinical nor the on-road are covered by insurance so please relay this information. $200 for the clinical and starting at an additional $150 for the on-road portion.   For more information please go to [email or web address] or call the appt line at [phone].  [employer] has a good Passenger transport manager program that is appropriate for seniors who want to improve their driving abilities.   Real-world, on-the-road driving evaluation, is also available through private companies such as [place], [place], Phone: [phone], Fax: [phone] ([email or web address]) or Lawanna Kobus at [email or web address] [phone].    Memantine Tablets What is this medicine? MEMANTINE (MEM an teen) is used to treat dementia caused by Alzheimer's disease. This medicine may be used for other purposes; ask your health care provider or pharmacist if you have questions. COMMON BRAND NAME(S): Namenda What should I tell my health care provider before I take this medicine? They need to know if you have any of these conditions:  difficulty passing urine  kidney disease  liver disease  seizures  an unusual or allergic reaction to memantine, other medicines, foods, dyes, or preservatives  pregnant or trying to get pregnant  breast-feeding How should I use this medicine? Take this medicine by mouth with a glass of water. Follow  the directions on the prescription label. You may take this  medicine with or without food. Take your doses at regular intervals. Do not take your medicine more often than directed. Continue to take your medicine even if you feel better. Do not stop taking except on the advice of your doctor or health care professional. Talk to your pediatrician regarding the use of this medicine in children. Special care may be needed. Overdosage: If you think you have taken too much of this medicine contact a poison control center or emergency room at once. NOTE: This medicine is only for you. Do not share this medicine with others. What if I miss a dose? If you miss a dose, take it as soon as you can. If it is almost time for your next dose, take only that dose. Do not take double or extra doses. If you do not take your medicine for several days, contact your health care provider. Your dose may need to be changed. What may interact with this medicine?  acetazolamide  amantadine  cimetidine  dextromethorphan  dofetilide  hydrochlorothiazide  ketamine  metformin  methazolamide  quinidine  ranitidine  sodium bicarbonate  triamterene This list may not describe all possible interactions. Give your health care provider a list of all the medicines, herbs, non-prescription drugs, or dietary supplements you use. Also tell them if you smoke, drink alcohol, or use illegal drugs. Some items may interact with your medicine. What should I watch for while using this medicine? Visit your doctor or health care professional for regular checks on your progress. Check with your doctor or health care professional if there is no improvement in your symptoms or if they get worse. You may get drowsy or dizzy. Do not drive, use machinery, or do anything that needs mental alertness until you know how this drug affects you. Do not stand or sit up quickly, especially if you are an older patient. This reduces the risk of dizzy or fainting spells. Alcohol can make you more drowsy and  dizzy. Avoid alcoholic drinks. What side effects may I notice from receiving this medicine? Side effects that you should report to your doctor or health care professional as soon as possible:  allergic reactions like skin rash, itching or hives, swelling of the face, lips, or tongue  agitation or a feeling of restlessness  depressed mood  dizziness  hallucinations  redness, blistering, peeling or loosening of the skin, including inside the mouth  seizures  vomiting Side effects that usually do not require medical attention (report to your doctor or health care professional if they continue or are bothersome):  constipation  diarrhea  headache  nausea  trouble sleeping This list may not describe all possible side effects. Call your doctor for medical advice about side effects. You may report side effects to FDA at 1-800-FDA-1088. Where should I keep my medicine? Keep out of the reach of children. Store at room temperature between 15 degrees and 30 degrees C (59 degrees and 86 degrees F). Throw away any unused medicine after the expiration date. NOTE: This sheet is a summary. It may not cover all possible information. If you have questions about this medicine, talk to your doctor, pharmacist, or health care provider.  2021 Elsevier/Gold Standard (2013-09-15 14:10:42)

## 2021-04-04 ENCOUNTER — Telehealth: Payer: Self-pay | Admitting: Internal Medicine

## 2021-04-04 DIAGNOSIS — E854 Organ-limited amyloidosis: Secondary | ICD-10-CM | POA: Insufficient documentation

## 2021-04-04 DIAGNOSIS — E440 Moderate protein-calorie malnutrition: Secondary | ICD-10-CM | POA: Insufficient documentation

## 2021-04-04 DIAGNOSIS — R627 Adult failure to thrive: Secondary | ICD-10-CM | POA: Insufficient documentation

## 2021-04-04 DIAGNOSIS — E441 Mild protein-calorie malnutrition: Secondary | ICD-10-CM | POA: Insufficient documentation

## 2021-04-04 DIAGNOSIS — G300 Alzheimer's disease with early onset: Secondary | ICD-10-CM | POA: Insufficient documentation

## 2021-04-04 DIAGNOSIS — F0280 Dementia in other diseases classified elsewhere without behavioral disturbance: Secondary | ICD-10-CM | POA: Insufficient documentation

## 2021-04-04 NOTE — Telephone Encounter (Signed)
Call pt does [facility] have nutrition on site?

## 2021-04-08 ENCOUNTER — Telehealth: Payer: Self-pay | Admitting: Internal Medicine

## 2021-04-08 NOTE — Telephone Encounter (Signed)
Spoke with Patient, She states she forgot to ask and will call our office back with an answer. Patient wrote down our office number to call back.

## 2021-04-08 NOTE — Telephone Encounter (Signed)
Occupational therapy OT Evaluation & plan of care faxed and sign on 04-06-21  To [place] [phone]

## 2021-04-29 ENCOUNTER — Telehealth: Payer: Self-pay | Admitting: Internal Medicine

## 2021-04-29 NOTE — Telephone Encounter (Signed)
Spoke with Dr Olivia Mackie McLean-Scocuzza and she states that the Patient has been sent to Neurology and diagnosed with dementia.   States she is agreeable to Patient no longer driving and will sign FL2 if Patient agrees to the move to assisted living.   Wants to know if [facility] has nutrition on site to help with Patient's weight loss as this has been an ongoing thing for the Patient?   Tried Civil engineer, contracting back and line was busy. No answer, no voicemail.

## 2021-04-29 NOTE — Telephone Encounter (Signed)
Patient had OT evaluation. They states that the patient did not do well. She is no longer cleared to drive and states that her scores are leaning towards a dementia diagnosis. They will fax over the evaluation.   States they are taking the Patient on a tour of the assisted living facility and the Patient has agreed to give up driving. If Patient agrees they will fax over FL2 to sign for transfer from independent living to assisted.   Also states Patient is loosing weight.

## 2021-04-29 NOTE — Telephone Encounter (Signed)
Patient returned office phone call. 

## 2021-05-06 NOTE — Telephone Encounter (Signed)
Patient states her weight is now 87.8 pounds and she is concerned.   Patient scheduled for 05/10/21 appointment

## 2021-05-06 NOTE — Telephone Encounter (Signed)
They do not think she is safe to live at home due to memory issues I believe which is why they recommended assisted living   We have worked up weight loss and no cause Gi or internal could be memory and she is established with neurology  It could be anxiety  Can you call nutrition elizabeth brown and see if she can  A) fax me notes from her meeting with her B) schedule another follow up   Can you contact social worker [facility] and see if she can help [phone]

## 2021-05-06 NOTE — Telephone Encounter (Signed)
Patient states she is still loosing weight. States that [facility] has a Automotive engineer, Nolon Nations. Patient has only seen her once so far.   Patient declined assisted living

## 2021-05-07 ENCOUNTER — Other Ambulatory Visit: Payer: Self-pay

## 2021-05-07 ENCOUNTER — Emergency Department
Admission: EM | Admit: 2021-05-07 | Discharge: 2021-05-07 | Disposition: A | Discharge status: Home / Self Care | Payer: Medicare Other | Attending: Emergency Medicine | Admitting: Emergency Medicine

## 2021-05-07 DIAGNOSIS — R457 State of emotional shock and stress, unspecified: Secondary | ICD-10-CM | POA: Diagnosis not present

## 2021-05-07 DIAGNOSIS — I10 Essential (primary) hypertension: Secondary | ICD-10-CM | POA: Diagnosis not present

## 2021-05-07 DIAGNOSIS — R079 Chest pain, unspecified: Secondary | ICD-10-CM | POA: Diagnosis not present

## 2021-05-07 DIAGNOSIS — R0789 Other chest pain: Secondary | ICD-10-CM | POA: Insufficient documentation

## 2021-05-07 LAB — COMPREHENSIVE METABOLIC PANEL
ALT: 21 U/L (ref 0–44)
AST: 24 U/L (ref 15–41)
Albumin: 4.2 g/dL (ref 3.5–5.0)
Alkaline Phosphatase: 44 U/L (ref 38–126)
Anion gap: 7 (ref 5–15)
BUN: 29 mg/dL — ABNORMAL HIGH (ref 8–23)
CO2: 29 mmol/L (ref 22–32)
Calcium: 9.5 mg/dL (ref 8.9–10.3)
Chloride: 103 mmol/L (ref 98–111)
Creatinine, Ser: 0.75 mg/dL (ref 0.44–1.00)
GFR, Estimated: >60 mL/min (ref >60)
Glucose, Bld: 95 mg/dL (ref 70–99)
Potassium: 4.1 mmol/L (ref 3.5–5.1)
Sodium: 139 mmol/L (ref 135–145)
Total Bilirubin: 1.2 mg/dL (ref 0.3–1.2)
Total Protein: 6.6 g/dL (ref 6.5–8.1)

## 2021-05-07 LAB — CBC
HCT: 41.4 % (ref 36.0–46.0)
Hemoglobin: 14.3 g/dL (ref 12.0–15.0)
MCH: 33.6 pg (ref 26.0–34.0)
MCHC: 34.5 g/dL (ref 30.0–36.0)
MCV: 97.4 fL (ref 80.0–100.0)
Platelets: 211 10*3/uL (ref 150–400)
RBC: 4.25 MIL/uL (ref 3.87–5.11)
RDW: 12.6 % (ref 11.5–15.5)
WBC: 5.7 10*3/uL (ref 4.0–10.5)
nRBC: 0 % (ref 0.0–0.2)

## 2021-05-07 LAB — TROPONIN I (HIGH SENSITIVITY): Troponin I (High Sensitivity): 3 ng/L (ref <18)

## 2021-05-07 NOTE — ED Provider Notes (Signed)
[facility] Emergency Department Provider Note   ____________________________________________    I have reviewed the triage vital signs and the nursing notes.   HISTORY  Chief Complaint Chest Pain     HPI Evelyn Barker is a 75 y.o. female who presents for primary concern of weight loss as well as some chest discomfort.  Patient reports she is quite concerned that she has been losing weight over the last year.  This morning she weighed herself and found that she weighed approximately 88 pounds and she became very concerned and anxious and felt a tightness in her chest as she decided to come to the emergency department.  Review of medical records demonstrates the patient has had this weight loss worked up significantly by PCP.  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  Patient reports she has no chest pain now, no shortness of breath.  No fevers or chills or nausea or vomiting.  Currently she is at her baseline  Past Medical History:  Diagnosis Date  . Anxiety    knees, low back   . Arthritis   . History of chicken pox   . History of macular degeneration   . History of shingles    x3   . Osteoporosis   . Osteoporosis    declines medication   . 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 Cerebral amyloid angiopathy ([place]) 04/04/2021  . Moderate protein-calorie malnutrition ([place]) 04/04/2021  . Early onset Alzheimer's dementia without behavioral disturbance ([place]) 04/04/2021  . Failure to thrive in adult 04/04/2021  . Underweight 04/01/2021  . Abnormal gait 04/01/2021  . Aortic atherosclerosis ([place]) 11/23/2020  . 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 Pancreatic mass 05/25/2020  . Constipation 11/21/2019  . Gastroesophageal reflux disease 07/28/2019  . Pancreatic cyst 05/23/2019  . Fatty liver 12/27/2018  . Rosacea 11/19/2018  . Osteoporosis  10/16/2018  . Knee pain 10/16/2018  . Right hip pain 10/16/2018  . Weight loss 10/16/2018    Past Surgical History:  Procedure Laterality Date  . ABDOMINAL HYSTERECTOMY     DUB, endometriosis no h/o abnormal pap   . APPENDECTOMY    . COLONOSCOPY N/A 04/30/2015   Procedure: COLONOSCOPY;  Surgeon: Josefine Class, MD;  [facility] ENDOSCOPY;  Service: Endoscopy;  Laterality: N/A;  . TONSILLECTOMY      Prior to Admission medications   Medication Sig Start Date End Date Taking? Authorizing Provider  ALPRAZolam (XANAX) 0.25 MG tablet Take 0.5-1 tablets (0.125-0.25 mg total) by mouth at bedtime as needed for anxiety. Patient not taking: No sig reported 11/23/20   McLean-Scocuzza, Nino Glow, MD  famotidine (PEPCID) 20 MG tablet Take 1 tablet (20 mg total) by mouth at bedtime. 03/03/20   McLean-Scocuzza, Nino Glow, MD  gentamicin cream (GARAMYCIN) 0.1 % Apply 1 application topically 2 (two) times daily. 06/25/20   Edrick Kins, DPM  memantine (NAMENDA) 5 MG tablet Take 2 tablets (10 mg total) by mouth 2 (two) times daily. Per Dr. Manuella Ghazi neurology    [provider]  multivitamin-lutein [facility]) CAPS capsule Take 1 capsule by mouth every other day.    [provider]  omeprazole (PRILOSEC) 40 MG capsule Take 1 capsule (40 mg total) by mouth daily. 30 minutes before dinner 11/23/20   McLean-Scocuzza, Nino Glow, MD  triamcinolone cream (KENALOG) 0.1 % Apply 1 application topically 2 (  two) times daily. Right upper back as needed 03/03/20   McLean-Scocuzza, Nino Glow, MD     Allergies Amoxil [amoxicillin], Tylenol [acetaminophen], Ampicillin, Elemental sulfur, Keflex [cephalexin], Macrobid [nitrofurantoin], Ofloxacin, and Penicillins  Family History  Problem Relation Age of Onset  . Stroke Mother   . Cancer Father        prostate   . Pneumonia Maternal Grandmother   . Hip fracture Maternal Grandmother   . Stroke Paternal Grandfather   . Skin cancer Other   . Breast  cancer Neg Hx     Social History Social History   Tobacco Use  . Smoking status: Never Smoker  . Smokeless tobacco: Never Used  Vaping Use  . Vaping Use: Never used  Substance Use Topics  . Alcohol use: Not Currently  . Drug use: Not Currently    Review of Systems  Constitutional: No fever/chills Eyes: No visual changes.  ENT: No sore throat. Cardiovascular: As above Respiratory: Denies shortness of breath. Gastrointestinal: No abdominal pain.   Genitourinary: Negative for dysuria. Musculoskeletal: Negative for back pain. Skin: Negative for rash. Neurological: Negative for headaches    ____________________________________________   PHYSICAL EXAM:  VITAL SIGNS: ED Triage Vitals  Enc Vitals Group     BP 05/07/21 1113 (!) 168/81     Pulse Rate 05/07/21 1113 71     Resp 05/07/21 1113 18     Temp 05/07/21 1113 98.3 F (36.8 C)     Temp Source 05/07/21 1113 Oral     SpO2 05/07/21 1113 98 %     Weight 05/07/21 1111 39.9 kg (88 lb)     Height 05/07/21 1111 1.626 m (5\' 4" )     Head Circumference --      Peak Flow --      Pain Score 05/07/21 1111 0     Pain Loc --      Pain Edu? --      Excl. in [place]? --     Constitutional: Alert and oriented.   Nose: No congestion/rhinnorhea. Mouth/Throat: Mucous membranes are moist.    Cardiovascular: Normal rate, regular rhythm. Grossly normal heart sounds.  Good peripheral circulation. Respiratory: Normal respiratory effort.  No retractions. Lungs CTAB. Gastrointestinal: Soft and nontender. No distention.  No CVA tenderness.  Musculoskeletal: No lower extremity tenderness nor edema.  Warm and well perfused Neurologic:  Normal speech and language. No gross focal neurologic deficits are appreciated.  Skin:  Skin is warm, dry and intact. No rash noted. Psychiatric: Mood and affect are normal. Speech and behavior are normal.  ____________________________________________   LABS (all labs ordered are listed, but only abnormal  results are displayed)  Labs Reviewed  COMPREHENSIVE METABOLIC PANEL - Abnormal; Notable for the following components:      Result Value   BUN 29 (*)    All other components within normal limits  CBC  URINALYSIS, COMPLETE (UACMP) WITH MICROSCOPIC  CBG MONITORING, ED  TROPONIN I (HIGH SENSITIVITY)   ____________________________________________  EKG  ED ECG REPORT I, Lavonia Drafts, the attending physician, personally viewed and interpreted this ECG.  Date: 05/07/2021  Rhythm: normal sinus rhythm QRS Axis: normal Intervals: normal ST/T Wave abnormalities: Nonspecific changes Narrative Interpretation: no evidence of acute ischemia  ____________________________________________  RADIOLOGY  None ____________________________________________   PROCEDURES  Procedure(s) performed: No  Procedures   Critical Care performed: No ____________________________________________   INITIAL IMPRESSION / ASSESSMENT AND PLAN / ED COURSE  Pertinent labs & imaging results that were available during my care of  the patient were reviewed by me and considered in my medical decision making (see chart for details).  Patient well-appearing and in no acute distress.  She is asymptomatic at this time.  She thinks that her chest discomfort was related to anxiety.  EKG is reassuring.  We will send high-sensitivity troponin although I strongly doubt ACS.  Troponin is normal.  Patient is asymptomatic.  No indication for further work-up at this time appropriate for discharge she has outpatient follow-up with her PCP    ____________________________________________   FINAL CLINICAL IMPRESSION(S) / ED DIAGNOSES  Final diagnoses:  Atypical chest pain        Note:  This document was prepared using Dragon voice recognition software and may include unintentional dictation errors.   Lavonia Drafts, MD 05/07/21 [phone]

## 2021-05-07 NOTE — ED Triage Notes (Signed)
Pt states she is here because she has had rapid weight loss and increased weakness- pt states she was having some chest pressure but it is gone now, pt states this happens when she is anxious

## 2021-05-07 NOTE — ED Notes (Signed)
Patients friend Barnetta Chapel coming to pick up patient, attempted to call [facility], no answer at this time. Patient ambulatory with steady gait.

## 2021-05-07 NOTE — ED Triage Notes (Signed)
Pt in via EMS from home with c/o cp that is pressure like and some intermittent dizziness over the last several months. 194/96 BP, reassess 176/86. Pt gets dizzy when she stands. Pt also with rapid weight loss. Pain is non radiating, CBG 130.

## 2021-05-10 ENCOUNTER — Ambulatory Visit (INDEPENDENT_AMBULATORY_CARE_PROVIDER_SITE_OTHER): Payer: Medicare Other | Admitting: Internal Medicine

## 2021-05-10 ENCOUNTER — Encounter: Payer: Self-pay | Admitting: Internal Medicine

## 2021-05-10 ENCOUNTER — Other Ambulatory Visit: Payer: Self-pay

## 2021-05-10 VITALS — BP 106/70 | HR 75 | Temp 98.0°F | Ht 64.0 in | Wt 88.6 lb

## 2021-05-10 DIAGNOSIS — F0280 Dementia in other diseases classified elsewhere without behavioral disturbance: Secondary | ICD-10-CM

## 2021-05-10 DIAGNOSIS — G301 Alzheimer's disease with late onset: Secondary | ICD-10-CM | POA: Diagnosis not present

## 2021-05-10 DIAGNOSIS — R634 Abnormal weight loss: Secondary | ICD-10-CM

## 2021-05-10 DIAGNOSIS — K2970 Gastritis, unspecified, without bleeding: Secondary | ICD-10-CM | POA: Diagnosis not present

## 2021-05-10 DIAGNOSIS — L299 Pruritus, unspecified: Secondary | ICD-10-CM | POA: Diagnosis not present

## 2021-05-10 DIAGNOSIS — F419 Anxiety disorder, unspecified: Secondary | ICD-10-CM

## 2021-05-10 MED ORDER — MEMANTINE HCL 5 MG PO TABS: ORAL_TABLET | ORAL | 3 refills | Status: DC

## 2021-05-10 MED ORDER — MIRTAZAPINE 7.5 MG PO TABS: 7.5000 mg | ORAL_TABLET | Freq: Every day | ORAL | 3 refills | Status: DC

## 2021-05-10 MED ORDER — TRIAMCINOLONE ACETONIDE 0.1 % EX CREA: 1.0000 "application " | TOPICAL_CREAM | Freq: Two times a day (BID) | CUTANEOUS | 5 refills | Status: DC

## 2021-05-10 MED ORDER — OMEPRAZOLE 40 MG PO CPDR: 40.0000 mg | DELAYED_RELEASE_CAPSULE | Freq: Every day | ORAL | 3 refills | Status: DC

## 2021-05-10 NOTE — Patient Instructions (Addendum)
07/19/2021 Office Visit Neurology Evelyn Barker, [address], [place] [address]  [phone] (Work)  [phone] (Fax)    Will move appointment up Call back with [place] phone #please   Memantine Tablets What is this medicine? MEMANTINE (MEM an teen) is used to treat dementia caused by Alzheimer's disease. This medicine may be used for other purposes; ask your health care provider or pharmacist if you have questions. COMMON BRAND NAME(S): Namenda What should I tell my health care provider before I take this medicine? They need to know if you have any of these conditions:  difficulty passing urine  kidney disease  liver disease  seizures  an unusual or allergic reaction to memantine, other medicines, foods, dyes, or preservatives  pregnant or trying to get pregnant  breast-feeding How should I use this medicine? Take this medicine by mouth with a glass of water. Follow the directions on the prescription label. You may take this medicine with or without food. Take your doses at regular intervals. Do not take your medicine more often than directed. Continue to take your medicine even if you feel better. Do not stop taking except on the advice of your doctor or health care professional. Talk to your pediatrician regarding the use of this medicine in children. Special care may be needed. Overdosage: If you think you have taken too much of this medicine contact a poison control center or emergency room at once. NOTE: This medicine is only for you. Do not share this medicine with others. What if I miss a dose? If you miss a dose, take it as soon as you can. If it is almost time for your next dose, take only that dose. Do not take double or extra doses. If you do not take your medicine for several days, contact your health care provider. Your dose may need to be changed. What may interact with this  medicine?  acetazolamide  amantadine  cimetidine  dextromethorphan  dofetilide  hydrochlorothiazide  ketamine  metformin  methazolamide  quinidine  ranitidine  sodium bicarbonate  triamterene This list may not describe all possible interactions. Give your health care provider a list of all the medicines, herbs, non-prescription drugs, or dietary supplements you use. Also tell them if you smoke, drink alcohol, or use illegal drugs. Some items may interact with your medicine. What should I watch for while using this medicine? Visit your doctor or health care professional for regular checks on your progress. Check with your doctor or health care professional if there is no improvement in your symptoms or if they get worse. You may get drowsy or dizzy. Do not drive, use machinery, or do anything that needs mental alertness until you know how this drug affects you. Do not stand or sit up quickly, especially if you are an older patient. This reduces the risk of dizzy or fainting spells. Alcohol can make you more drowsy and dizzy. Avoid alcoholic drinks. What side effects may I notice from receiving this medicine? Side effects that you should report to your doctor or health care professional as soon as possible:  allergic reactions like skin rash, itching or hives, swelling of the face, lips, or tongue  agitation or a feeling of restlessness  depressed mood  dizziness  hallucinations  redness, blistering, peeling or loosening of the skin, including inside the mouth  seizures  vomiting Side effects that usually do not require medical attention (report to your doctor or health care professional  if they continue or are bothersome):  constipation  diarrhea  headache  nausea  trouble sleeping This list may not describe all possible side effects. Call your doctor for medical advice about side effects. You may report side effects to FDA at 1-800-FDA-1088. Where should I  keep my medicine? Keep out of the reach of children. Store at room temperature between 15 degrees and 30 degrees C (59 degrees and 86 degrees F). Throw away any unused medicine after the expiration date. NOTE: This sheet is a summary. It may not cover all possible information. If you have questions about this medicine, talk to your doctor, pharmacist, or health care provider.  2021 Elsevier/Gold Standard (2013-09-15 14:10:42)  Mirtazapine tablets What is this medicine? MIRTAZAPINE (mir TAZ a peen) is used to treat depression. This medicine may be used for other purposes; ask your health care provider or pharmacist if you have questions. COMMON BRAND NAME(S): Remeron What should I tell my health care provider before I take this medicine? They need to know if you have any of these conditions:  bipolar disorder  glaucoma  kidney disease  liver disease  suicidal thoughts  an unusual or allergic reaction to mirtazapine, other medicines, foods, dyes, or preservatives  pregnant or trying to get pregnant  breast-feeding How should I use this medicine? Take this medicine by mouth with a glass of water. Follow the directions on the prescription label. Take your medicine at regular intervals. Do not take your medicine more often than directed. Do not stop taking this medicine suddenly except upon the advice of your doctor. Stopping this medicine too quickly may cause serious side effects or your condition may worsen. A special MedGuide will be given to you by the pharmacist with each prescription and refill. Be sure to read this information carefully each time. Talk to your pediatrician regarding the use of this medicine in children. Special care may be needed. Overdosage: If you think you have taken too much of this medicine contact a poison control center or emergency room at once. NOTE: This medicine is only for you. Do not share this medicine with others. What if I miss a dose? If you  miss a dose, take it as soon as you can. If it is almost time for your next dose, take only that dose. Do not take double or extra doses. What may interact with this medicine? Do not take this medicine with any of the following medications:  linezolid  MAOIs like Carbex, Eldepryl, Marplan, Nardil, and Parnate  methylene blue (injected into a vein) This medicine may also interact with the following medications:  alcohol  antiviral medicines for HIV or AIDS  certain medicines that treat or prevent blood clots like warfarin  certain medicines for depression, anxiety, or psychotic disturbances  certain medicines for fungal infections like ketoconazole and itraconazole  certain medicines for migraine headache like almotriptan, eletriptan, frovatriptan, naratriptan, rizatriptan, sumatriptan, zolmitriptan  certain medicines for seizures like carbamazepine or phenytoin  certain medicines for sleep  cimetidine  erythromycin  fentanyl  lithium  medicines for blood pressure  nefazodone  rasagiline  rifampin  supplements like St. John's wort, kava kava, valerian  tramadol  tryptophan This list may not describe all possible interactions. Give your health care provider a list of all the medicines, herbs, non-prescription drugs, or dietary supplements you use. Also tell them if you smoke, drink alcohol, or use illegal drugs. Some items may interact with your medicine. What should I watch for while using  this medicine? Tell your doctor if your symptoms do not get better or if they get worse. Visit your doctor or health care professional for regular checks on your progress. Because it may take several weeks to see the full effects of this medicine, it is important to continue your treatment as prescribed by your doctor. Patients and their families should watch out for new or worsening thoughts of suicide or depression. Also watch out for sudden changes in feelings such as feeling  anxious, agitated, panicky, irritable, hostile, aggressive, impulsive, severely restless, overly excited and hyperactive, or not being able to sleep. If this happens, especially at the beginning of treatment or after a change in dose, call your health care professional. Dennis Bast may get drowsy or dizzy. Do not drive, use machinery, or do anything that needs mental alertness until you know how this medicine affects you. Do not stand or sit up quickly, especially if you are an older patient. This reduces the risk of dizzy or fainting spells. Alcohol may interfere with the effect of this medicine. Avoid alcoholic drinks. This medicine may cause dry eyes and blurred vision. If you wear contact lenses you may feel some discomfort. Lubricating drops may help. See your eye doctor if the problem does not go away or is severe. Your mouth may get dry. Chewing sugarless gum or sucking hard candy, and drinking plenty of water may help. Contact your doctor if the problem does not go away or is severe. What side effects may I notice from receiving this medicine? Side effects that you should report to your doctor or health care professional as soon as possible:  allergic reactions like skin rash, itching or hives, swelling of the face, lips, or tongue  anxious  changes in vision  chest pain  confusion  elevated mood, decreased need for sleep, racing thoughts, impulsive behavior  eye pain  fast, irregular heartbeat  feeling faint or lightheaded, falls  feeling agitated, angry, or irritable  fever or chills, sore throat  hallucination, loss of contact with reality  loss of balance or coordination  mouth sores  redness, blistering, peeling or loosening of the skin, including inside the mouth  restlessness, pacing, inability to keep still  seizures  stiff muscles  suicidal thoughts or other mood changes  trouble passing urine or change in the amount of urine  trouble sleeping  unusual  bleeding or bruising  unusually weak or tired  vomiting Side effects that usually do not require medical attention (report to your doctor or health care professional if they continue or are bothersome):  change in appetite  constipation  dizziness  dry mouth  muscle aches or pains  nausea  tired  weight gain This list may not describe all possible side effects. Call your doctor for medical advice about side effects. You may report side effects to FDA at 1-800-FDA-1088. Where should I keep my medicine? Keep out of the reach of children. Store at room temperature between 15 and 30 degrees C (59 and 86 degrees F) Protect from light and moisture. Throw away any unused medicine after the expiration date. NOTE: This sheet is a summary. It may not cover all possible information. If you have questions about this medicine, talk to your doctor, pharmacist, or health care provider.  2021 Elsevier/Gold Standard (2016-04-27 17:30:45)   Memory Compensation Strategies  4. Use "WARM" strategy.  W= write it down  A= associate it  R= repeat it  M= make a mental note  2.  You can keep a Social worker.  Use a 3-ring notebook with sections for the following: calendar, important names and phone numbers,  medications, doctors' names/phone numbers, lists/reminders, and a section to journal what you did  each day.   3.    Use a calendar to write appointments down.  4.    Write yourself a schedule for the day.  This can be placed on the calendar or in a separate section of the Memory Notebook.  Keeping a  regular schedule can help memory.  5.    Use medication organizer with sections for each day or morning/evening pills.  You may need help loading it  6.    Keep a basket, or pegboard by the door.  Place items that you need to take out with you in the basket or on the pegboard.  You may also want to  include a message board for reminders.  7.    Use sticky notes.  Place sticky notes  with reminders in a place where the task is performed.  For example: " turn off the  stove" placed by the stove, "lock the door" placed on the door at eye level, " take your medications" on  the bathroom mirror or by the place where you normally take your medications.  8.    Use alarms/timers.  Use while cooking to remind yourself to check on food or as a reminder to take your medicine, or as a  reminder to make a call, or as a reminder to perform another task, etc.

## 2021-05-10 NOTE — Telephone Encounter (Signed)
Patient seen in office today to discuss

## 2021-05-10 NOTE — Progress Notes (Signed)
Chief Complaint  Patient presents with  . Weight Loss   F/u with friend Marg  1. Dementia She failed ot driving test and license taken away due to c/w dementia as of today 05/10/21 not taking namenda 5 mg qd x 1 week then bid  [facility] 07/19/21 appt f/u but will have her see sooner for decision making capacity  Call sharice wolff [phone] they are trying to transition to [facility] pt at [facility] she is not sure if wants to go Her friend asks about [facility]    2. Weight loss down she states she eats and friend notes she eats 2 pets scans negative GI upper endo/colonoscopy negative and no b/w cause wt loss mammogram 01/07/21 negative. She does have anxiety and dementia which we disc could be causing wt loss She met with nutrition at [facility] x 1 elizabeth   Review of Systems  Constitutional: Positive for weight loss.  HENT: Negative for hearing loss.   Eyes: Negative for blurred vision.  Respiratory: Negative for shortness of breath.   Cardiovascular: Negative for chest pain.  Gastrointestinal: Negative for abdominal pain.  Musculoskeletal: Negative for falls and joint pain.  Skin: Negative for rash.  Psychiatric/Behavioral: Positive for memory loss. The patient is nervous/anxious.    Past Medical History:  Diagnosis Date  . Anxiety    knees, low back   . Arthritis   . History of chicken pox   . History of macular degeneration   . History of shingles    x3   . Osteoporosis   . Osteoporosis    declines medication   . 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 ABDOMINAL HYSTERECTOMY     DUB, endometriosis no h/o abnormal pap   . APPENDECTOMY    . COLONOSCOPY N/A 04/30/2015   Procedure: COLONOSCOPY;  Surgeon: Josefine Class, MD;  Location: [facility] ENDOSCOPY;  Service: Endoscopy;  Laterality: N/A;  . TONSILLECTOMY     Family History  Problem Relation Age of Onset  . Stroke Mother   . Cancer  Father        prostate   . Pneumonia Maternal Grandmother   . Hip fracture Maternal Grandmother   . Stroke Paternal Grandfather   . Skin cancer Other   . Breast cancer Neg Hx    Social History   Socioeconomic History  . Marital status: Widowed    Spouse name: Not on file  . Number of children: Not on file  . Years of education: Not on file  . Highest education level: Not on file  Occupational History  . Not on file  Tobacco Use  . Smoking status: Never Smoker  . Smokeless tobacco: Never Used  Vaping Use  . Vaping Use: Never used  Substance and Sexual Activity  . Alcohol use: Not Currently  . Drug use: Not Currently  . Sexual activity: Not Currently    Partners: Male  Other Topics Concern  . Not on file  Social History Narrative   Widowed    Grew up in [place] moved around when husband living    No kids    Lives [facility]    Never smoker    Likes to pain for [place]    Enjoys plays    No guns    Wears seat belt, safe in relationship   Retired Architectural technologist  Contact [phone]    Social Determinants of Health   Financial Resource Strain: Low Risk   . Difficulty of Paying Living Expenses: Not hard at all  Food Insecurity: No Food Insecurity  . Worried About Charity fundraiser in the Last Year: Never true  . Ran Out of Food in the Last Year: Never true  Transportation Needs: No Transportation Needs  . Lack of Transportation (Medical): No  . Lack of Transportation (Non-Medical): No  Physical Activity: Unknown  . Days of Exercise per Week: 0 days  . Minutes of Exercise per Session: Not on file  Stress: No Stress Concern Present  . Feeling of Stress : Not at all  Social Connections: Unknown  . Frequency of Communication with Friends and Family: More than three times a week  . Frequency of Social Gatherings with Friends and Family: More than three times a week  . Attends Religious Services: Not on file  . Active Member of Clubs or  Organizations: Not on file  . Attends Archivist Meetings: Not on file  . Marital Status: Widowed  Intimate Partner Violence: Not At Risk  . Fear of Current or Ex-Partner: No  . Emotionally Abused: No  . Physically Abused: No  . Sexually Abused: No   Current Meds  Medication Sig  . ALPRAZolam (XANAX) 0.25 MG tablet Take 0.5-1 tablets (0.125-0.25 mg total) by mouth at bedtime as needed for anxiety.  Marland Kitchen gentamicin cream (GARAMYCIN) 0.1 % Apply 1 application topically 2 (two) times daily.  . mirtazapine (REMERON) 7.5 MG tablet Take 1 tablet (7.5 mg total) by mouth at bedtime.  . multivitamin-lutein (OCUVITE-LUTEIN) CAPS capsule Take 1 capsule by mouth every other day.  . [DISCONTINUED] omeprazole (PRILOSEC) 40 MG capsule Take 1 capsule (40 mg total) by mouth daily. 30 minutes before dinner  . [DISCONTINUED] triamcinolone cream (KENALOG) 0.1 % Apply 1 application topically 2 (two) times daily. Right upper back as needed   Allergies  Allergen Reactions  . Amoxil [Amoxicillin]   . Tylenol [Acetaminophen]     ?reaction    . Ampicillin Rash  . Elemental Sulfur Rash  . Keflex [Cephalexin] Rash  . Macrobid [Nitrofurantoin] Rash  . Ofloxacin Rash  . Penicillins Rash   Recent Results (from the past 2160 hour(s))  CBC     Status: None   Collection Time: 05/07/21 11:15 AM  Result Value Ref Range   WBC 5.7 4.0 - 10.5 K/uL   RBC 4.25 3.87 - 5.11 MIL/uL   Hemoglobin 14.3 12.0 - 15.0 g/dL   HCT 41.4 36.0 - 46.0 %   MCV 97.4 80.0 - 100.0 fL   MCH 33.6 26.0 - 34.0 pg   MCHC 34.5 30.0 - 36.0 g/dL   RDW 12.6 11.5 - 15.5 %   Platelets 211 150 - 400 K/uL   nRBC 0.0 0.0 - 0.2 %    Comment: Performed at [facility], [address] [place], [place] [address]  Comprehensive metabolic panel     Status: Abnormal   Collection Time: 05/07/21 11:15 AM  Result Value Ref Range   Sodium 139 135 - 145 mmol/L   Potassium 4.1 3.5 - 5.1 mmol/L   Chloride 103 98 - 111 mmol/L   CO2 29  22 - 32 mmol/L   Glucose, Bld 95 70 - 99 mg/dL    Comment: Glucose reference range applies only to samples taken after fasting for at least 8 hours.   BUN 29 (H) 8 - 23  mg/dL   Creatinine, Ser 0.75 0.44 - 1.00 mg/dL   Calcium 9.5 8.9 - 10.3 mg/dL   Total Protein 6.6 6.5 - 8.1 g/dL   Albumin 4.2 3.5 - 5.0 g/dL   AST 24 15 - 41 U/L   ALT 21 0 - 44 U/L   Alkaline Phosphatase 44 38 - 126 U/L   Total Bilirubin 1.2 0.3 - 1.2 mg/dL   GFR, Estimated >60 >60 mL/min    Comment: (NOTE) Calculated using the CKD-EPI Creatinine Equation (2021)    Anion gap 7 5 - 15    Comment: Performed at [facility], [place], [place] [address]  Troponin I (High Sensitivity)     Status: None   Collection Time: 05/07/21 11:15 AM  Result Value Ref Range   Troponin I (High Sensitivity) 3 <18 ng/L    Comment: (NOTE) Elevated high sensitivity troponin I (hsTnI) values and significant  changes across serial measurements may suggest ACS but many other  chronic and acute conditions are known to elevate hsTnI results.  Refer to the "Links" section for chest pain algorithms and additional  guidance. Performed at [facility], [address] [place], [place] [address]    Objective  Body mass index is 15.21 kg/m. Wt Readings from Last 3 Encounters:  05/10/21 88 lb 9.6 oz (40.2 kg)  05/07/21 88 lb (39.9 kg)  04/01/21 85 lb 12.8 oz (38.9 kg)   Temp Readings from Last 3 Encounters:  05/10/21 98 F (36.7 C) (Oral)  05/07/21 98.3 F (36.8 C) (Oral)  04/01/21 98.1 F (36.7 C) (Oral)   BP Readings from Last 3 Encounters:  05/10/21 106/70  05/07/21 (!) 172/86  04/01/21 104/68   Pulse Readings from Last 3 Encounters:  05/10/21 75  05/07/21 62  04/01/21 78    Physical Exam Vitals and nursing note reviewed.  Constitutional:      Appearance: Normal appearance.  HENT:     Head: Normocephalic and atraumatic.  Eyes:     Conjunctiva/sclera: Conjunctivae normal.      Pupils: Pupils are equal, round, and reactive to light.  Cardiovascular:     Rate and Rhythm: Normal rate and regular rhythm.     Heart sounds: Normal heart sounds. No murmur heard.   Skin:    General: Skin is warm and dry.  Neurological:     General: No focal deficit present.     Mental Status: She is alert and oriented to person, place, and time. Mental status is at baseline.     Gait: Gait normal.  Psychiatric:        Attention and Perception: Attention and perception normal.        Mood and Affect: Mood and affect normal.        Speech: Speech normal.        Behavior: Behavior normal.        Thought Content: Thought content normal.        Cognition and Memory: Cognition normal.        Judgment: Judgment normal.     Assessment  Plan  Late onset Alzheimer's dementia without behavioral disturbance (HCC)/anxiety - Plan: mirtazapine (REMERON) 7.5 MG tablet to help with weight gain See if [facility] neurology can f/u sooner 07/19/21 than for see hpi  namenda 5 mg qd x 1 week increase to bid  See social worker contact above  Dementia She failed ot driving test and license taken away due to c/w dementia as of today 05/10/21  not taking namenda 5 mg qd x 1 week then bid  [facility] 07/19/21 appt f/u but will have her see sooner for decision making capacity  Call sharice wolff [phone] they are trying to transition to [facility] pt at [facility] she is not sure if wants to go Her friend asks about [facility]    Weight loss could be due to dementia/anxiety - Plan: mirtazapine (REMERON) 7.5 MG tablet Hold prn xanax 0.25 due to memory loss   Gastritis without bleeding, unspecified chronicity, unspecified gastritis type - Plan: omeprazole (PRILOSEC) 40 MG capsule Not taking pepcid 20 mg qd   Itching RUB - Plan: triamcinolone cream (KENALOG) 0.1 %  HM Flu shotutd pna 23 had10/1/16 prevnarutd  Given Rx shingrixprevRx 2/2 given Tdap had 10/11/12  zostervax had Hep C neg  01/07/19 Hep B not immune, hep A immune 01/07/19 covid vx had 3/3  Never smoker   No need for pap s/p hysterectomy DUB, endometriosis   Mammogram 01/07/21 NEGATIVE   A1C 5.6 dexa had 11/15/2017 +osteoporosis -2.9 left femur will repeatordered -Pt never took fosamax disc proliaprev.declines 2/2 c/w side effects  Colonoscopy h/o polyp tubular 05/03/15 Had repeat EGD/colonoscopy 06/17/2019 [facility]. 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 Olivia Mackie McLean-Scocuzza-Internal Medicine

## 2021-05-11 ENCOUNTER — Telehealth: Payer: Self-pay

## 2021-05-11 NOTE — Telephone Encounter (Signed)
Faxed a note from Dr [facility] requesting an earlier appt- to Dr Manuella Ghazi at 05/11/21 at [phone]  Pt needs needs decision making capacity. Lives independently and failed driving test, etc.

## 2021-05-16 DIAGNOSIS — F0280 Dementia in other diseases classified elsewhere without behavioral disturbance: Secondary | ICD-10-CM | POA: Diagnosis not present

## 2021-05-16 DIAGNOSIS — F0150 Vascular dementia without behavioral disturbance: Secondary | ICD-10-CM | POA: Insufficient documentation

## 2021-05-16 DIAGNOSIS — G309 Alzheimer's disease, unspecified: Secondary | ICD-10-CM | POA: Diagnosis not present

## 2021-05-16 DIAGNOSIS — E519 Thiamine deficiency, unspecified: Secondary | ICD-10-CM | POA: Diagnosis not present

## 2021-05-16 DIAGNOSIS — E538 Deficiency of other specified B group vitamins: Secondary | ICD-10-CM | POA: Diagnosis not present

## 2021-06-08 ENCOUNTER — Telehealth: Payer: Self-pay | Admitting: Internal Medicine

## 2021-06-08 NOTE — Telephone Encounter (Signed)
Faxed last three office notes to the number that was on cover sheet received

## 2021-06-08 NOTE — Telephone Encounter (Signed)
Evelyn Barker from [facility] called to advise that she has not received a response in regards to the faxes she sent on 6/21 and 6/27 for Medical Notes for the last 2-3 visits. She can be reached at [phone].

## 2021-06-16 DIAGNOSIS — Z20822 Contact with and (suspected) exposure to covid-19: Secondary | ICD-10-CM | POA: Diagnosis not present

## 2021-07-01 ENCOUNTER — Ambulatory Visit: Payer: Medicare Other | Admitting: Internal Medicine

## 2021-07-06 ENCOUNTER — Ambulatory Visit (INDEPENDENT_AMBULATORY_CARE_PROVIDER_SITE_OTHER): Payer: Medicare Other | Admitting: Internal Medicine

## 2021-07-06 ENCOUNTER — Other Ambulatory Visit: Payer: Self-pay

## 2021-07-06 ENCOUNTER — Encounter: Payer: Self-pay | Admitting: Internal Medicine

## 2021-07-06 VITALS — BP 116/70 | HR 71 | Temp 97.7°F | Ht 64.0 in | Wt 92.0 lb

## 2021-07-06 DIAGNOSIS — K8689 Other specified diseases of pancreas: Secondary | ICD-10-CM

## 2021-07-06 DIAGNOSIS — D490 Neoplasm of unspecified behavior of digestive system: Secondary | ICD-10-CM | POA: Diagnosis not present

## 2021-07-06 DIAGNOSIS — F0280 Dementia in other diseases classified elsewhere without behavioral disturbance: Secondary | ICD-10-CM

## 2021-07-06 DIAGNOSIS — R1013 Epigastric pain: Secondary | ICD-10-CM

## 2021-07-06 DIAGNOSIS — G309 Alzheimer's disease, unspecified: Secondary | ICD-10-CM | POA: Diagnosis not present

## 2021-07-06 MED ORDER — SUCRALFATE 1 G PO TABS: 1.0000 g | ORAL_TABLET | Freq: Three times a day (TID) | ORAL | 0 refills | Status: DC

## 2021-07-06 NOTE — Progress Notes (Signed)
Chief Complaint  Patient presents with   Follow-up   F/u  1. 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   Technique: Precontrast and dynamic postcontrast MR imaging of the abdomen  was performed using the Liver Protocol. IV contrast was administered to  improve disease detection and further define anatomy. MRCP was also  performed, with 3-D reconstruction performed to evaluate biliary anatomy.   Premedication/adverse events:  None.   FINDINGS: No free air, free fluid or fluid collection.   Lower chest: The visualized lungs are aerated. No pleural or pericardial  effusion.   ABDOMEN:   Liver: The liver enhances homogeneously without suspicious mass.  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   Gallbladder and biliary: Normal gallbladder without cholelithiasis. Normal  caliber bile ducts.   Spleen: Normal spleen.   Pancreas: Cystic lesion in the pancreatic head/neck measures 2.0 cm with  intrinsic enhancing septations, unchanged from prior exam (series 4, image  15). No intrinsic enhancing nodularity. Normal caliber main pancreatic  duct. Additional adjacent subcentimeter lesion is unchanged.   Adrenal glands: Normal adrenal glands.   Kidneys and ureters: Normal kidneys and ureters.   GI tract: The stomach is decompressed and poorly evaluated. Normal caliber  small bowel and colon.   Vascular structures: Normal caliber abdominal aorta. The celiac axis, SMA,  bilateral renal arteries, and IMA are patent.  Portal and mesenteric venous  structures are patent.   Lymph nodes: No abdominal lymphadenopathy.   Bones: Unremarkable.   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 Normal caliber main pancreatic duct. Continued attention on  follow-up imaging is recommended.   Electronically Reviewed by:  Mercie Eon, DO, [facility]  Electronically Reviewed on:  05/13/2020 8:58 AM   I have reviewed the images and concur with the above findings.   Electronically Signed by:  Domingo Mend, MD, [facility]  Electronically Signed on:  05/13/2020 4:39 PM    2. Mild to moderate dementia neurology Dr. Manuella Ghazi considering namenda will w/u 10/2021  She will be moving to memory care at [facility] 07/07/21   Review of Systems  Constitutional:  Negative for weight loss.  HENT:  Negative for hearing loss.   Eyes:  Negative for blurred vision.  Cardiovascular:  Negative for chest pain.  Gastrointestinal:  Positive for abdominal pain. Negative for nausea and vomiting.  Skin:  Negative for rash.  Psychiatric/Behavioral:  Positive for memory loss.   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 +   Past Surgical History:  Procedure Laterality Date   ABDOMINAL HYSTERECTOMY     DUB,  endometriosis no h/o abnormal pap    APPENDECTOMY     COLONOSCOPY N/A 04/30/2015   Procedure: COLONOSCOPY;  Surgeon: Josefine Class, MD;  Location: [facility] ENDOSCOPY;  Service: Endoscopy;  Laterality: N/A;   TONSILLECTOMY     Family History  Problem Relation Age of Onset   Stroke Mother    Cancer Father        prostate    Pneumonia Maternal Grandmother     Hip fracture Maternal Grandmother    Stroke Paternal Grandfather    Skin cancer Other    Breast cancer Neg Hx    Social History   Socioeconomic History   Marital status: Widowed    Spouse name: Not on file   Number of children: Not on file   Years of education: Not on file   Highest education level: Not on file  Occupational History   Not on file  Tobacco Use   Smoking status: Never   Smokeless tobacco: Never  Vaping Use   Vaping Use: Never used  Substance and Sexual Activity   Alcohol use: Not Currently   Drug use: Not Currently   Sexual activity: Not Currently    Partners: Male  Other Topics Concern   Not on file  Social History Narrative   Widowed    Grew up in [place] moved around when husband living    No kids    Lives [place]    Never smoker    Likes to pain for [place]    Enjoys plays    No guns    Wears seat belt, safe in relationship   Retired Teacher, 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    Social Determinants of Radio broadcast assistant Strain: Low Risk    Difficulty of Paying Living Expenses: Not hard at all  Food Insecurity: No Food Insecurity   Worried About Charity fundraiser in the Last Year: Never true   Arboriculturist in the Last Year: Never true  Transportation Needs: No Transportation Needs   Lack of Transportation (Medical): No   Lack of Transportation (Non-Medical): No  Physical Activity: Unknown   Days of Exercise per Week: 0 days   Minutes of Exercise per Session: Not on file  Stress: No Stress Concern Present   Feeling of Stress : Not at all  Social Connections: Unknown   Frequency of Communication with Friends and Family: More than three times a week   Frequency of Social Gatherings with Friends and Family: More than three times a week   Attends Religious Services: Not on file   Active Member of Clubs or Organizations: Not on file   Attends Archivist Meetings: Not on file   Marital Status:  Widowed  Human resources officer Violence: Not At Risk   Fear of Current or Ex-Partner: No   Emotionally Abused: No   Physically Abused: No   Sexually Abused: No   Current Meds  Medication Sig   ALPRAZolam (XANAX) 0.25 MG tablet Take 0.5-1 tablets (0.125-0.25 mg total) by mouth at bedtime as needed for anxiety.   gentamicin cream (GARAMYCIN) 0.1 % Apply 1 application topically 2 (two) times daily.   memantine (NAMENDA) 5 MG tablet Take 1 pill daily x 1 week then increase bid Per Dr. Manuella Ghazi neurology. Can you bubble pack?   mirtazapine (REMERON) 7.5 MG tablet Take 1 tablet (7.5 mg total) by mouth at bedtime.   multivitamin-lutein (OCUVITE-LUTEIN) CAPS capsule Take  1 capsule by mouth every other day.   omeprazole (PRILOSEC) 40 MG capsule Take 1 capsule (40 mg total) by mouth daily. 30 minutes before dinner   sucralfate (CARAFATE) 1 g tablet Take 1 tablet (1 g total) by mouth 4 (four) times daily -  with meals and at bedtime. Stir in liquid to drink Prn   triamcinolone cream (KENALOG) 0.1 % Apply 1 application topically 2 (two) times daily. Right upper back as needed   Allergies  Allergen Reactions   Amoxil [Amoxicillin]    Tylenol [Acetaminophen]     ?reaction     Ampicillin Rash   Elemental Sulfur Rash   Keflex [Cephalexin] Rash   Macrobid [Nitrofurantoin] Rash   Ofloxacin Rash   Penicillins Rash   Recent Results (from the past 2160 hour(s))  CBC     Status: None   Collection Time: 05/07/21 11:15 AM  Result Value Ref Range   WBC 5.7 4.0 - 10.5 K/uL   RBC 4.25 3.87 - 5.11 MIL/uL   Hemoglobin 14.3 12.0 - 15.0 g/dL   HCT 41.4 36.0 - 46.0 %   MCV 97.4 80.0 - 100.0 fL   MCH 33.6 26.0 - 34.0 pg   MCHC 34.5 30.0 - 36.0 g/dL   RDW 12.6 11.5 - 15.5 %   Platelets 211 150 - 400 K/uL   nRBC 0.0 0.0 - 0.2 %    Comment: Performed at [facility], [address] [place], [place] [address]  Comprehensive metabolic panel     Status: Abnormal   Collection Time: 05/07/21 11:15 AM   Result Value Ref Range   Sodium 139 135 - 145 mmol/L   Potassium 4.1 3.5 - 5.1 mmol/L   Chloride 103 98 - 111 mmol/L   CO2 29 22 - 32 mmol/L   Glucose, Bld 95 70 - 99 mg/dL    Comment: Glucose reference range applies only to samples taken after fasting for at least 8 hours.   BUN 29 (H) 8 - 23 mg/dL   Creatinine, Ser 0.75 0.44 - 1.00 mg/dL   Calcium 9.5 8.9 - 10.3 mg/dL   Total Protein 6.6 6.5 - 8.1 g/dL   Albumin 4.2 3.5 - 5.0 g/dL   AST 24 15 - 41 U/L   ALT 21 0 - 44 U/L   Alkaline Phosphatase 44 38 - 126 U/L   Total Bilirubin 1.2 0.3 - 1.2 mg/dL   GFR, Estimated >60 >60 mL/min    Comment: (NOTE) Calculated using the CKD-EPI Creatinine Equation (2021)    Anion gap 7 5 - 15    Comment: Performed at [facility], [place], [place] [address]  Troponin I (High Sensitivity)     Status: None   Collection Time: 05/07/21 11:15 AM  Result Value Ref Range   Troponin I (High Sensitivity) 3 <18 ng/L    Comment: (NOTE) Elevated high sensitivity troponin I (hsTnI) values and significant  changes across serial measurements may suggest ACS but many other  chronic and acute conditions are known to elevate hsTnI results.  Refer to the "Links" section for chest pain algorithms and additional  guidance. Performed at [facility], [address] [place], [place] [address]    Objective  Body mass index is 15.79 kg/m. Wt Readings from Last 3 Encounters:  07/06/21 92 lb (41.7 kg)  05/10/21 88 lb 9.6 oz (40.2 kg)  05/07/21 88 lb (39.9 kg)   Temp Readings from Last 3 Encounters:  07/06/21 97.7 F (  36.5 C) (Temporal)  05/10/21 98 F (36.7 C) (Oral)  05/07/21 98.3 F (36.8 C) (Oral)   BP Readings from Last 3 Encounters:  07/06/21 116/70  05/10/21 106/70  05/07/21 (!) 172/86   Pulse Readings from Last 3 Encounters:  07/06/21 71  05/10/21 75  05/07/21 62    Physical Exam Vitals reviewed.  Constitutional:      Appearance: Normal appearance.  She is well-developed and well-groomed.  HENT:     Head: Normocephalic and atraumatic.  Eyes:     Conjunctiva/sclera: Conjunctivae normal.     Pupils: Pupils are equal, round, and reactive to light.  Cardiovascular:     Rate and Rhythm: Normal rate and regular rhythm.     Heart sounds: Normal heart sounds. No murmur heard. Pulmonary:     Effort: Pulmonary effort is normal.     Breath sounds: Normal breath sounds.  Abdominal:     General: Abdomen is flat. Bowel sounds are normal.     Tenderness: There is abdominal tenderness.  Skin:    General: Skin is warm and dry.  Neurological:     General: No focal deficit present.     Mental Status: She is alert and oriented to person, place, and time. Mental status is at baseline.     Gait: Gait normal.  Psychiatric:        Attention and Perception: Attention and perception normal.        Mood and Affect: Mood and affect normal.        Speech: Speech normal.        Behavior: Behavior normal. Behavior is cooperative.        Thought Content: Thought content normal.        Cognition and Memory: Cognition and memory normal.        Judgment: Judgment normal.    Assessment  Plan  Mass of pancreas with epigastric pain with food and h/o elevated lipase-  ? If needs creon needs to f/u [facility] for this and further repeat MRI/CT imaging pancreatic mass   Plan: sucralfate (CARAFATE) 1 g tablet qid prn, Ambulatory referral to Gastroenterology IPMN (intraductal papillary mucinous neoplasm) - Plan: sucralfate (CARAFATE) 1 g tablet, Ambulatory referral to Gastroenterology Procedure:  MRI Abdomen with and without contrast, with MRCP   Indication: Pancreatic cyst/pseudocyst, pancreatitic cyst, K86.2 Cyst of  pancreas   Comparison:  10/17/2019 outside MRI.   Technique: Precontrast and dynamic postcontrast MR imaging of the abdomen  was performed using the Liver Protocol. IV contrast was administered to  improve disease detection and further define  anatomy. MRCP was also  performed, with 3-D reconstruction performed to evaluate biliary anatomy.   Premedication/adverse events:  None.   FINDINGS: No free air, free fluid or fluid collection.   Lower chest: The visualized lungs are aerated. No pleural or pericardial  effusion.   ABDOMEN:   Liver: The liver enhances homogeneously without suspicious mass.  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   Gallbladder and biliary: Normal gallbladder without cholelithiasis. Normal  caliber bile ducts.   Spleen: Normal spleen.   Pancreas: Cystic lesion in the pancreatic head/neck measures 2.0 cm with  intrinsic enhancing septations, unchanged from prior exam (series 4, image  15). No intrinsic enhancing nodularity. Normal caliber main pancreatic  duct. Additional adjacent subcentimeter lesion is unchanged.   Adrenal glands: Normal adrenal glands.   Kidneys and ureters: Normal kidneys and ureters.  GI tract: The stomach is decompressed and poorly evaluated. Normal caliber  small bowel and colon.   Vascular structures: Normal caliber abdominal aorta. The celiac axis, SMA,  bilateral renal arteries, and IMA are patent. Portal and mesenteric venous  structures are patent.   Lymph nodes: No abdominal lymphadenopathy.   Bones: Unremarkable.   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 Normal caliber main pancreatic duct. Continued attention on  follow-up imaging is recommended.   Electronically Reviewed by:  Mercie Eon, DO, [facility]  Electronically Reviewed on:  05/13/2020 8:58 AM   I have reviewed the images and concur with the above findings.   Electronically Signed by:  Domingo Mend, MD, [facility]  Electronically Signed on:  05/13/2020 4:39  PM   Alzheimer's dementia without behavioral disturbance, unspecified timing of dementia onset ([place])  F/u Dr. Manuella Ghazi 10/15/21 call to f/u  FL2 form given to pt and faxed to [facility] moving to memory care   HM Flu shot utd pna 23 had 09/11/15 prevnar utd  Given Rx shingrix prev Rx 2/2 given Tdap had 10/11/12 zostervax had  Hep C neg 01/07/19  Hep B not immune, hep A immune 01/07/19 covid vx had 3/3    Never smoker   No need for pap s/p hysterectomy DUB, endometriosis   Mammogram 01/07/21 NEGATIVE    A1C 5.6  dexa had 11/15/2017 +osteoporosis -2.9 left femur will repeat  ordered  -Pt never took fosamax disc prolia prev.  declines 2/2 c/w side effects    Colonoscopy h/o polyp tubular 05/03/15  Had repeat EGD/colonoscopy 06/17/2019 [facility] Dr. 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   Technique: Precontrast and dynamic postcontrast MR imaging of the abdomen  was performed using the Liver Protocol. IV contrast was administered to  improve disease detection and further define anatomy. MRCP was also  performed, with 3-D reconstruction performed to evaluate biliary anatomy.   Premedication/adverse events:  None.   FINDINGS: No free air, free fluid or fluid collection.   Lower chest: The visualized lungs are aerated. No pleural or pericardial  effusion.   ABDOMEN:   Liver: The liver enhances homogeneously without suspicious mass.   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   Gallbladder and biliary: Normal gallbladder without cholelithiasis. Normal  caliber bile ducts.   Spleen: Normal spleen.   Pancreas: Cystic lesion in the pancreatic head/neck measures 2.0 cm with  intrinsic enhancing septations, unchanged from prior exam (series 4, image  15). No intrinsic enhancing nodularity. Normal caliber main pancreatic  duct. Additional adjacent subcentimeter lesion is unchanged.   Adrenal glands: Normal adrenal glands.   Kidneys and ureters: Normal kidneys and ureters.   GI tract: The stomach is decompressed and poorly evaluated. Normal caliber  small bowel  and colon.   Vascular structures: Normal caliber abdominal aorta. The celiac axis, SMA,  bilateral renal arteries, and IMA are patent. Portal and mesenteric venous  structures are patent.   Lymph nodes: No abdominal lymphadenopathy.   Bones: Unremarkable.   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 Normal caliber main pancreatic duct. Continued attention on  follow-up imaging is recommended.   Electronically Reviewed by:  Mercie Eon, DO, [facility]  Electronically Reviewed on:  05/13/2020 8:58 AM   I have reviewed the images and concur with the above findings.   Electronically Signed by:  Domingo Mend, MD, [facility]  Electronically Signed on:  05/13/2020 4:39 PM  Transportation [place] [phone]  Provider: Dr. Olivia Mackie McLean-Scocuzza-Internal Medicine

## 2021-07-06 NOTE — Patient Instructions (Addendum)
Please let us know if phone # or address changes   Referred to [facility] clinic GI Dr. Alice Reichert or Dr. Burman Riis to follow up on pancreas mass  --Also ask GI if creon would help with abdominal pain [phone]  [place] [address]      Specialties     Gastroenterology      Ordered carafate 1 gram tablet to dissolve in liquid before meals up to 4x per day to help with abdominal pain    Due for follow neurology 10/2021 early Dr. 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 Normal caliber main pancreatic duct. Continued attention on  follow-up imaging is recommended.   Electronically Reviewed by:  Mercie Eon, DO, [facility]  Electronically Reviewed on:  05/13/2020 8:58 AM   I have reviewed the images and concur with the above findings.   Electronically Signed by:  Domingo Mend, MD, [facility]  Electronically Signed on:  05/13/2020 4:39 PM  Memantine Tablets What is this medication? MEMANTINE (MEM an teen) is used to treat dementia caused by Alzheimer's disease. This medicine may be used for other purposes; ask your health care provider orpharmacist if you have questions. COMMON BRAND NAME(S): Namenda What should I tell my care team before I take this medication? They need to know if you have any of these conditions: difficulty passing urine kidney disease liver disease seizures an unusual or allergic reaction  to memantine, other medicines, foods, dyes, or preservatives pregnant or trying to get pregnant breast-feeding How should I use this medication? Take this medicine by mouth with a glass of water. Follow the directions on the prescription label. You may take this medicine with or without food. Take your doses at regular intervals. Do not take your medicine more often than directed. Continue to take your medicine even if you feel better. Do not stop takingexcept on the advice of your doctor or health care professional. Talk to your pediatrician regarding the use of this medicine in children.Special care may be needed. Overdosage: If you think you have taken too much of this medicine contact apoison control center or emergency room at once. NOTE: This medicine is only for you. Do not share this medicine with others. What if I miss a dose? If you miss a dose, take it as soon as you can. If it is almost time for your next dose, take only that dose. Do not take double or extra doses. If you do not take your medicine for several days, contact your health care provider.Your dose may need to be changed. What may interact with this medication? acetazolamide amantadine cimetidine dextromethorphan dofetilide hydrochlorothiazide ketamine metformin methazolamide quinidine ranitidine sodium bicarbonate triamterene This list may not describe all possible interactions. Give your health care provider a list of all the medicines, herbs, non-prescription drugs, or dietary supplements you use. Also tell them if you smoke, drink alcohol, or use illegaldrugs. Some items may interact with your  medicine. What should I watch for while using this medication? Visit your doctor or health care professional for regular checks on your progress. Check with your doctor or health care professional if there is noimprovement in your symptoms or if they get worse. You may get drowsy or dizzy. Do not drive, use machinery, or do  anything that needs mental alertness until you know how this drug affects you. Do not stand or sit up quickly, especially if you are an older patient. This reduces the risk of dizzy or fainting spells. Alcohol can make you more drowsy and dizzy.Avoid alcoholic drinks. What side effects may I notice from receiving this medication? Side effects that you should report to your doctor or health care professionalas soon as possible: allergic reactions like skin rash, itching or hives, swelling of the face, lips, or tongue agitation or a feeling of restlessness depressed mood dizziness hallucinations redness, blistering, peeling or loosening of the skin, including inside the mouth seizures vomiting Side effects that usually do not require medical attention (report to yourdoctor or health care professional if they continue or are bothersome): constipation diarrhea headache nausea trouble sleeping This list may not describe all possible side effects. Call your doctor for medical advice about side effects. You may report side effects to FDA at1-800-FDA-1088. Where should I keep my medication? Keep out of the reach of children. Store at room temperature between 15 degrees and 30 degrees C (59 degrees and86 degrees F). Throw away any unused medicine after the expiration date. NOTE: This sheet is a summary. It may not cover all possible information. If you have questions about this medicine, talk to your doctor, pharmacist, orhealth care provider.  2022 Elsevier/Gold Standard (2013-09-15 14:10:42)

## 2021-07-22 DIAGNOSIS — Z741 Need for assistance with personal care: Secondary | ICD-10-CM | POA: Diagnosis not present

## 2021-07-22 DIAGNOSIS — R278 Other lack of coordination: Secondary | ICD-10-CM | POA: Diagnosis not present

## 2021-07-22 DIAGNOSIS — R4189 Other symptoms and signs involving cognitive functions and awareness: Secondary | ICD-10-CM | POA: Diagnosis not present

## 2021-07-22 DIAGNOSIS — G301 Alzheimer's disease with late onset: Secondary | ICD-10-CM | POA: Diagnosis not present

## 2021-07-22 DIAGNOSIS — F419 Anxiety disorder, unspecified: Secondary | ICD-10-CM | POA: Diagnosis not present

## 2021-07-25 ENCOUNTER — Ambulatory Visit (INDEPENDENT_AMBULATORY_CARE_PROVIDER_SITE_OTHER): Payer: Medicare Other

## 2021-07-25 ENCOUNTER — Ambulatory Visit: Payer: Medicare Other

## 2021-07-25 VITALS — Ht 64.0 in | Wt 99.2 lb

## 2021-07-25 DIAGNOSIS — Z0000 Encounter for general adult medical examination without abnormal findings: Secondary | ICD-10-CM | POA: Diagnosis not present

## 2021-07-25 NOTE — Patient Instructions (Addendum)
Evelyn Barker , Thank you for taking time to come for your Medicare Wellness Visit. I appreciate your ongoing commitment to your health goals. Please review the following plan we discussed and let me know if I can assist you in the future.   These are the goals we discussed:  Goals      Increase physical activity     Walk more for exercise        This is a list of the screening recommended for you and due dates:  Health Maintenance  Topic Date Due   Flu Shot  09/10/2021*   Zoster (Shingles) Vaccine (1 of 2) 10/25/2021*   COVID-19 Vaccine (4 - Booster for Moderna series) 07/11/2022*   Tetanus Vaccine  10/11/2022   DEXA scan (bone density measurement)  Completed   Hepatitis C Screening: USPSTF Recommendation to screen - Ages 26-79 yo.  Completed   Pneumonia vaccines  Completed   HPV Vaccine  Aged Out  *Topic was postponed. The date shown is not the original due date.    Advanced directives: End of life planning; Advance aging; Advanced directives discussed.  Copy of current HCPOA/Living Will requested.    Conditions/risks identified: none new  Follow up in one year for your annual wellness visit    Preventive Care 65 Years and Older, Female Preventive care refers to lifestyle choices and visits with your health care provider that can promote health and wellness. What does preventive care include? A yearly physical exam. This is also called an annual well check. Dental exams once or twice a year. Routine eye exams. Ask your health care provider how often you should have your eyes checked. Personal lifestyle choices, including: Daily care of your teeth and gums. Regular physical activity. Eating a healthy diet. Avoiding tobacco and drug use. Limiting alcohol use. Practicing safe sex. Taking low-dose aspirin every day. Taking vitamin and mineral supplements as recommended by your health care provider. What happens during an annual well check? The services and screenings done  by your health care provider during your annual well check will depend on your age, overall health, lifestyle risk factors, and family history of disease. Counseling  Your health care provider may ask you questions about your: Alcohol use. Tobacco use. Drug use. Emotional well-being. Home and relationship well-being. Sexual activity. Eating habits. History of falls. Memory and ability to understand (cognition). Work and work Statistician. Reproductive health. Screening  You may have the following tests or measurements: Height, weight, and BMI. Blood pressure. Lipid and cholesterol levels. These may be checked every 5 years, or more frequently if you are over 3 years old. Skin check. Lung cancer screening. You may have this screening every year starting at age 42 if you have a 30-pack-year history of smoking and currently smoke or have quit within the past 15 years. Fecal occult blood test (FOBT) of the stool. You may have this test every year starting at age 60. Flexible sigmoidoscopy or colonoscopy. You may have a sigmoidoscopy every 5 years or a colonoscopy every 10 years starting at age 67. Hepatitis C blood test. Hepatitis B blood test. Sexually transmitted disease (STD) testing. Diabetes screening. This is done by checking your blood sugar (glucose) after you have not eaten for a while (fasting). You may have this done every 1-3 years. Bone density scan. This is done to screen for osteoporosis. You may have this done starting at age 19. Mammogram. This may be done every 1-2 years. Talk to your health care provider  about how often you should have regular mammograms. Talk with your health care provider about your test results, treatment options, and if necessary, the need for more tests. Vaccines  Your health care provider may recommend certain vaccines, such as: Influenza vaccine. This is recommended every year. Tetanus, diphtheria, and acellular pertussis (Tdap, Td) vaccine. You  may need a Td booster every 10 years. Zoster vaccine. You may need this after age 88. Pneumococcal 13-valent conjugate (PCV13) vaccine. One dose is recommended after age 90. Pneumococcal polysaccharide (PPSV23) vaccine. One dose is recommended after age 43. Talk to your health care provider about which screenings and vaccines you need and how often you need them. This information is not intended to replace advice given to you by your health care provider. Make sure you discuss any questions you have with your health care provider. Document Released: 12/24/2015 Document Revised: 08/16/2016 Document Reviewed: 09/28/2015 Elsevier Interactive Patient Education  2017 [employer] Prevention in the Home Falls can cause injuries. They can happen to people of all ages. There are many things you can do to make your home safe and to help prevent falls. What can I do on the outside of my home? Regularly fix the edges of walkways and driveways and fix any cracks. Remove anything that might make you trip as you walk through a door, such as a raised step or threshold. Trim any bushes or trees on the path to your home. Use bright outdoor lighting. Clear any walking paths of anything that might make someone trip, such as rocks or tools. Regularly check to see if handrails are loose or broken. Make sure that both sides of any steps have handrails. Any raised decks and porches should have guardrails on the edges. Have any leaves, snow, or ice cleared regularly. Use sand or salt on walking paths during winter. Clean up any spills in your garage right away. This includes oil or grease spills. What can I do in the bathroom? Use night lights. Install grab bars by the toilet and in the tub and shower. Do not use towel bars as grab bars. Use non-skid mats or decals in the tub or shower. If you need to sit down in the shower, use a plastic, non-slip stool. Keep the floor dry. Clean up any water that spills  on the floor as soon as it happens. Remove soap buildup in the tub or shower regularly. Attach bath mats securely with double-sided non-slip rug tape. Do not have throw rugs and other things on the floor that can make you trip. What can I do in the bedroom? Use night lights. Make sure that you have a light by your bed that is easy to reach. Do not use any sheets or blankets that are too big for your bed. They should not hang down onto the floor. Have a firm chair that has side arms. You can use this for support while you get dressed. Do not have throw rugs and other things on the floor that can make you trip. What can I do in the kitchen? Clean up any spills right away. Avoid walking on wet floors. Keep items that you use a lot in easy-to-reach places. If you need to reach something above you, use a strong step stool that has a grab bar. Keep electrical cords out of the way. Do not use floor polish or wax that makes floors slippery. If you must use wax, use non-skid floor wax. Do not have throw rugs and  other things on the floor that can make you trip. What can I do with my stairs? Do not leave any items on the stairs. Make sure that there are handrails on both sides of the stairs and use them. Fix handrails that are broken or loose. Make sure that handrails are as long as the stairways. Check any carpeting to make sure that it is firmly attached to the stairs. Fix any carpet that is loose or worn. Avoid having throw rugs at the top or bottom of the stairs. If you do have throw rugs, attach them to the floor with carpet tape. Make sure that you have a light switch at the top of the stairs and the bottom of the stairs. If you do not have them, ask someone to add them for you. What else can I do to help prevent falls? Wear shoes that: Do not have high heels. Have rubber bottoms. Are comfortable and fit you well. Are closed at the toe. Do not wear sandals. If you use a stepladder: Make  sure that it is fully opened. Do not climb a closed stepladder. Make sure that both sides of the stepladder are locked into place. Ask someone to hold it for you, if possible. Clearly mark and make sure that you can see: Any grab bars or handrails. First and last steps. Where the edge of each step is. Use tools that help you move around (mobility aids) if they are needed. These include: Canes. Walkers. Scooters. Crutches. Turn on the lights when you go into a dark area. Replace any light bulbs as soon as they burn out. Set up your furniture so you have a clear path. Avoid moving your furniture around. If any of your floors are uneven, fix them. If there are any pets around you, be aware of where they are. Review your medicines with your doctor. Some medicines can make you feel dizzy. This can increase your chance of falling. Ask your doctor what other things that you can do to help prevent falls. This information is not intended to replace advice given to you by your health care provider. Make sure you discuss any questions you have with your health care provider. Document Released: 09/23/2009 Document Revised: 05/04/2016 Document Reviewed: 01/01/2015 Elsevier Interactive Patient Education  2017 [employer].

## 2021-07-25 NOTE — Progress Notes (Signed)
Subjective:   Taliah Thulin is a 75 y.o. female who presents for Medicare Annual (Subsequent) preventive examination.  Review of Systems    No ROS.  Medicare Wellness Virtual Visit.  Visual/audio telehealth visit, UTA vital signs.   See social history for additional risk factors.   Cardiac Risk Factors include: advanced age (>38mn, >>7women)     Objective:    Today's Vitals   07/25/21 1129  Weight: 99 lb 3.2 oz (45 kg)  Height: '5\' 4"'$  (1.626 m)   Body mass index is 17.03 kg/m.  Advanced Directives 07/25/2021 05/07/2021 10/14/2020 10/04/2020 07/22/2020 01/08/2020 07/28/2019  Does Patient Have a Medical Advance Directive? Yes Yes No Yes Yes Yes Yes  Type of AParamedicof ABroadmoorLiving will HStarrLiving will - HGeorgetownLiving will HForest ParkLiving will HUpper PohatcongLiving will HNorthportLiving will;Out of facility DNR (pink MOST or yellow form)  Does patient want to make changes to medical advance directive? No - Patient declined - - Yes (MAU/Ambulatory/Procedural Areas - Information given) No - Patient declined No - Patient declined No - Patient declined  Copy of HCascade Locksin Chart? No - copy requested - - - No - copy requested No - copy requested No - copy requested  Would patient like information on creating a medical advance directive? - - - - - No - Patient declined -    Current Medications (verified) Outpatient Encounter Medications as of 07/25/2021  Medication Sig   ALPRAZolam (XANAX) 0.25 MG tablet Take 0.5-1 tablets (0.125-0.25 mg total) by mouth at bedtime as needed for anxiety.   gentamicin cream (GARAMYCIN) 0.1 % Apply 1 application topically 2 (two) times daily.   memantine (NAMENDA) 5 MG tablet Take 1 pill daily x 1 week then increase bid Per Dr. SManuella Ghazineurology. Can you bubble pack?   mirtazapine (REMERON) 7.5 MG tablet Take 1 tablet  (7.5 mg total) by mouth at bedtime.   multivitamin-lutein (OCUVITE-LUTEIN) CAPS capsule Take 1 capsule by mouth every other day.   omeprazole (PRILOSEC) 40 MG capsule Take 1 capsule (40 mg total) by mouth daily. 30 minutes before dinner   sucralfate (CARAFATE) 1 g tablet Take 1 tablet (1 g total) by mouth 4 (four) times daily -  with meals and at bedtime. Stir in liquid to drink Prn   triamcinolone cream (KENALOG) 0.1 % Apply 1 application topically 2 (two) times daily. Right upper back as needed   No facility-administered encounter medications on file as of 07/25/2021.    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 +   Past Surgical History:  Procedure Laterality Date   ABDOMINAL HYSTERECTOMY     DUB, endometriosis no h/o abnormal pap    APPENDECTOMY     COLONOSCOPY N/A 04/30/2015   Procedure: COLONOSCOPY;  Surgeon: MJosefine Class MD;  [facility]  Service: Endoscopy;  Laterality: N/A;   TONSILLECTOMY     Family History  Problem Relation Age of  Onset   Stroke Mother    Cancer Father        prostate    Pneumonia Maternal Grandmother    Hip fracture Maternal Grandmother    Stroke Paternal Grandfather    Skin cancer Other    Breast cancer Neg Hx    Social History   Socioeconomic History   Marital status: Widowed    Spouse name: Not on file   Number of children: Not on file   Years of education: Not on file   Highest education level: Not on file  Occupational History   Not on file  Tobacco Use   Smoking status: Never   Smokeless  tobacco: Never  Vaping Use   Vaping Use: Never used  Substance and Sexual Activity   Alcohol use: Not Currently   Drug use: Not Currently   Sexual activity: Not Currently    Partners: Male  Other Topics Concern   Not on file  Social History Narrative   Widowed    Grew up in [place] moved around when husband living    No kids    [place]    Never smoker    Likes to pain for [place]    Enjoys plays    No guns    Wears seat belt, safe in relationship   Retired Teacher, 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    Social Determinants of Radio broadcast assistant Strain: Low Risk    Difficulty of Paying Living Expenses: Not hard at all  Food Insecurity: No Food Insecurity   Worried About Charity fundraiser in the Last Year: Never true   Arboriculturist in the Last Year: Never true  Transportation Needs: No Transportation Needs   Lack of Transportation (Medical): No   Lack of Transportation (Non-Medical): No  Physical Activity: Unknown   Days of Exercise per Week: 0 days   Minutes of Exercise per Session: Not on file  Stress: No Stress Concern Present   Feeling of Stress : Not at all  Social Connections: Unknown   Frequency of Communication with Friends and Family: More than three times a week   Frequency of Social Gatherings with Friends and Family: More than three times a week   Attends Religious Services: Not on file   Active Member of Clubs or Organizations: Not on file   Attends Archivist Meetings: Not on file   Marital Status: Widowed    Tobacco Counseling Counseling given: Not Answered   Clinical Intake:  Pre-visit preparation completed: Yes        Diabetes: No  How often do you need to have someone help you when you read instructions, pamphlets, or other written materials from your doctor or pharmacy?: 1 - Never    Interpreter Needed?: No      Activities of Daily Living In your present state of health, do you  have any difficulty performing the following activities: 07/25/2021  Hearing? N  Vision? N  Difficulty concentrating or making decisions? Y  Comment Followed by Neurology  Walking or climbing stairs? N  Dressing or bathing? N  Doing errands, shopping? Y  Preparing Food and eating ? N  Comment Staff meal prep. Self feeds.  Using the Toilet? N  In the past six months, have you accidently leaked urine? N  Do you have problems with loss of bowel control? N  Managing your Medications? Y  Comment Staff assist  Managing your Finances? Y  Housekeeping or managing your Housekeeping? Y  Comment Maid assist  Some recent data might be hidden    Patient Care Team: McLean-Scocuzza, Nino Glow, MD as PCP - General (Internal Medicine)  Indicate any recent Medical Services you may have received from other than [facility] providers in the past year (date may be approximate).     Assessment:   This is a routine wellness examination for Brandice.  I connected with Dea today by telephone and verified that I am speaking with the correct person using two identifiers. Location patient: home Location provider: work Persons participating in the virtual visit: patient, Marine scientist.    I discussed the limitations, risks, security and privacy concerns of performing an evaluation and management service by telephone and the availability of in person appointments. The patient expressed understanding and verbally consented to this telephonic visit.    Interactive audio and video telecommunications were attempted between this provider and patient, however failed, due to patient having technical difficulties OR patient did not have access to video capability.  We continued and completed visit with audio only.  Some vital signs may be absent or patient reported.   Hearing/Vision screen Hearing Screening - Comments:: Patient is able to hear conversational tones without difficulty.  No issues reported. Vision Screening -  Comments:: Follow[facility]  Wears corrective lenses   Dietary issues and exercise activities discussed: Current Exercise Habits: Home exercise routine, Intensity: Mild   Goals Addressed             This Visit's Progress    Increase physical activity       Walk more for exercise       Depression Screen PHQ 2/9 Scores 07/25/2021 07/22/2020 05/25/2020 03/03/2020 11/21/2019 07/28/2019 07/23/2019  PHQ - 2 Score 0 0 0 0 0 0 0  PHQ- 9 Score - - - - - - 0    Fall Risk Fall Risk  07/25/2021 07/06/2021 05/10/2021 04/01/2021 11/23/2020  Falls in the past year? 0 0 0 0 0  Number falls in past yr: 0 0 0 0 0  Injury with Fall? - 0 0 0 0  Risk for fall due to : - - Impaired balance/gait - -  Follow up Falls evaluation completed Falls evaluation completed Falls evaluation completed Falls evaluation completed Falls evaluation completed    [place]: Adequate lighting in your home to reduce risk of falls? Yes   ASSISTIVE DEVICES UTILIZED TO PREVENT FALLS: Life alert? Yes   TIMED UP AND GO: Was the test performed? No .   Cognitive Function: MMSE - Mini Mental State Exam 07/25/2021  Not completed: Unable to complete     6CIT Screen 07/22/2020  What Year? 0 points  What month? 0 points  What time? 0 points  Months in reverse 0 points  Repeat phrase 0 points    Immunizations Immunization History  Administered Date(s) Administered   Fluad Quad(high Dose 65+) 08/28/2019   Hep A / Hep B 09/03/2019   Influenza, High Dose Seasonal PF 09/09/2020   Influenza-Unspecified 09/11/2015   Moderna Sars-Covid-2 Vaccination 12/23/2019, 01/20/2020, 10/26/2020   Pneumococcal Conjugate-13 11/23/2020   Pneumococcal Polysaccharide-23 09/11/2015   Tdap 12/12/2011   Zoster, Live 12/12/2003    Health Maintenance Health Maintenance  Topic Date Due   INFLUENZA VACCINE  09/10/2021 (Originally 07/11/2021)   Zoster Vaccines- Shingrix (1 of 2) 10/25/2021  (Originally 05/24/1965)   COVID-19 Vaccine (4 - Booster for  Moderna series) 07/11/2022 (Originally 01/26/2021)   TETANUS/TDAP  10/11/2022   DEXA SCAN  Completed   Hepatitis C Screening  Completed   PNA vac Low Risk Adult  Completed   HPV VACCINES  Aged Out   Mammogram status: Completed 07/06/21. Repeat every year  Lung Cancer Screening: (Low Dose CT Chest recommended if Age 63-80 years, 30 pack-year currently smoking OR have quit w/in 15years.) does not qualify.   Vision Screening: Recommended annual ophthalmology exams for early detection of glaucoma and other disorders of the eye.  Dental Screening: Recommended annual dental exams for proper oral hygiene  Community Resource Referral / Chronic Care Management: CRR required this visit?  No   CCM required this visit?  No      Plan:     I have personally reviewed and noted the following in the patient's chart:   Medical and social history Use of alcohol, tobacco or illicit drugs  Current medications and supplements including opioid prescriptions. Not currently taking opioid.  Functional ability and status Nutritional status Physical activity Advanced directives List of other physicians Hospitalizations, surgeries, and ER visits in previous 12 months Vitals Screenings to include cognitive, depression, and falls Referrals and appointments  In addition, I have reviewed and discussed with patient certain preventive protocols, quality metrics, and best practice recommendations. A written personalized care plan for preventive services as well as general preventive health recommendations were provided to patient via mychart.     Varney Biles, LPN   [id number]

## 2021-07-26 DIAGNOSIS — R278 Other lack of coordination: Secondary | ICD-10-CM | POA: Diagnosis not present

## 2021-07-26 DIAGNOSIS — R4189 Other symptoms and signs involving cognitive functions and awareness: Secondary | ICD-10-CM | POA: Diagnosis not present

## 2021-07-26 DIAGNOSIS — Z741 Need for assistance with personal care: Secondary | ICD-10-CM | POA: Diagnosis not present

## 2021-07-26 DIAGNOSIS — F419 Anxiety disorder, unspecified: Secondary | ICD-10-CM | POA: Diagnosis not present

## 2021-07-26 DIAGNOSIS — G301 Alzheimer's disease with late onset: Secondary | ICD-10-CM | POA: Diagnosis not present

## 2021-07-28 DIAGNOSIS — R278 Other lack of coordination: Secondary | ICD-10-CM | POA: Diagnosis not present

## 2021-07-28 DIAGNOSIS — G301 Alzheimer's disease with late onset: Secondary | ICD-10-CM | POA: Diagnosis not present

## 2021-07-28 DIAGNOSIS — R4189 Other symptoms and signs involving cognitive functions and awareness: Secondary | ICD-10-CM | POA: Diagnosis not present

## 2021-07-28 DIAGNOSIS — Z741 Need for assistance with personal care: Secondary | ICD-10-CM | POA: Diagnosis not present

## 2021-07-28 DIAGNOSIS — F419 Anxiety disorder, unspecified: Secondary | ICD-10-CM | POA: Diagnosis not present

## 2021-08-02 ENCOUNTER — Telehealth: Payer: Self-pay

## 2021-08-02 DIAGNOSIS — F419 Anxiety disorder, unspecified: Secondary | ICD-10-CM | POA: Diagnosis not present

## 2021-08-02 DIAGNOSIS — R278 Other lack of coordination: Secondary | ICD-10-CM | POA: Diagnosis not present

## 2021-08-02 DIAGNOSIS — Z741 Need for assistance with personal care: Secondary | ICD-10-CM | POA: Diagnosis not present

## 2021-08-02 DIAGNOSIS — R4189 Other symptoms and signs involving cognitive functions and awareness: Secondary | ICD-10-CM | POA: Diagnosis not present

## 2021-08-02 DIAGNOSIS — G301 Alzheimer's disease with late onset: Secondary | ICD-10-CM | POA: Diagnosis not present

## 2021-08-02 NOTE — Telephone Encounter (Signed)
Signed OT evaluation and plan of care has been faxed and sent to scan

## 2021-08-03 DIAGNOSIS — Z741 Need for assistance with personal care: Secondary | ICD-10-CM | POA: Diagnosis not present

## 2021-08-03 DIAGNOSIS — R4189 Other symptoms and signs involving cognitive functions and awareness: Secondary | ICD-10-CM | POA: Diagnosis not present

## 2021-08-03 DIAGNOSIS — G301 Alzheimer's disease with late onset: Secondary | ICD-10-CM | POA: Diagnosis not present

## 2021-08-03 DIAGNOSIS — F419 Anxiety disorder, unspecified: Secondary | ICD-10-CM | POA: Diagnosis not present

## 2021-08-03 DIAGNOSIS — R278 Other lack of coordination: Secondary | ICD-10-CM | POA: Diagnosis not present

## 2021-08-05 ENCOUNTER — Other Ambulatory Visit: Payer: Self-pay | Admitting: Internal Medicine

## 2021-08-05 DIAGNOSIS — D490 Neoplasm of unspecified behavior of digestive system: Secondary | ICD-10-CM

## 2021-08-05 DIAGNOSIS — R1013 Epigastric pain: Secondary | ICD-10-CM

## 2021-08-05 DIAGNOSIS — K8689 Other specified diseases of pancreas: Secondary | ICD-10-CM

## 2021-08-09 DIAGNOSIS — R4189 Other symptoms and signs involving cognitive functions and awareness: Secondary | ICD-10-CM | POA: Diagnosis not present

## 2021-08-09 DIAGNOSIS — G301 Alzheimer's disease with late onset: Secondary | ICD-10-CM | POA: Diagnosis not present

## 2021-08-09 DIAGNOSIS — Z741 Need for assistance with personal care: Secondary | ICD-10-CM | POA: Diagnosis not present

## 2021-08-09 DIAGNOSIS — F419 Anxiety disorder, unspecified: Secondary | ICD-10-CM | POA: Diagnosis not present

## 2021-08-09 DIAGNOSIS — R278 Other lack of coordination: Secondary | ICD-10-CM | POA: Diagnosis not present

## 2021-08-11 DIAGNOSIS — R4189 Other symptoms and signs involving cognitive functions and awareness: Secondary | ICD-10-CM | POA: Diagnosis not present

## 2021-08-11 DIAGNOSIS — F419 Anxiety disorder, unspecified: Secondary | ICD-10-CM | POA: Diagnosis not present

## 2021-08-11 DIAGNOSIS — G301 Alzheimer's disease with late onset: Secondary | ICD-10-CM | POA: Diagnosis not present

## 2021-08-11 DIAGNOSIS — Z741 Need for assistance with personal care: Secondary | ICD-10-CM | POA: Diagnosis not present

## 2021-08-11 DIAGNOSIS — R278 Other lack of coordination: Secondary | ICD-10-CM | POA: Diagnosis not present

## 2021-08-16 DIAGNOSIS — R278 Other lack of coordination: Secondary | ICD-10-CM | POA: Diagnosis not present

## 2021-08-16 DIAGNOSIS — F419 Anxiety disorder, unspecified: Secondary | ICD-10-CM | POA: Diagnosis not present

## 2021-08-16 DIAGNOSIS — R4189 Other symptoms and signs involving cognitive functions and awareness: Secondary | ICD-10-CM | POA: Diagnosis not present

## 2021-08-16 DIAGNOSIS — Z741 Need for assistance with personal care: Secondary | ICD-10-CM | POA: Diagnosis not present

## 2021-08-16 DIAGNOSIS — G301 Alzheimer's disease with late onset: Secondary | ICD-10-CM | POA: Diagnosis not present

## 2021-08-18 DIAGNOSIS — F419 Anxiety disorder, unspecified: Secondary | ICD-10-CM | POA: Diagnosis not present

## 2021-08-18 DIAGNOSIS — R278 Other lack of coordination: Secondary | ICD-10-CM | POA: Diagnosis not present

## 2021-08-18 DIAGNOSIS — G301 Alzheimer's disease with late onset: Secondary | ICD-10-CM | POA: Diagnosis not present

## 2021-08-18 DIAGNOSIS — R4189 Other symptoms and signs involving cognitive functions and awareness: Secondary | ICD-10-CM | POA: Diagnosis not present

## 2021-08-18 DIAGNOSIS — Z741 Need for assistance with personal care: Secondary | ICD-10-CM | POA: Diagnosis not present

## 2021-08-25 DIAGNOSIS — R278 Other lack of coordination: Secondary | ICD-10-CM | POA: Diagnosis not present

## 2021-08-25 DIAGNOSIS — Z741 Need for assistance with personal care: Secondary | ICD-10-CM | POA: Diagnosis not present

## 2021-08-25 DIAGNOSIS — F419 Anxiety disorder, unspecified: Secondary | ICD-10-CM | POA: Diagnosis not present

## 2021-08-25 DIAGNOSIS — R4189 Other symptoms and signs involving cognitive functions and awareness: Secondary | ICD-10-CM | POA: Diagnosis not present

## 2021-08-25 DIAGNOSIS — G301 Alzheimer's disease with late onset: Secondary | ICD-10-CM | POA: Diagnosis not present

## 2021-08-30 DIAGNOSIS — G301 Alzheimer's disease with late onset: Secondary | ICD-10-CM | POA: Diagnosis not present

## 2021-08-30 DIAGNOSIS — Z741 Need for assistance with personal care: Secondary | ICD-10-CM | POA: Diagnosis not present

## 2021-08-30 DIAGNOSIS — R278 Other lack of coordination: Secondary | ICD-10-CM | POA: Diagnosis not present

## 2021-08-30 DIAGNOSIS — R4189 Other symptoms and signs involving cognitive functions and awareness: Secondary | ICD-10-CM | POA: Diagnosis not present

## 2021-08-30 DIAGNOSIS — F419 Anxiety disorder, unspecified: Secondary | ICD-10-CM | POA: Diagnosis not present

## 2021-09-01 DIAGNOSIS — R278 Other lack of coordination: Secondary | ICD-10-CM | POA: Diagnosis not present

## 2021-09-01 DIAGNOSIS — G301 Alzheimer's disease with late onset: Secondary | ICD-10-CM | POA: Diagnosis not present

## 2021-09-01 DIAGNOSIS — F419 Anxiety disorder, unspecified: Secondary | ICD-10-CM | POA: Diagnosis not present

## 2021-09-01 DIAGNOSIS — Z741 Need for assistance with personal care: Secondary | ICD-10-CM | POA: Diagnosis not present

## 2021-09-01 DIAGNOSIS — R4189 Other symptoms and signs involving cognitive functions and awareness: Secondary | ICD-10-CM | POA: Diagnosis not present

## 2021-09-02 DIAGNOSIS — Z23 Encounter for immunization: Secondary | ICD-10-CM | POA: Diagnosis not present

## 2021-09-06 DIAGNOSIS — G301 Alzheimer's disease with late onset: Secondary | ICD-10-CM | POA: Diagnosis not present

## 2021-09-06 DIAGNOSIS — Z741 Need for assistance with personal care: Secondary | ICD-10-CM | POA: Diagnosis not present

## 2021-09-06 DIAGNOSIS — F419 Anxiety disorder, unspecified: Secondary | ICD-10-CM | POA: Diagnosis not present

## 2021-09-06 DIAGNOSIS — R278 Other lack of coordination: Secondary | ICD-10-CM | POA: Diagnosis not present

## 2021-09-06 DIAGNOSIS — R4189 Other symptoms and signs involving cognitive functions and awareness: Secondary | ICD-10-CM | POA: Diagnosis not present

## 2021-09-08 DIAGNOSIS — F419 Anxiety disorder, unspecified: Secondary | ICD-10-CM | POA: Diagnosis not present

## 2021-09-08 DIAGNOSIS — G301 Alzheimer's disease with late onset: Secondary | ICD-10-CM | POA: Diagnosis not present

## 2021-09-08 DIAGNOSIS — Z741 Need for assistance with personal care: Secondary | ICD-10-CM | POA: Diagnosis not present

## 2021-09-08 DIAGNOSIS — R4189 Other symptoms and signs involving cognitive functions and awareness: Secondary | ICD-10-CM | POA: Diagnosis not present

## 2021-09-08 DIAGNOSIS — R278 Other lack of coordination: Secondary | ICD-10-CM | POA: Diagnosis not present

## 2021-09-12 DIAGNOSIS — R933 Abnormal findings on diagnostic imaging of other parts of digestive tract: Secondary | ICD-10-CM | POA: Diagnosis not present

## 2021-09-13 ENCOUNTER — Other Ambulatory Visit: Payer: Self-pay | Admitting: Gastroenterology

## 2021-09-13 DIAGNOSIS — R4189 Other symptoms and signs involving cognitive functions and awareness: Secondary | ICD-10-CM | POA: Diagnosis not present

## 2021-09-13 DIAGNOSIS — R278 Other lack of coordination: Secondary | ICD-10-CM | POA: Diagnosis not present

## 2021-09-13 DIAGNOSIS — G301 Alzheimer's disease with late onset: Secondary | ICD-10-CM | POA: Diagnosis not present

## 2021-09-13 DIAGNOSIS — F419 Anxiety disorder, unspecified: Secondary | ICD-10-CM | POA: Diagnosis not present

## 2021-09-13 DIAGNOSIS — Z741 Need for assistance with personal care: Secondary | ICD-10-CM | POA: Diagnosis not present

## 2021-09-14 ENCOUNTER — Other Ambulatory Visit: Payer: Self-pay | Admitting: Gastroenterology

## 2021-09-14 DIAGNOSIS — R933 Abnormal findings on diagnostic imaging of other parts of digestive tract: Secondary | ICD-10-CM

## 2021-09-15 ENCOUNTER — Other Ambulatory Visit (HOSPITAL_BASED_OUTPATIENT_CLINIC_OR_DEPARTMENT_OTHER): Payer: Self-pay | Admitting: Gastroenterology

## 2021-09-15 ENCOUNTER — Other Ambulatory Visit: Payer: Self-pay | Admitting: Gastroenterology

## 2021-09-15 DIAGNOSIS — R933 Abnormal findings on diagnostic imaging of other parts of digestive tract: Secondary | ICD-10-CM

## 2021-09-15 DIAGNOSIS — R4189 Other symptoms and signs involving cognitive functions and awareness: Secondary | ICD-10-CM | POA: Diagnosis not present

## 2021-09-15 DIAGNOSIS — F419 Anxiety disorder, unspecified: Secondary | ICD-10-CM | POA: Diagnosis not present

## 2021-09-15 DIAGNOSIS — Z741 Need for assistance with personal care: Secondary | ICD-10-CM | POA: Diagnosis not present

## 2021-09-15 DIAGNOSIS — R278 Other lack of coordination: Secondary | ICD-10-CM | POA: Diagnosis not present

## 2021-09-15 DIAGNOSIS — G301 Alzheimer's disease with late onset: Secondary | ICD-10-CM | POA: Diagnosis not present

## 2021-09-20 DIAGNOSIS — R278 Other lack of coordination: Secondary | ICD-10-CM | POA: Diagnosis not present

## 2021-09-20 DIAGNOSIS — R4189 Other symptoms and signs involving cognitive functions and awareness: Secondary | ICD-10-CM | POA: Diagnosis not present

## 2021-09-20 DIAGNOSIS — G301 Alzheimer's disease with late onset: Secondary | ICD-10-CM | POA: Diagnosis not present

## 2021-09-20 DIAGNOSIS — Z741 Need for assistance with personal care: Secondary | ICD-10-CM | POA: Diagnosis not present

## 2021-09-20 DIAGNOSIS — F419 Anxiety disorder, unspecified: Secondary | ICD-10-CM | POA: Diagnosis not present

## 2021-09-27 ENCOUNTER — Ambulatory Visit: Admission: RE | Admit: 2021-09-27 | Payer: Medicare Other | Source: Ambulatory Visit

## 2021-09-27 ENCOUNTER — Other Ambulatory Visit: Payer: Self-pay | Admitting: Gastroenterology

## 2021-09-27 DIAGNOSIS — Z90710 Acquired absence of both cervix and uterus: Secondary | ICD-10-CM

## 2021-09-28 ENCOUNTER — Telehealth: Payer: Self-pay

## 2021-09-28 NOTE — Telephone Encounter (Signed)
Confirmed signed and faxed patient care update form. Form has been sent to scan.

## 2021-10-05 ENCOUNTER — Ambulatory Visit
Admission: RE | Admit: 2021-10-05 | Discharge: 2021-10-05 | Disposition: A | Discharge status: Home / Self Care | Payer: Medicare Other | Source: Ambulatory Visit | Attending: Gastroenterology | Admitting: Gastroenterology

## 2021-10-05 DIAGNOSIS — R935 Abnormal findings on diagnostic imaging of other abdominal regions, including retroperitoneum: Secondary | ICD-10-CM | POA: Diagnosis not present

## 2021-10-05 DIAGNOSIS — R933 Abnormal findings on diagnostic imaging of other parts of digestive tract: Secondary | ICD-10-CM | POA: Diagnosis not present

## 2021-10-05 DIAGNOSIS — Z90710 Acquired absence of both cervix and uterus: Secondary | ICD-10-CM | POA: Insufficient documentation

## 2021-10-05 DIAGNOSIS — K862 Cyst of pancreas: Secondary | ICD-10-CM | POA: Diagnosis not present

## 2021-10-05 MED ORDER — GADOBUTROL 1 MMOL/ML IV SOLN
5.0000 mL | Freq: Once | INTRAVENOUS | Status: AC | PRN
  Administered 2021-10-05: 5 mL via INTRAVENOUS

## 2021-10-06 ENCOUNTER — Ambulatory Visit: Payer: Medicare Other | Admitting: Internal Medicine

## 2021-10-06 ENCOUNTER — Telehealth: Payer: Self-pay | Admitting: Internal Medicine

## 2021-10-06 NOTE — Telephone Encounter (Signed)
Patient no-showed today's appointment; provider notified for review of record. Letter sent to reschedule

## 2021-10-11 ENCOUNTER — Ambulatory Visit (INDEPENDENT_AMBULATORY_CARE_PROVIDER_SITE_OTHER): Payer: Medicare Other | Admitting: Internal Medicine

## 2021-10-11 ENCOUNTER — Other Ambulatory Visit: Payer: Self-pay

## 2021-10-11 ENCOUNTER — Encounter: Payer: Self-pay | Admitting: Internal Medicine

## 2021-10-11 VITALS — BP 136/92 | HR 70 | Temp 97.9°F | Ht 64.0 in | Wt 100.4 lb

## 2021-10-11 DIAGNOSIS — H538 Other visual disturbances: Secondary | ICD-10-CM | POA: Diagnosis not present

## 2021-10-11 DIAGNOSIS — Z1231 Encounter for screening mammogram for malignant neoplasm of breast: Secondary | ICD-10-CM

## 2021-10-11 DIAGNOSIS — K5900 Constipation, unspecified: Secondary | ICD-10-CM

## 2021-10-11 DIAGNOSIS — R0789 Other chest pain: Secondary | ICD-10-CM | POA: Diagnosis not present

## 2021-10-11 DIAGNOSIS — R030 Elevated blood-pressure reading, without diagnosis of hypertension: Secondary | ICD-10-CM

## 2021-10-11 DIAGNOSIS — Z13220 Encounter for screening for lipoid disorders: Secondary | ICD-10-CM

## 2021-10-11 DIAGNOSIS — F419 Anxiety disorder, unspecified: Secondary | ICD-10-CM

## 2021-10-11 DIAGNOSIS — F03A18 Unspecified dementia, mild, with other behavioral disturbance: Secondary | ICD-10-CM | POA: Diagnosis not present

## 2021-10-11 DIAGNOSIS — Z1389 Encounter for screening for other disorder: Secondary | ICD-10-CM | POA: Diagnosis not present

## 2021-10-11 DIAGNOSIS — R519 Headache, unspecified: Secondary | ICD-10-CM

## 2021-10-11 DIAGNOSIS — E785 Hyperlipidemia, unspecified: Secondary | ICD-10-CM

## 2021-10-11 MED ORDER — SENNA-DOCUSATE SODIUM 8.6-50 MG PO TABS: 1.0000 | ORAL_TABLET | Freq: Every day | ORAL | 3 refills | Status: DC | PRN

## 2021-10-11 NOTE — Progress Notes (Signed)
Chief Complaint  Patient presents with   Follow-up   F/u  1. 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 Right sided pain in head on and off x 1.5 weeks and blurry vision -will have her f/u neurology. She does report increased stress at [facility] place slightly and frustration since items were lost in the move and pain is shooting into right eye she does ask for pain medication and takes prn otc meds at [facility] which helps. BP sl elevated today 136/92 will ask they monitor BP MRI brain 03/07/21  IMPRESSION: 1. No acute intracranial abnormality. 2. Scattered foci of T2 hyperintensity within the white matter of the cerebral hemisphere, nonspecific, most likely related to chronic small vessel ischemia. 3. Scattered foci of susceptibility artifact within the right temporal occipital and posterior parietal region, consistent with prior micro hemorrhages which may be related to amyloid angiopathy.     Electronically Signed   By: Pedro Earls M.D.   On: 03/07/2021 15:35 3. Atypical chest pain left sided will have pt f/u cards Dr. Rockey Situ further w/u I.e stress test etc 4. C/o lower ab pain MRI 10/05/21 +large stool burden she has celery to eat pain was right lower and left lower x 3-4 x disc otc senna s and warm prune juice  5. Wt is gaining from 92 to 100 lbs since move to [facility]   Review of Systems  Constitutional:  Negative for weight loss.  HENT:  Negative for hearing loss.   Eyes:  Positive for blurred vision.  Respiratory:  Negative for shortness of breath.   Cardiovascular:  Positive for chest pain.  Skin:  Negative for rash.  Neurological:  Positive for headaches.  Psychiatric/Behavioral:  Negative for depression.   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 +   Past Surgical History:  Procedure Laterality Date   ABDOMINAL HYSTERECTOMY     DUB, endometriosis no h/o abnormal pap    APPENDECTOMY     COLONOSCOPY N/A 04/30/2015   Procedure: COLONOSCOPY;  Surgeon: Josefine Class, MD;  Location: [facility] ENDOSCOPY;  Service: Endoscopy;  Laterality: N/A;   TONSILLECTOMY     Family History  Problem Relation Age of Onset   Stroke Mother    Cancer Father        prostate    Pneumonia Maternal Grandmother    Hip fracture Maternal Grandmother    Stroke Paternal Grandfather    Skin cancer Other    Breast cancer Neg Hx    Social History   Socioeconomic History   Marital status: Widowed    Spouse name: Not on file   Number of children: Not on file   Years of education: Not on file   Highest education level: Not on file  Occupational History   Not on file  Tobacco Use   Smoking status: Never   Smokeless tobacco: Never  Vaping Use   Vaping Use: Never used  Substance and Sexual Activity   Alcohol use: Not Currently   Drug use: Not Currently   Sexual activity:  Not Currently    Partners: Male  Other Topics Concern   Not on file  Social History Narrative   Widowed    Grew up in [place] moved around when husband living    No kids    Lives [facility]    Never smoker    Likes to pain for [place]    Enjoys plays    No guns    Wears seat belt, safe in relationship   Retired Teacher, 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    Social Determinants of Radio broadcast assistant Strain: Low Risk    Difficulty of Paying Living Expenses: Not hard at all  Food Insecurity: No Food Insecurity   Worried About Charity fundraiser in the Last Year: Never true   Arboriculturist in the Last Year: Never true  Transportation Needs: No Transportation Needs   Lack of Transportation (Medical): No   Lack of Transportation  (Non-Medical): No  Physical Activity: Unknown   Days of Exercise per Week: 0 days   Minutes of Exercise per Session: Not on file  Stress: No Stress Concern Present   Feeling of Stress : Not at all  Social Connections: Unknown   Frequency of Communication with Friends and Family: More than three times a week   Frequency of Social Gatherings with Friends and Family: More than three times a week   Attends Religious Services: Not on file   Active Member of Clubs or Organizations: Not on file   Attends Archivist Meetings: Not on file   Marital Status: Widowed  Human resources officer Violence: Not At Risk   Fear of Current or Ex-Partner: No   Emotionally Abused: No   Physically Abused: No   Sexually Abused: No   Current Meds  Medication Sig   memantine (NAMENDA) 5 MG tablet Take 1 pill daily x 1 week then increase bid Per Dr. Manuella Ghazi neurology. Can you bubble pack?   mirtazapine (REMERON) 7.5 MG tablet Take 1 tablet (7.5 mg total) by mouth at bedtime.   multivitamin-lutein (OCUVITE-LUTEIN) CAPS capsule Take 1 capsule by mouth every other day.   omeprazole (PRILOSEC) 40 MG capsule Take 1 capsule (40 mg total) by mouth daily. 30 minutes before dinner   sennosides-docusate sodium (SENOKOT-S) 8.6-50 MG tablet Take 1 tablet by mouth daily as needed for constipation.   triamcinolone cream (KENALOG) 0.1 % Apply 1 application topically 2 (two) times daily. Right upper back as needed   Allergies  Allergen Reactions   Amoxil [Amoxicillin]    Tylenol [Acetaminophen]     ?reaction     Ampicillin Rash   Elemental Sulfur Rash   Keflex [Cephalexin] Rash   Macrobid [Nitrofurantoin] Rash   Ofloxacin Rash   Penicillins Rash   No results found for this or any previous visit (from the past 2160 hour(s)). Objective  Body mass index is 17.23 kg/m. Wt Readings from Last 3 Encounters:  10/11/21 100 lb 6.4 oz (45.5 kg)  07/25/21 99 lb 3.2 oz (45 kg)  07/06/21 92 lb (41.7 kg)   Temp Readings from  Last 3 Encounters:  10/11/21 97.9 F (36.6 C) (Temporal)  07/06/21 97.7 F (36.5 C) (Temporal)  05/10/21 98 F (36.7 C) (Oral)   BP Readings from Last 3 Encounters:  10/11/21 (!) 136/92  07/06/21 116/70  05/10/21 106/70   Pulse Readings from Last 3 Encounters:  10/11/21 70  07/06/21 71  05/10/21 75  Physical Exam Vitals and nursing note reviewed.  Constitutional:      Appearance: Normal appearance. She is well-developed and well-groomed.  HENT:     Head: Normocephalic and atraumatic.  Eyes:     Conjunctiva/sclera: Conjunctivae normal.     Pupils: Pupils are equal, round, and reactive to light.  Cardiovascular:     Rate and Rhythm: Normal rate and regular rhythm.     Heart sounds: Normal heart sounds. No murmur heard. Pulmonary:     Effort: Pulmonary effort is normal.     Breath sounds: Normal breath sounds.  Chest:     Chest wall: No tenderness.  Abdominal:     General: Abdomen is flat. Bowel sounds are normal.     Tenderness: There is no abdominal tenderness.  Skin:    General: Skin is warm and dry.  Neurological:     General: No focal deficit present.     Mental Status: She is alert and oriented to person, place, and time. Mental status is at baseline.     Gait: Gait normal.  Psychiatric:        Attention and Perception: Attention and perception normal.        Mood and Affect: Mood and affect normal.        Speech: Speech normal.        Behavior: Behavior normal. Behavior is cooperative.        Thought Content: Thought content normal.        Cognition and Memory: Cognition and memory normal.        Judgment: Judgment normal.    Assessment  Plan  Constipation, unspecified constipation type - Plan: sennosides-docusate sodium (SENOKOT-S) 8.6-50 MG tablet, TSH  Elevated blood pressure reading - Plan: Comprehensive metabolic panel, Lipid panel, CBC with Differential/Platelet, TSH Check BP weekly [facility] and cc me readings  F/u cards and for  atypical chest pain  Fasting labs [facility] given [facility] order today  Atypical chest pain-f/u Dr. Rockey Situ  Acute nonintractable headache with blurry vision Memory dementia  MRI 03/05/21 reviewe F/u Dr. Manuella Ghazi  Anxiety  Consider therapy and [facility]   HM Flu shot utd pna 23 had 09/11/15 prevnar utd  Given Rx shingrix prev Rx 2/2 given Tdap had 10/11/12 zostervax had  Hep C neg 01/07/19  Hep B not immune, hep A immune 01/07/19 covid vx had 4/4  Never smoker   No need for pap s/p hysterectomy DUB, endometriosis   Mammogram 01/07/21 NEGATIVE ordered    A1C 5.6  dexa had 11/15/2017 +osteoporosis -2.9 left femur will repeat  ordered  -Pt never took fosamax disc prolia prev.  declines 2/2 c/w side effects    Colonoscopy h/o polyp tubular 05/03/15  Had repeat EGD/colonoscopy 06/17/2019 [facility] Dr. 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   Technique: Precontrast and dynamic postcontrast MR imaging of the abdomen  was performed using the  Liver Protocol. IV contrast was administered to  improve disease detection and further define anatomy. MRCP was also  performed, with 3-D reconstruction performed to evaluate biliary anatomy.   Premedication/adverse events:  None.   FINDINGS: No free air, free fluid or fluid collection.   Lower chest: The visualized lungs are aerated. No pleural or pericardial  effusion.   ABDOMEN:   Liver: The  liver enhances homogeneously without suspicious mass.  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   Gallbladder and biliary: Normal gallbladder without cholelithiasis. Normal  caliber bile ducts.   Spleen: Normal spleen.   Pancreas: Cystic lesion in the pancreatic head/neck measures 2.0 cm with  intrinsic enhancing septations, unchanged from prior exam (series 4, image  15). No intrinsic enhancing nodularity. Normal caliber main pancreatic  duct. Additional adjacent subcentimeter lesion is unchanged.   Adrenal glands: Normal adrenal glands.   Kidneys and ureters: Normal kidneys and ureters.   GI tract: The stomach is decompressed and poorly evaluated. Normal caliber  small bowel and colon.   Vascular structures: Normal caliber abdominal aorta. The celiac axis, SMA,  bilateral renal arteries, and IMA are patent. Portal and mesenteric venous  structures are patent.   Lymph nodes: No abdominal lymphadenopathy.   Bones: Unremarkable.   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 Normal caliber main pancreatic duct. Continued attention on  follow-up imaging is recommended.    MRI/MRCP 10/05/21   Provider: Dr. Olivia Mackie McLean-Scocuzza-Internal Medicine

## 2021-10-11 NOTE — Patient Instructions (Addendum)
Senna S (senna laxative + colace) daily as needed  Warm prune juice   Nonspecific Chest Pain, Adult Chest pain is an uncomfortable, tight, or painful feeling in the chest. The pain can feel like a crushing, aching, or squeezing pressure. A person can feel a burning or tingling sensation. Chest pain can also be felt in your back, neck, jaw, shoulder, or arm. This pain can be worse when you move, sneeze, or take a deep breath. Chest pain can be caused by a condition that is life-threatening. This must be treated right away. It can also be caused by something that is not life-threatening. If you have chest pain, it can be hard to know the difference, so it is important to get help right away to make sure that you do not have a serious condition. Some life-threatening causes of chest pain include: Heart attack. A tear in the body's main blood vessel (aortic dissection). Inflammation around your heart (pericarditis). A problem in the lungs, such as a blood clot (pulmonary embolism) or a collapsed lung (pneumothorax). Some non life-threatening causes of chest pain include: Heartburn. Anxiety or stress. Damage to the bones, muscles, and cartilage that make up your chest wall. Pneumonia or bronchitis. Shingles infection (varicella-zoster virus). Your chest pain may come and go. It may also be constant. Your health care provider will do tests and other studies to find the cause of your pain. Treatment will depend on the cause of your chest pain. Follow these instructions at home: Medicines Take over-the-counter and prescription medicines only as told by your health care provider. If you were prescribed an antibiotic medicine, take it as told by your health care provider. Do not stop taking the antibiotic even if you start to feel better. Activity Avoid any activities that cause chest pain. Do not lift anything that is heavier than 10 lb (4.5 kg), or the limit that you are told, until your health care  provider says that it is safe. Rest as directed by your health care provider. Return to your normal activities only as told by your health care provider. Ask your health care provider what activities are safe for you. Lifestyle   Do not use any products that contain nicotine or tobacco, such as cigarettes, e-cigarettes, and chewing tobacco. If you need help quitting, ask your health care provider. Do not drink alcohol. Make healthy lifestyle changes as recommended. These may include: Getting regular exercise. Ask your health care provider to suggest some exercises that are safe for you. Eating a heart-healthy diet. This includes plenty of fresh fruits and vegetables, whole grains, low-fat (lean) protein, and low-fat dairy products. A dietitian can help you find healthy eating options. Maintaining a healthy weight. Managing any other health conditions you may have, such as high blood pressure (hypertension) or diabetes. Reducing stress, such as with yoga or relaxation techniques. General instructions Pay attention to any changes in your symptoms. It is up to you to get the results of any tests that were done. Ask your health care provider, or the department that is doing the tests, when your results will be ready. Keep all follow-up visits as told by your health care provider. This is important. You may be asked to go for further testing if your chest pain does not go away. Contact a health care provider if: Your chest pain does not go away. You feel depressed. You have a fever. You notice changes in your symptoms or develop new symptoms. Get help right away if: Your  chest pain gets worse. You have a cough that gets worse, or you cough up blood. You have severe pain in your abdomen. You faint. You have sudden, unexplained chest discomfort. You have sudden, unexplained discomfort in your arms, back, neck, or jaw. You have shortness of breath at any time. You suddenly start to sweat, or  your skin gets clammy. You feel nausea or you vomit. You suddenly feel lightheaded or dizzy. You have severe weakness, or unexplained weakness or fatigue. Your heart begins to beat quickly, or it feels like it is skipping beats. These symptoms may represent a serious problem that is an emergency. Do not wait to see if the symptoms will go away. Get medical help right away. Call your local emergency services (911 in the [place] Do not drive yourself to the hospital. Summary Chest pain can be caused by a condition that is serious and requires urgent treatment. It may also be caused by something that is not life-threatening. Your health care provider may do lab tests and other studies to find the cause of your pain. Follow your health care provider's instructions on taking medicines, making lifestyle changes, and getting emergency treatment if symptoms become worse. Keep all follow-up visits as told by your health care provider. This includes visits for any further testing if your chest pain does not go away. This information is not intended to replace advice given to you by your health care provider. Make sure you discuss any questions you have with your health care provider. Document Revised: 02/10/2021 Document Reviewed: 02/10/2021 Elsevier Patient Education  2022 [place].  Constipation, Adult Constipation is when a person has fewer than three bowel movements in a week, has difficulty having a bowel movement, or has stools (feces) that are dry, hard, or larger than normal. Constipation may be caused by an underlying condition. It may become worse with age if a person takes certain medicines and does not take in enough fluids. Follow these instructions at home: Eating and drinking  Eat foods that have a lot of fiber, such as beans, whole grains, and fresh fruits and vegetables. Limit foods that are low in fiber and high in fat and processed sugars, such as fried or sweet foods. These include  french fries, hamburgers, cookies, candies, and soda. Drink enough fluid to keep your urine pale yellow. General instructions Exercise regularly or as told by your health care provider. Try to do 150 minutes of moderate exercise each week. Use the bathroom when you have the urge to go. Do not hold it in. Take over-the-counter and prescription medicines only as told by your health care provider. This includes any fiber supplements. During bowel movements: Practice deep breathing while relaxing the lower abdomen. Practice pelvic floor relaxation. Watch your condition for any changes. Let your health care provider know about them. Keep all follow-up visits as told by your health care provider. This is important. Contact a health care provider if: You have pain that gets worse. You have a fever. You do not have a bowel movement after 4 days. You vomit. You are not hungry or you lose weight. You are bleeding from the opening between the buttocks (anus). You have thin, pencil-like stools. Get help right away if: You have a fever and your symptoms suddenly get worse. You leak stool or have blood in your stool. Your abdomen is bloated. You have severe pain in your abdomen. You feel dizzy or you faint. Summary Constipation is when a person has fewer than  three bowel movements in a week, has difficulty having a bowel movement, or has stools (feces) that are dry, hard, or larger than normal. Eat foods that have a lot of fiber, such as beans, whole grains, and fresh fruits and vegetables. Drink enough fluid to keep your urine pale yellow. Take over-the-counter and prescription medicines only as told by your health care provider. This includes any fiber supplements. This information is not intended to replace advice given to you by your health care provider. Make sure you discuss any questions you have with your health care provider. Document Revised: 10/15/2019 Document Reviewed:  10/15/2019 Elsevier Patient Education  [place].

## 2021-10-12 DIAGNOSIS — F39 Unspecified mood [affective] disorder: Secondary | ICD-10-CM | POA: Insufficient documentation

## 2021-10-12 DIAGNOSIS — F419 Anxiety disorder, unspecified: Secondary | ICD-10-CM | POA: Insufficient documentation

## 2021-10-12 DIAGNOSIS — F03A0 Unspecified dementia, mild, without behavioral disturbance, psychotic disturbance, mood disturbance, and anxiety: Secondary | ICD-10-CM | POA: Insufficient documentation

## 2021-10-12 DIAGNOSIS — H538 Other visual disturbances: Secondary | ICD-10-CM | POA: Insufficient documentation

## 2021-10-12 DIAGNOSIS — R519 Headache, unspecified: Secondary | ICD-10-CM | POA: Insufficient documentation

## 2021-10-12 DIAGNOSIS — R0789 Other chest pain: Secondary | ICD-10-CM | POA: Insufficient documentation

## 2021-10-17 DIAGNOSIS — G309 Alzheimer's disease, unspecified: Secondary | ICD-10-CM | POA: Diagnosis not present

## 2021-10-17 DIAGNOSIS — F0280 Dementia in other diseases classified elsewhere without behavioral disturbance: Secondary | ICD-10-CM | POA: Diagnosis not present

## 2021-10-17 DIAGNOSIS — F0150 Vascular dementia without behavioral disturbance: Secondary | ICD-10-CM | POA: Diagnosis not present

## 2021-10-17 NOTE — Progress Notes (Signed)
Faxed to both offices via epic routing

## 2021-10-18 DIAGNOSIS — R300 Dysuria: Secondary | ICD-10-CM | POA: Diagnosis not present

## 2021-10-18 DIAGNOSIS — K599 Functional intestinal disorder, unspecified: Secondary | ICD-10-CM | POA: Diagnosis not present

## 2021-10-18 DIAGNOSIS — R030 Elevated blood-pressure reading, without diagnosis of hypertension: Secondary | ICD-10-CM | POA: Diagnosis not present

## 2021-10-18 DIAGNOSIS — E785 Hyperlipidemia, unspecified: Secondary | ICD-10-CM | POA: Diagnosis not present

## 2021-10-18 DIAGNOSIS — R309 Painful micturition, unspecified: Secondary | ICD-10-CM | POA: Diagnosis not present

## 2021-10-20 DIAGNOSIS — R030 Elevated blood-pressure reading, without diagnosis of hypertension: Secondary | ICD-10-CM | POA: Diagnosis not present

## 2021-10-20 DIAGNOSIS — E785 Hyperlipidemia, unspecified: Secondary | ICD-10-CM | POA: Diagnosis not present

## 2021-10-20 DIAGNOSIS — K599 Functional intestinal disorder, unspecified: Secondary | ICD-10-CM | POA: Diagnosis not present

## 2021-10-21 ENCOUNTER — Other Ambulatory Visit: Payer: Self-pay | Admitting: Internal Medicine

## 2021-10-21 DIAGNOSIS — K5900 Constipation, unspecified: Secondary | ICD-10-CM

## 2021-10-21 MED ORDER — SENNA-DOCUSATE SODIUM 8.6-50 MG PO TABS: 1.0000 | ORAL_TABLET | ORAL | 3 refills | Status: DC

## 2021-10-24 ENCOUNTER — Other Ambulatory Visit: Payer: Self-pay

## 2021-10-24 ENCOUNTER — Telehealth: Payer: Self-pay | Admitting: Internal Medicine

## 2021-10-24 DIAGNOSIS — N3001 Acute cystitis with hematuria: Secondary | ICD-10-CM

## 2021-10-24 MED ORDER — FOSFOMYCIN TROMETHAMINE 3 G PO PACK: 3.0000 g | PACK | Freq: Once | ORAL | 0 refills | Status: DC

## 2021-10-24 MED ORDER — FOSFOMYCIN TROMETHAMINE 3 G PO PACK: 3.0000 g | PACK | Freq: Once | ORAL | 0 refills | Status: AC

## 2021-10-24 NOTE — Telephone Encounter (Signed)
This is ok  Thank you both for strong/hard work

## 2021-10-24 NOTE — Telephone Encounter (Signed)
Called and spoke with the nurse at [facility] and states they are not aware of any antibiotic Patient has been able to tolerate in the past. Verified that they have the same medications on her allergy that we have.   Patient unable to answer the question as she has dementia.   Okay to send in [facility] to [facility]. They ask that this be done soon as the Patient is miserable.

## 2021-10-24 NOTE — Telephone Encounter (Signed)
Was able to go upfront and get the fax of Patient's results including the UA and Culture.   Director of [facility] states that the Patient has been miserable for the last 3 days and needs an antibiotic called in to [facility] pharmacy.   Culture report with sensitivities given to Mable Paris, provider on call today as Dr Olivia Mackie McLean-Scocuzza not in office. Director of [facility] made aware of this.   Please advise

## 2021-10-24 NOTE — Telephone Encounter (Signed)
[place],   I sent in monurol Please advise to start probiotics and monitor for worsening symptoms, changes in mentation, fever or chills.  All of which would warrant emergency room evaluation

## 2021-10-24 NOTE — Telephone Encounter (Signed)
Luellen Pucker from [facility] called, [phone].Luellen Pucker is asking for a antibiotic for patient. She is faxing labs and request.

## 2021-10-24 NOTE — Telephone Encounter (Signed)
Nurse informed of the below

## 2021-10-24 NOTE — Telephone Encounter (Signed)
Director of [facility] calling stating she sent labs orders last week. The labs indicated that the patient has a UTI. Needing something done today.

## 2021-10-24 NOTE — Addendum Note (Signed)
Addended by: Burnard Hawthorne on: 10/24/2021 02:23 PM   Modules accepted: Orders

## 2021-10-24 NOTE — Telephone Encounter (Signed)
FYI dr Aundra Dubin  Call caregiver and pt  UA is positive for blood, white blood cells.  Please reorder urinalysis and schedule this in 3 or 4 weeks we can ensure blood has resolved.  Urine culture positive for for E. Coli.  She has many antibiotic drug allergies including cephalosporin, penicillin, sulfar, macrobid.   Please ask if she has had any recent antibiotics as I have reviewed chart and do not see any listed. I am considering going with a one time dose antiseptic of monurol as I dont see listed as an allergy.   Please get more information first regarding allergies per above. Appears rash occurs.  On fax it was mentioned prescribing senna as scheduled versus as needed.  Dr Aundra Dubin sent in senna scheduled every 3 days.  Please see prescription as it is scheduled and has not prescribed as needed.    Advise caregiver that I will give labs to Dr Aundra Dubin for her review as cholesterol and bilirubin remain elevated.   Please Clarify for caregiver

## 2021-11-01 DIAGNOSIS — F419 Anxiety disorder, unspecified: Secondary | ICD-10-CM | POA: Diagnosis not present

## 2021-11-01 DIAGNOSIS — Z741 Need for assistance with personal care: Secondary | ICD-10-CM | POA: Diagnosis not present

## 2021-11-01 DIAGNOSIS — R278 Other lack of coordination: Secondary | ICD-10-CM | POA: Diagnosis not present

## 2021-11-01 DIAGNOSIS — R4189 Other symptoms and signs involving cognitive functions and awareness: Secondary | ICD-10-CM | POA: Diagnosis not present

## 2021-11-01 DIAGNOSIS — G301 Alzheimer's disease with late onset: Secondary | ICD-10-CM | POA: Diagnosis not present

## 2021-11-07 ENCOUNTER — Ambulatory Visit: Payer: Medicare Other | Admitting: Internal Medicine

## 2021-11-07 ENCOUNTER — Encounter: Payer: Self-pay | Admitting: Internal Medicine

## 2021-11-07 VITALS — BP 117/69 | HR 76 | Temp 98.6°F | Resp 20 | Wt 97.6 lb

## 2021-11-07 DIAGNOSIS — I680 Cerebral amyloid angiopathy: Secondary | ICD-10-CM | POA: Diagnosis not present

## 2021-11-07 DIAGNOSIS — F0280 Dementia in other diseases classified elsewhere without behavioral disturbance: Secondary | ICD-10-CM

## 2021-11-07 DIAGNOSIS — E854 Organ-limited amyloidosis: Secondary | ICD-10-CM

## 2021-11-07 DIAGNOSIS — G309 Alzheimer's disease, unspecified: Secondary | ICD-10-CM

## 2021-11-07 DIAGNOSIS — K219 Gastro-esophageal reflux disease without esophagitis: Secondary | ICD-10-CM

## 2021-11-07 DIAGNOSIS — I700 Atherosclerosis of aorta: Secondary | ICD-10-CM | POA: Diagnosis not present

## 2021-11-07 DIAGNOSIS — E441 Mild protein-calorie malnutrition: Secondary | ICD-10-CM | POA: Diagnosis not present

## 2021-11-07 DIAGNOSIS — F39 Unspecified mood [affective] disorder: Secondary | ICD-10-CM | POA: Diagnosis not present

## 2021-11-07 MED ORDER — OMEPRAZOLE 20 MG PO CPDR: 20.0000 mg | DELAYED_RELEASE_CAPSULE | Freq: Every day | ORAL | 11 refills | Status: DC

## 2021-11-07 NOTE — Assessment & Plan Note (Signed)
Per neurologist No action for this

## 2021-11-07 NOTE — Assessment & Plan Note (Signed)
Expected adjustment disorder with her recent move---she feels she has worked through this mostly Chronic mild anxiety--no longer on xanax

## 2021-11-07 NOTE — Progress Notes (Signed)
Subjective:    Patient ID: Evelyn Barker, female    DOB: 07-24-1946, 75 y.o.   MRN: [id number]  HPI Visit in her assisted living apartment to establish care. 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 as well  Independent with ADLs Reluctant to bathe---she needs to be encouraged Generally continent----rare urine incontinence Dresses herself  Has lost weight in the past year Now on mirtazapine and has prepared meals now Weight is up about 10# since the spring  Had "spell" of being "moody"---associated with recent move Adjusting to the situation overall---"I have put it behind me" No depression No sig change in lifelong anxiety---no longer on xanax  No heartburn or dysphagia Doesn't remember being on omeprazole  Current Outpatient Medications on File Prior to Visit  Medication Sig Dispense Refill   memantine (NAMENDA) 5 MG tablet Take 1 pill daily x 1 week then increase bid Per Dr. Manuella Ghazi neurology. Can you bubble pack? 180 tablet 3   mirtazapine (REMERON) 7.5 MG tablet Take 1 tablet (7.5 mg total) by mouth at bedtime. 90 tablet 3   multivitamin-lutein (OCUVITE-LUTEIN) CAPS capsule Take 1 capsule by mouth every other day.     omeprazole (PRILOSEC) 40 MG capsule Take 1 capsule (40 mg total) by mouth daily. 30 minutes before dinner 90 capsule 3   sennosides-docusate sodium (SENOKOT-S) 8.6-50 MG tablet Take 1 tablet by mouth every 3 (three) days. 30 tablet 3   No current facility-administered medications on file prior to visit.    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 +    Past Surgical History:  Procedure Laterality Date   ABDOMINAL HYSTERECTOMY     DUB, endometriosis no h/o abnormal pap    APPENDECTOMY     COLONOSCOPY N/A 04/30/2015   Procedure: COLONOSCOPY;  Surgeon: Josefine Class, MD;  Location: [facility] ENDOSCOPY;  Service: Endoscopy;  Laterality: N/A;   TONSILLECTOMY      Family History  Problem Relation Age of Onset   Stroke Mother    Cancer Father        prostate    Pneumonia Maternal Grandmother    Hip fracture Maternal Grandmother    Stroke Paternal Grandfather    Skin cancer Other    Breast cancer Neg Hx     Social History   Socioeconomic History   Marital status: Widowed    Spouse name: Not on file   Number of children: Not on file   Years of education: Not on file   Highest education level: Not on file  Occupational History   Occupation: Network engineer    Comment: Retired  Tobacco Use   Smoking status: Never   Smokeless tobacco: Never  Vaping Use   Vaping  Use: Never used  Substance and Sexual Activity   Alcohol use: Not Currently   Drug use: Not Currently   Sexual activity: Not Currently    Partners: Male  Other Topics Concern   Not on file  Social History Narrative   Widowed    Grew up in [place] moved around when husband living    1 adopted son---died 03/21/09   Retired Network engineer       Social Determinants of Radio broadcast assistant Strain: Low Risk    Difficulty of Paying Living Expenses: Not hard at all  Food Insecurity: No Food Insecurity   Worried About Charity fundraiser in the Last Year: Never true   Arboriculturist in the Last Year: Never true  Transportation Needs: No Transportation Needs   Lack of Transportation (Medical): No   Lack of Transportation (Non-Medical): No   Physical Activity: Unknown   Days of Exercise per Week: 0 days   Minutes of Exercise per Session: Not on file  Stress: No Stress Concern Present   Feeling of Stress : Not at all  Social Connections: Unknown   Frequency of Communication with Friends and Family: More than three times a week   Frequency of Social Gatherings with Friends and Family: More than three times a week   Attends Religious Services: Not on file   Active Member of Clubs or Organizations: Not on file   Attends Archivist Meetings: Not on file   Marital Status: Widowed  Human resources officer Violence: Not At Risk   Fear of Current or Ex-Partner: No   Emotionally Abused: No   Physically Abused: No   Sexually Abused: No   Review of Systems Sleeps well Bowels are moving fine Some right hip pain---no meds needed Some itching on back of right shoulder---seems better now (had some cream in the past)     Objective:   Physical Exam Cardiovascular:     Rate and Rhythm: Normal rate and regular rhythm.     Heart sounds: No murmur heard.   No gallop.  Pulmonary:     Effort: Pulmonary effort is normal.     Breath sounds: Normal breath sounds. No wheezing or rales.  Abdominal:     Palpations: Abdomen is soft.     Tenderness: There is no abdominal tenderness.  Musculoskeletal:     Cervical back: Neck supple.     Right lower leg: No edema.     Left lower leg: No edema.  Lymphadenopathy:     Cervical: No cervical adenopathy.  Neurological:     Mental Status: She is alert.  Psychiatric:        Mood and Affect: Mood normal.        Behavior: Behavior normal.           Assessment & Plan:

## 2021-11-07 NOTE — Assessment & Plan Note (Signed)
Has gained back ~10 #  Will continue the mirtazapine for now

## 2021-11-07 NOTE — Assessment & Plan Note (Signed)
No symptoms  Will try decreasing omeprazole to 20mg  daily

## 2021-11-07 NOTE — Assessment & Plan Note (Signed)
On imaging Would not give statin at this point

## 2021-11-07 NOTE — Assessment & Plan Note (Signed)
Mild Doing okay for now with the extra support at [facility] assisted living Still independent with ADLs--but needs cueing Is on low dose memantine

## 2021-11-07 NOTE — Progress Notes (Signed)
Cardiology Office Note  Date:  11/08/2021   ID:  Evelyn Barker, DOB 1946-08-08, MRN [id number]  PCP:  Venia Carbon, MD   Chief Complaint  Patient presents with   Follow-up    "Doing well." Medications reviewed by the patient's medication list from [facility] assisted living.     HPI:  Ms. Evelyn Barker is a 75 year old woman with past medical history of Anxiety Unintentional weight loss Fatty liver GERD Constipation Pancreatic cyst Who presents for f/u of her chest discomfort  Last seen by myself in clinic January 2021, Lives at [facility], presents with caretaker Trying to stay active, having some balance issues Denies any recent falls  On prior clinic visit, had some stressors, anxiety, getting affairs in order Had some chest discomfort Issues have much improved  No chest pain on exertion Weight trending up several pounds  Lab work reviewed total cholesterol 201, LDL 107, creatinine 0.77 normal CBC  EKG personally reviewed by myself on todays visit Normal sinus rhythm rate 60 bpm no significant ST-T wave changes  Other past medical history reviewed CT abdomen pelvis May 2020,  no aortic atherosclerosis  PET scan August 2020 images pulled up and reviewed with her today minimal aortic atherosclerosis, no coronary calcification   PMH:   has a past medical history of Anxiety, Arthritis, History of chicken pox, History of macular degeneration, History of shingles, Osteoporosis, Osteoporosis, and Seropositive for Lyme disease.  PSH:    Past Surgical History:  Procedure Laterality Date   ABDOMINAL HYSTERECTOMY     DUB, endometriosis no h/o abnormal pap    APPENDECTOMY     COLONOSCOPY N/A 04/30/2015   Procedure: COLONOSCOPY;  Surgeon: Josefine Class, MD;  Location: [facility] ENDOSCOPY;  Service: Endoscopy;  Laterality: N/A;   TONSILLECTOMY      Current Outpatient Medications  Medication Sig Dispense Refill   memantine (NAMENDA) 5 MG tablet Take 1 pill daily x 1  week then increase bid Per Dr. Manuella Ghazi neurology. Can you bubble pack? 180 tablet 3   mirtazapine (REMERON) 7.5 MG tablet Take 1 tablet (7.5 mg total) by mouth at bedtime. 90 tablet 3   multivitamin-lutein (OCUVITE-LUTEIN) CAPS capsule Take 1 capsule by mouth every other day.     omeprazole (PRILOSEC) 20 MG capsule Take 1 capsule (20 mg total) by mouth daily. 30 capsule 11   sennosides-docusate sodium (SENOKOT-S) 8.6-50 MG tablet Take 1 tablet by mouth every 3 (three) days. 30 tablet 3   No current facility-administered medications for this visit.     Allergies:   Amoxil [amoxicillin], Tylenol [acetaminophen], Ampicillin, Elemental sulfur, Keflex [cephalexin], Macrobid [nitrofurantoin], Ofloxacin, and Penicillins   Social History:  The patient  reports that she has never smoked. She has never used smokeless tobacco. She reports that she does not currently use alcohol. She reports that she does not currently use drugs.   Family History:   family history includes Cancer in her father; Hip fracture in her maternal grandmother; Pneumonia in her maternal grandmother; Skin cancer in an other family member; Stroke in her mother and paternal grandfather.    Review of Systems: Review of Systems  Constitutional: Negative.   HENT: Negative.    Respiratory: Negative.    Cardiovascular: Negative.   Gastrointestinal: Negative.   Musculoskeletal: Negative.   Neurological: Negative.   Psychiatric/Behavioral: Negative.    All other systems reviewed and are negative.  PHYSICAL EXAM: VS:  BP 132/70 (BP Location: Left Arm, Patient Position: Sitting, Cuff Size: Normal)  Pulse 60   Ht 5' 5.5" (1.664 m)   Wt 102 lb (46.3 kg)   SpO2 97%   BMI 16.72 kg/m  , BMI Body mass index is 16.72 kg/m. GEN: Well nourished,in no acute distress, very thin HEENT: normal Neck: no JVD, carotid bruits, or masses Cardiac: RRR; no murmurs, rubs, or gallops,no edema  Respiratory:  clear to auscultation bilaterally,  normal work of breathing GI: soft, nontender, nondistended, + BS MS: no deformity or atrophy Skin: warm and dry, no rash Neuro:  Strength and sensation are intact Psych: euthymic mood, full affect  Recent Labs: 05/07/2021: ALT 21; BUN 29; Creatinine, Ser 0.75; Hemoglobin 14.3; Platelets 211; Potassium 4.1; Sodium 139    Lipid Panel Lab Results  Component Value Date   CHOL 188 10/16/2018   HDL 65.70 10/16/2018   LDLCALC 109 (H) 10/16/2018   TRIG 69.0 10/16/2018      Wt Readings from Last 3 Encounters:  11/08/21 102 lb (46.3 kg)  11/07/21 97 lb 9.6 oz (44.3 kg)  10/11/21 100 lb 6.4 oz (45.5 kg)     ASSESSMENT AND PLAN:  Problem List Items Addressed This Visit       Cardiology Problems   Aortic atherosclerosis ([place]) - Primary   Other Visit Diagnoses     Chest pain of uncertain etiology         Atypical chest pain No recent symptoms, no further work-up at this time  Anorexia/unintentional weight loss Discussed diet with her On remoron, up 6 pounds, stable  Aortic atherosclerosis Minimal disease noted on CT scan Not on statin No further work-up needed    Total encounter time more than 25 minutes  Greater than 50% was spent in counseling and coordination of care with the patient    Signed, Esmond Plants, M.D., Ph.D. [place], [place]

## 2021-11-08 ENCOUNTER — Encounter: Payer: Self-pay | Admitting: Cardiovascular Disease

## 2021-11-08 ENCOUNTER — Ambulatory Visit (INDEPENDENT_AMBULATORY_CARE_PROVIDER_SITE_OTHER): Payer: Medicare Other | Admitting: Cardiovascular Disease

## 2021-11-08 ENCOUNTER — Other Ambulatory Visit: Payer: Self-pay

## 2021-11-08 VITALS — BP 132/70 | HR 60 | Ht 65.5 in | Wt 102.0 lb

## 2021-11-08 DIAGNOSIS — I700 Atherosclerosis of aorta: Secondary | ICD-10-CM | POA: Diagnosis not present

## 2021-11-08 DIAGNOSIS — R079 Chest pain, unspecified: Secondary | ICD-10-CM

## 2021-11-08 NOTE — Patient Instructions (Addendum)
Medication Instructions:  No changes  If you need a refill on your cardiac medications before your next appointment, please call your pharmacy.   Lab work: No new labs needed  Testing/Procedures: No new testing needed  Follow-Up: At [facility], you and your health needs are our priority.  As part of our continuing mission to provide you with exceptional heart care, we have created designated Provider Care Teams.  These Care Teams include your primary Cardiologist (physician) and Advanced Practice Providers (APPs -  Physician Assistants and Nurse Practitioners) who all work together to provide you with the care you need, when you need it.  You will need a follow up appointment as needed  Providers on your designated Care Team:   Christopher Berge, NP Ryan Dunn, PA-C Cadence Furth, PA-C  COVID-19 Vaccine Information can be found at: [email or web address] For questions related to vaccine distribution or appointments, please email [email or web address] or call [phone].    

## 2022-01-05 ENCOUNTER — Telehealth: Payer: Self-pay | Admitting: Internal Medicine

## 2022-01-05 ENCOUNTER — Encounter: Payer: Self-pay | Admitting: Internal Medicine

## 2022-01-05 DIAGNOSIS — F419 Anxiety disorder, unspecified: Secondary | ICD-10-CM | POA: Insufficient documentation

## 2022-01-05 NOTE — Telephone Encounter (Signed)
Debbie called back, disregard last message. Debbie states that Dr Silvio Pate is already patient's provider at [facility].

## 2022-01-05 NOTE — Telephone Encounter (Signed)
Debbie from [facility] would like to know if Dr Olivia Mackie Received a FL2 from her. Her number is [phone]. She states that once patient is moved she will be a Dr Silvio Pate patient there.

## 2022-01-06 ENCOUNTER — Other Ambulatory Visit: Payer: Self-pay | Admitting: Internal Medicine

## 2022-01-06 NOTE — Telephone Encounter (Signed)
Noted, dr Olivia Mackie McLean-Scocuzza is aware

## 2022-01-12 DIAGNOSIS — E854 Organ-limited amyloidosis: Secondary | ICD-10-CM | POA: Diagnosis not present

## 2022-01-12 DIAGNOSIS — F39 Unspecified mood [affective] disorder: Secondary | ICD-10-CM | POA: Diagnosis not present

## 2022-01-12 DIAGNOSIS — K219 Gastro-esophageal reflux disease without esophagitis: Secondary | ICD-10-CM | POA: Diagnosis not present

## 2022-01-12 DIAGNOSIS — G301 Alzheimer's disease with late onset: Secondary | ICD-10-CM | POA: Diagnosis not present

## 2022-01-12 DIAGNOSIS — E441 Mild protein-calorie malnutrition: Secondary | ICD-10-CM | POA: Diagnosis not present

## 2022-01-12 DIAGNOSIS — I700 Atherosclerosis of aorta: Secondary | ICD-10-CM | POA: Diagnosis not present

## 2022-01-19 DIAGNOSIS — Z20822 Contact with and (suspected) exposure to covid-19: Secondary | ICD-10-CM | POA: Diagnosis not present

## 2022-01-20 ENCOUNTER — Other Ambulatory Visit: Payer: Self-pay | Admitting: Internal Medicine

## 2022-01-20 DIAGNOSIS — G301 Alzheimer's disease with late onset: Secondary | ICD-10-CM

## 2022-01-20 DIAGNOSIS — Z20822 Contact with and (suspected) exposure to covid-19: Secondary | ICD-10-CM | POA: Diagnosis not present

## 2022-01-20 DIAGNOSIS — F0280 Dementia in other diseases classified elsewhere without behavioral disturbance: Secondary | ICD-10-CM

## 2022-01-20 DIAGNOSIS — R634 Abnormal weight loss: Secondary | ICD-10-CM

## 2022-01-26 DIAGNOSIS — H2513 Age-related nuclear cataract, bilateral: Secondary | ICD-10-CM | POA: Diagnosis not present

## 2022-01-27 ENCOUNTER — Other Ambulatory Visit: Payer: Self-pay

## 2022-01-27 ENCOUNTER — Encounter: Payer: Self-pay | Admitting: Internal Medicine

## 2022-02-22 DIAGNOSIS — F39 Unspecified mood [affective] disorder: Secondary | ICD-10-CM | POA: Diagnosis not present

## 2022-02-22 DIAGNOSIS — E43 Unspecified severe protein-calorie malnutrition: Secondary | ICD-10-CM | POA: Diagnosis not present

## 2022-02-22 DIAGNOSIS — G309 Alzheimer's disease, unspecified: Secondary | ICD-10-CM | POA: Diagnosis not present

## 2022-02-22 DIAGNOSIS — I700 Atherosclerosis of aorta: Secondary | ICD-10-CM | POA: Diagnosis not present

## 2022-02-22 DIAGNOSIS — I680 Cerebral amyloid angiopathy: Secondary | ICD-10-CM | POA: Diagnosis not present

## 2022-02-22 DIAGNOSIS — K219 Gastro-esophageal reflux disease without esophagitis: Secondary | ICD-10-CM | POA: Diagnosis not present

## 2022-03-25 DIAGNOSIS — Z20822 Contact with and (suspected) exposure to covid-19: Secondary | ICD-10-CM | POA: Diagnosis not present

## 2022-04-05 ENCOUNTER — Ambulatory Visit: Payer: Medicare Other | Admitting: Internal Medicine

## 2022-04-13 DIAGNOSIS — K219 Gastro-esophageal reflux disease without esophagitis: Secondary | ICD-10-CM | POA: Diagnosis not present

## 2022-04-13 DIAGNOSIS — G301 Alzheimer's disease with late onset: Secondary | ICD-10-CM | POA: Diagnosis not present

## 2022-04-13 DIAGNOSIS — F39 Unspecified mood [affective] disorder: Secondary | ICD-10-CM | POA: Diagnosis not present

## 2022-04-13 DIAGNOSIS — E441 Mild protein-calorie malnutrition: Secondary | ICD-10-CM | POA: Diagnosis not present

## 2022-04-13 DIAGNOSIS — I680 Cerebral amyloid angiopathy: Secondary | ICD-10-CM | POA: Diagnosis not present

## 2022-04-17 DIAGNOSIS — G309 Alzheimer's disease, unspecified: Secondary | ICD-10-CM | POA: Diagnosis not present

## 2022-04-17 DIAGNOSIS — H5711 Ocular pain, right eye: Secondary | ICD-10-CM | POA: Diagnosis not present

## 2022-04-17 DIAGNOSIS — F0280 Dementia in other diseases classified elsewhere without behavioral disturbance: Secondary | ICD-10-CM | POA: Diagnosis not present

## 2022-04-17 DIAGNOSIS — F0150 Vascular dementia without behavioral disturbance: Secondary | ICD-10-CM | POA: Diagnosis not present

## 2022-05-09 DIAGNOSIS — Z23 Encounter for immunization: Secondary | ICD-10-CM | POA: Diagnosis not present

## 2022-06-14 DIAGNOSIS — I700 Atherosclerosis of aorta: Secondary | ICD-10-CM | POA: Diagnosis not present

## 2022-06-14 DIAGNOSIS — G309 Alzheimer's disease, unspecified: Secondary | ICD-10-CM | POA: Diagnosis not present

## 2022-06-14 DIAGNOSIS — K219 Gastro-esophageal reflux disease without esophagitis: Secondary | ICD-10-CM | POA: Diagnosis not present

## 2022-06-14 DIAGNOSIS — I680 Cerebral amyloid angiopathy: Secondary | ICD-10-CM | POA: Diagnosis not present

## 2022-06-14 DIAGNOSIS — E43 Unspecified severe protein-calorie malnutrition: Secondary | ICD-10-CM | POA: Diagnosis not present

## 2022-06-14 DIAGNOSIS — F39 Unspecified mood [affective] disorder: Secondary | ICD-10-CM | POA: Diagnosis not present

## 2022-08-17 DIAGNOSIS — F39 Unspecified mood [affective] disorder: Secondary | ICD-10-CM | POA: Diagnosis not present

## 2022-08-17 DIAGNOSIS — I680 Cerebral amyloid angiopathy: Secondary | ICD-10-CM | POA: Diagnosis not present

## 2022-08-17 DIAGNOSIS — R0789 Other chest pain: Secondary | ICD-10-CM | POA: Diagnosis not present

## 2022-08-17 DIAGNOSIS — G501 Atypical facial pain: Secondary | ICD-10-CM | POA: Diagnosis not present

## 2022-08-17 DIAGNOSIS — E441 Mild protein-calorie malnutrition: Secondary | ICD-10-CM | POA: Diagnosis not present

## 2022-09-06 DIAGNOSIS — A6920 Lyme disease, unspecified: Secondary | ICD-10-CM | POA: Diagnosis not present

## 2022-09-06 DIAGNOSIS — G301 Alzheimer's disease with late onset: Secondary | ICD-10-CM | POA: Diagnosis not present

## 2022-09-08 DIAGNOSIS — A6920 Lyme disease, unspecified: Secondary | ICD-10-CM | POA: Diagnosis not present

## 2022-09-08 DIAGNOSIS — G301 Alzheimer's disease with late onset: Secondary | ICD-10-CM | POA: Diagnosis not present

## 2022-09-13 DIAGNOSIS — A6920 Lyme disease, unspecified: Secondary | ICD-10-CM | POA: Diagnosis not present

## 2022-09-13 DIAGNOSIS — G301 Alzheimer's disease with late onset: Secondary | ICD-10-CM | POA: Diagnosis not present

## 2022-09-15 DIAGNOSIS — G301 Alzheimer's disease with late onset: Secondary | ICD-10-CM | POA: Diagnosis not present

## 2022-09-15 DIAGNOSIS — A6920 Lyme disease, unspecified: Secondary | ICD-10-CM | POA: Diagnosis not present

## 2022-09-20 DIAGNOSIS — G301 Alzheimer's disease with late onset: Secondary | ICD-10-CM | POA: Diagnosis not present

## 2022-09-20 DIAGNOSIS — A6920 Lyme disease, unspecified: Secondary | ICD-10-CM | POA: Diagnosis not present

## 2022-09-26 DIAGNOSIS — A6920 Lyme disease, unspecified: Secondary | ICD-10-CM | POA: Diagnosis not present

## 2022-09-26 DIAGNOSIS — G301 Alzheimer's disease with late onset: Secondary | ICD-10-CM | POA: Diagnosis not present

## 2022-09-27 DIAGNOSIS — A6920 Lyme disease, unspecified: Secondary | ICD-10-CM | POA: Diagnosis not present

## 2022-09-27 DIAGNOSIS — G301 Alzheimer's disease with late onset: Secondary | ICD-10-CM | POA: Diagnosis not present

## 2022-09-29 DIAGNOSIS — A6920 Lyme disease, unspecified: Secondary | ICD-10-CM | POA: Diagnosis not present

## 2022-09-29 DIAGNOSIS — G301 Alzheimer's disease with late onset: Secondary | ICD-10-CM | POA: Diagnosis not present

## 2022-10-04 DIAGNOSIS — A6920 Lyme disease, unspecified: Secondary | ICD-10-CM | POA: Diagnosis not present

## 2022-10-04 DIAGNOSIS — G301 Alzheimer's disease with late onset: Secondary | ICD-10-CM | POA: Diagnosis not present

## 2022-10-06 DIAGNOSIS — A6920 Lyme disease, unspecified: Secondary | ICD-10-CM | POA: Diagnosis not present

## 2022-10-06 DIAGNOSIS — G301 Alzheimer's disease with late onset: Secondary | ICD-10-CM | POA: Diagnosis not present

## 2022-10-11 ENCOUNTER — Non-Acute Institutional Stay (SKILLED_NURSING_FACILITY): Payer: Medicare Other | Admitting: Student

## 2022-10-11 ENCOUNTER — Encounter: Payer: Self-pay | Admitting: Student

## 2022-10-11 DIAGNOSIS — K219 Gastro-esophageal reflux disease without esophagitis: Secondary | ICD-10-CM

## 2022-10-11 DIAGNOSIS — F419 Anxiety disorder, unspecified: Secondary | ICD-10-CM

## 2022-10-11 DIAGNOSIS — F0150 Vascular dementia without behavioral disturbance: Secondary | ICD-10-CM | POA: Diagnosis not present

## 2022-10-11 DIAGNOSIS — G309 Alzheimer's disease, unspecified: Secondary | ICD-10-CM | POA: Diagnosis not present

## 2022-10-11 DIAGNOSIS — E441 Mild protein-calorie malnutrition: Secondary | ICD-10-CM

## 2022-10-11 DIAGNOSIS — I680 Cerebral amyloid angiopathy: Secondary | ICD-10-CM | POA: Diagnosis not present

## 2022-10-11 DIAGNOSIS — K5900 Constipation, unspecified: Secondary | ICD-10-CM | POA: Diagnosis not present

## 2022-10-11 DIAGNOSIS — F0280 Dementia in other diseases classified elsewhere without behavioral disturbance: Secondary | ICD-10-CM | POA: Diagnosis not present

## 2022-10-11 DIAGNOSIS — E854 Organ-limited amyloidosis: Secondary | ICD-10-CM | POA: Diagnosis not present

## 2022-10-11 DIAGNOSIS — I700 Atherosclerosis of aorta: Secondary | ICD-10-CM | POA: Diagnosis not present

## 2022-10-11 NOTE — Progress Notes (Signed)
Location:  Other [facility] Room Number: 108-P Place of Service:  ALF [phone]) Provider:  Dewayne Shorter, MD  Patient Care Team: Dewayne Shorter, MD as PCP - General (Family Medicine)  Extended Emergency Contact Information Primary Emergency Contact: [facility] Phone: [phone] Work Phone: [phone] Mobile Phone: [phone] Relation: Other Secondary Emergency Contact: riggins,joseph [place] Phone: [phone] Mobile Phone: [phone] Relation: Brother  Code Status:  Full Code  Goals of care: Advanced Directive information    10/11/2022    9:09 AM  Advanced Directives  Does Patient Have a Medical Advance Directive? No  Would patient like information on creating a medical advance directive? No - Patient declined     Chief Complaint  Patient presents with  . Medical Management of Chronic Issues    Routine visit. Discuss need for shingrix, additional covid boosters, td/tdap, and AWV or post pone if patient refuses or is not a candidate. NCIR verified. Vitals and medications are a reflection of [facility] EMR system, [employer] [facility]      HPI:  Pt is a 76 y.o. female seen today for medical management of chronic diseases.     Past Medical History:  Diagnosis Date  . Anxiety    knees, low back   . Arthritis   . History of chicken pox   . History of macular degeneration   . History of shingles    x3   . Osteoporosis   . Osteoporosis    declines medication   . 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 ABDOMINAL HYSTERECTOMY     DUB, endometriosis no h/o abnormal pap   . APPENDECTOMY    . COLONOSCOPY N/A 04/30/2015   Procedure: COLONOSCOPY;  Surgeon: Josefine Class, MD;  Location: [facility] ENDOSCOPY;  Service: Endoscopy;  Laterality: N/A;  . TONSILLECTOMY      Allergies  Allergen Reactions  . Amoxil [Amoxicillin]   . Tylenol  [Acetaminophen]     ?reaction    . Ampicillin Rash  . Elemental Sulfur Rash  . Keflex [Cephalexin] Rash  . Macrobid [Nitrofurantoin] Rash  . Ofloxacin Rash  . Penicillins Rash    Outpatient Encounter Medications as of 10/11/2022  Medication Sig  . aluminum-magnesium hydroxide-simethicone (MAALOX) [phone] MG/5ML SUSP Give 2 Tbsp by mouth every 4 hours as needed for gas, indigestion, or upset stomach Notify MD if no relief  . Artificial Tears ophthalmic solution Place 1 drop into both eyes 2 (two) times daily as needed (For Dry Eyes).  Marland Kitchen donepezil (ARICEPT) 10 MG tablet Take 10 mg by mouth at bedtime.  Marland Kitchen ibuprofen (ADVIL) 400 MG tablet Take 400 mg by mouth 2 (two) times daily as needed.  . memantine (NAMENDA) 10 MG tablet Take 10 mg by mouth 2 (two) times daily.  . mirtazapine (REMERON) 7.5 MG tablet Take 1 tablet (7.5 mg total) by mouth at bedtime.  . polyethylene glycol (MIRALAX / GLYCOLAX) 17 g packet Take 17 g by mouth daily. X three days and then as needed for constipation  . senna-docusate (SENOKOT-S) 8.6-50 MG tablet Take 1 tablet by mouth daily as needed for mild constipation.  . [DISCONTINUED] memantine (NAMENDA) 5 MG tablet Take 1 pill daily x 1 week then increase bid Per Dr. Manuella Ghazi neurology. Can you bubble pack?  . [DISCONTINUED] multivitamin-lutein (OCUVITE-LUTEIN) CAPS capsule Take 1 capsule by mouth every other day.  . [DISCONTINUED] omeprazole (  PRILOSEC) 20 MG capsule Take 1 capsule (20 mg total) by mouth daily.  . [DISCONTINUED] sennosides-docusate sodium (SENOKOT-S) 8.6-50 MG tablet Take 1 tablet by mouth every 3 (three) days.   No facility-administered encounter medications on file as of 10/11/2022.    Review of Systems  Immunization History  Administered Date(s) Administered  . Fluad Quad(high Dose 65+) 08/28/2019  . Hep A / Hep B 09/03/2019  . Influenza, High Dose Seasonal PF 09/09/2020, 08/28/2021, 09/27/2022  . Influenza-Unspecified 09/11/2015  . Moderna  Covid-19 Vaccine Bivalent Booster 40yr & up 05/09/2022  . Moderna Sars-Covid-2 Vaccination 12/23/2019, 01/20/2020, 10/26/2020  . PPension scheme manager134yr& up 09/02/2021  . Pneumococcal Conjugate-13 11/23/2020  . Pneumococcal Polysaccharide-23 09/11/2015  . Tdap 12/12/2011  . Zoster, Live 12/12/2003   Pertinent  Health Maintenance Due  Topic Date Due  . INFLUENZA VACCINE  Completed  . DEXA SCAN  Completed  . COLONOSCOPY (Pts [id number] coverage will need to be confirmed)  Discontinued      05/07/2021   11:15 AM 05/10/2021   11:28 AM 07/06/2021    3:17 PM 07/25/2021   11:35 AM 10/11/2021    3:26 PM  [place] the past year?  0 0 0 0  Was there an injury with Fall?  0 0  0  Fall Risk Category Calculator  0 0  0  Fall Risk Category  Low Low  Low  Patient Fall Risk Level Low fall risk Low fall risk Low fall risk Low fall risk Low fall risk  Patient at Risk for Falls Due to  Impaired balance/gait   No Fall Risks  Fall risk Follow up  Falls evaluation completed Falls evaluation completed Falls evaluation completed Falls evaluation completed   Functional Status Survey:    Vitals:   10/11/22 0852  BP: (!) 149/88  Pulse: 72  Resp: 20  Temp: 98.1 F (36.7 C)  SpO2: 97%  Weight: 102 lb 6.4 oz (46.4 kg)  Height: 5' 5.5" (1.664 m)   Body mass index is 16.78 kg/m. Physical Exam  Labs reviewed: No results for input(s): "NA", "K", "CL", "CO2", "GLUCOSE", "BUN", "CREATININE", "CALCIUM", "MG", "PHOS" in the last 8760 hours. No results for input(s): "AST", "ALT", "ALKPHOS", "BILITOT", "PROT", "ALBUMIN" in the last 8760 hours. No results for input(s): "WBC", "NEUTROABS", "HGB", "HCT", "MCV", "PLT" in the last 8760 hours. Lab Results  Component Value Date   TSH 1.79 09/17/2020   Lab Results  Component Value Date   HGBA1C 5.6 11/19/2018   Lab Results  Component Value Date   CHOL 188 10/16/2018   HDL 65.70 10/16/2018   LDLCALC 109 (H)  10/16/2018   TRIG 69.0 10/16/2018   CHOLHDL 3 10/16/2018    Significant Diagnostic Results in last 30 days:  No results found.  Assessment/Plan There are no diagnoses linked to this encounter.   Family/ staff Communication: ***  Labs/tests ordered:  ***

## 2022-10-17 ENCOUNTER — Encounter: Payer: Self-pay | Admitting: Student

## 2022-10-17 DIAGNOSIS — A6920 Lyme disease, unspecified: Secondary | ICD-10-CM | POA: Diagnosis not present

## 2022-10-17 DIAGNOSIS — G301 Alzheimer's disease with late onset: Secondary | ICD-10-CM | POA: Diagnosis not present

## 2022-10-19 DIAGNOSIS — A6920 Lyme disease, unspecified: Secondary | ICD-10-CM | POA: Diagnosis not present

## 2022-10-19 DIAGNOSIS — G301 Alzheimer's disease with late onset: Secondary | ICD-10-CM | POA: Diagnosis not present

## 2022-12-05 ENCOUNTER — Non-Acute Institutional Stay: Payer: Medicare Other | Admitting: Adult Health

## 2022-12-05 ENCOUNTER — Encounter: Payer: Self-pay | Admitting: Adult Health

## 2022-12-05 DIAGNOSIS — U071 COVID-19: Secondary | ICD-10-CM

## 2022-12-05 MED ORDER — BENZONATATE 100 MG PO CAPS: 100.0000 mg | ORAL_CAPSULE | Freq: Three times a day (TID) | ORAL | 0 refills | Status: AC

## 2022-12-05 MED ORDER — MOLNUPIRAVIR EUA 200MG CAPSULE: 4.0000 | ORAL_CAPSULE | Freq: Two times a day (BID) | ORAL | 0 refills | Status: AC

## 2022-12-05 NOTE — Progress Notes (Signed)
Location:  Other ([place] Room Number: [place] of Service:  ALF [phone]) Provider:  Durenda Age, DNP, FNP-BC  Patient Care Team: Dewayne Shorter, MD as PCP - General (Family Medicine)  Extended Emergency Contact Information Primary Emergency Contact: [facility] Phone: [phone] Work Phone: [phone] Mobile Phone: [phone] Relation: Other Secondary Emergency Contact: riggins,joseph [place] Phone: [phone] Mobile Phone: [phone] Relation: Brother  Code Status:   Full Code  Goals of care: Advanced Directive information    10/11/2022    9:09 AM  Advanced Directives  Does Patient Have a Medical Advance Directive? No  Would patient like information on creating a medical advance directive? No - Patient declined     Chief Complaint  Patient presents with   Acute Visit    Positive for COVID-19    HPI:  Pt is a 76 y.o. female seen today for an acute visit regarding positive COVID-19 test on 12/03/22. She coughs productively but not able to expectorate. She had temperature 101 on day 1, this is the 3rd day. She is a resident of [facility]. No noted labs. She eats 50% of her meals. O2 sat ranging from 90-93% on room air.   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 +   Past Surgical History:  Procedure Laterality Date   ABDOMINAL HYSTERECTOMY     DUB, endometriosis no h/o abnormal pap    APPENDECTOMY     COLONOSCOPY N/A 04/30/2015   Procedure: COLONOSCOPY;  Surgeon: Josefine Class, MD;  Location: [facility] ENDOSCOPY;  Service: Endoscopy;  Laterality: N/A;   TONSILLECTOMY      Allergies  Allergen Reactions   Amoxil  [Amoxicillin]    Tylenol [Acetaminophen]     ?reaction     Ampicillin Rash   Elemental Sulfur Rash   Keflex [Cephalexin] Rash   Macrobid [Nitrofurantoin] Rash   Ofloxacin Rash   Penicillins Rash    Outpatient Encounter Medications as of 12/05/2022  Medication Sig   benzonatate (TESSALON PERLES) 100 MG capsule Take 1 capsule (100 mg total) by mouth 3 (three) times daily for 7 days.   molnupiravir EUA (LAGEVRIO) 200 mg CAPS capsule Take 4 capsules (800 mg total) by mouth 2 (two) times daily for 5 days.   aluminum-magnesium hydroxide-simethicone (MAALOX) 505-397-67 MG/5ML SUSP Give 2 Tbsp by mouth every 4 hours as needed for gas, indigestion, or upset stomach Notify MD if no relief   Artificial Tears ophthalmic solution Place 1 drop into both eyes 2 (two) times daily as needed (For Dry Eyes).   donepezil (ARICEPT) 10 MG tablet Take 10 mg by mouth at bedtime.   memantine (NAMENDA) 10 MG tablet Take 10 mg by mouth 2 (two) times daily.   mirtazapine (REMERON) 7.5 MG tablet Take 1 tablet (7.5 mg total) by mouth at bedtime.   polyethylene glycol (MIRALAX / GLYCOLAX) 17 g packet Take 17 g by mouth daily. X three days and then as needed for constipation   senna-docusate (SENOKOT-S) 8.6-50 MG tablet Take 1 tablet by mouth daily as needed for mild constipation.  No facility-administered encounter medications on file as of 12/05/2022.    Review of Systems  Unable to obtain due to dementia.    Immunization History  Administered Date(s) Administered   Fluad Quad(high Dose 65+) 08/28/2019   Hep A / Hep B 09/03/2019   Influenza, High Dose Seasonal PF 09/09/2020, 08/28/2021, 09/27/2022   Influenza-Unspecified 09/11/2015   Moderna Covid-19 Vaccine Bivalent Booster 44yr & up 05/09/2022   Moderna Sars-Covid-2 Vaccination 12/23/2019, 01/20/2020, 10/26/2020   Pfizer Covid-19 Vaccine Bivalent Booster 180yr& up 09/02/2021   Pneumococcal Conjugate-13 11/23/2020   Pneumococcal Polysaccharide-23  09/11/2015   Tdap 12/12/2011   Zoster, Live 12/12/2003   Pertinent  Health Maintenance Due  Topic Date Due   INFLUENZA VACCINE  Completed   DEXA SCAN  Completed   COLONOSCOPY (Pts [id number] coverage will need to be confirmed)  Discontinued      05/07/2021   11:15 AM 05/10/2021   11:28 AM 07/06/2021    3:17 PM 07/25/2021   11:35 AM 10/11/2021    3:26 PM  Fall Risk  Falls in the past year?  0 0 0 0  Was there an injury with Fall?  0 0  0  Fall Risk Category Calculator  0 0  0  Fall Risk Category  Low Low  Low  Patient Fall Risk Level Low fall risk Low fall risk Low fall risk Low fall risk Low fall risk  Patient at Risk for Falls Due to  Impaired balance/gait   No Fall Risks  Fall risk Follow up  Falls evaluation completed Falls evaluation completed Falls evaluation completed Falls evaluation completed     Vitals:   12/05/22 1728  BP: 132/62  Pulse: 81  Resp: 16  Temp: 98.4 F (36.9 C)  SpO2: 93%  Weight: 100 lb 12.8 oz (45.7 kg)   Body mass index is 16.52 kg/m.  Physical Exam Constitutional:      General: She is not in acute distress. HENT:     Head: Normocephalic and atraumatic.     Nose: Nose normal.     Mouth/Throat:     Mouth: Mucous membranes are moist.  Eyes:     Conjunctiva/sclera: Conjunctivae normal.  Cardiovascular:     Rate and Rhythm: Normal rate and regular rhythm.  Pulmonary:     Effort: Pulmonary effort is normal.     Breath sounds: Rales present.  Abdominal:     General: Bowel sounds are normal.     Palpations: Abdomen is soft.  Musculoskeletal:        General: Normal range of motion.     Cervical back: Normal range of motion.  Skin:    General: Skin is warm and dry.  Neurological:     Mental Status: She is alert. Mental status is at baseline. She is disoriented.  Psychiatric:        Mood and Affect: Mood normal.        Behavior: Behavior normal.        Labs reviewed: No results for input(s): "NA", "K", "CL", "CO2",  "GLUCOSE", "BUN", "CREATININE", "CALCIUM", "MG", "PHOS" in the last 8760 hours. No results for input(s): "AST", "ALT", "ALKPHOS", "BILITOT", "PROT", "ALBUMIN" in the last 8760 hours. No results for input(s): "WBC", "NEUTROABS", "HGB", "HCT", "MCV", "PLT" in the last 8760 hours. Lab Results  Component Value Date   TSH 1.79 09/17/2020   Lab Results  Component Value Date   HGBA1C 5.6 11/19/2018   Lab Results  Component Value Date   CHOL  188 10/16/2018   HDL 65.70 10/16/2018   LDLCALC 109 (H) 10/16/2018   TRIG 69.0 10/16/2018   CHOLHDL 3 10/16/2018    Significant Diagnostic Results in last 30 days:  No results found.  Assessment/Plan  1. COVID-19 virus infection -  continue respiratory isolation - molnupiravir EUA (LAGEVRIO) 200 mg CAPS capsule; Take 4 capsules (800 mg total) by mouth 2 (two) times daily for 5 days.  Dispense: 40 capsule; Refill: 0 - benzonatate (TESSALON PERLES) 100 MG capsule; Take 1 capsule (100 mg total) by mouth 3 (three) times daily for 7 days.  Dispense: 21 capsule; Refill: 0    Family/ staff Communication: Discussed plan of care with resident and charge nurse.  Labs/tests ordered:  chest x-ray, CMP and CBC with differentials    Durenda Age, DNP, MSN, FNP-BC [place] [phone] (Monday-Friday 8:00 a.m. - 5:00 p.m.) [phone] (after hours)

## 2022-12-06 ENCOUNTER — Encounter: Payer: Self-pay | Admitting: Student

## 2022-12-06 ENCOUNTER — Non-Acute Institutional Stay: Payer: Medicare Other | Admitting: Student

## 2022-12-06 DIAGNOSIS — R059 Cough, unspecified: Secondary | ICD-10-CM | POA: Diagnosis not present

## 2022-12-06 DIAGNOSIS — U071 COVID-19: Secondary | ICD-10-CM

## 2022-12-06 NOTE — Progress Notes (Signed)
Location:  Other [place] Room Number: [facility] [address]P Place of Service:  ALF [phone]) Provider:  Dr. Amada Kingfisher, MD  Patient Care Team: Evelyn Shorter, MD as PCP - General (Family Medicine)  Extended Emergency Contact Information Primary Emergency Contact: [facility] Phone: [phone] Work Phone: [phone] Mobile Phone: [phone] Relation: Other Secondary Emergency Contact: riggins,joseph [place] Phone: [phone] Mobile Phone: [phone] Relation: Brother  Code Status:  DNR Goals of care: Advanced Directive information    12/06/2022    9:26 AM  Advanced Directives  Does Patient Have a Medical Advance Directive? Yes  Type of Paramedic of [place];Out of facility DNR (pink MOST or yellow form)  Does patient want to make changes to medical advance directive? No - Patient declined  Copy of [place] in Chart? Yes - validated most recent copy scanned in chart (See row information)     Chief Complaint  Patient presents with   Acute Visit    Covid Infection.     HPI:  Pt is a 76 y.o. female seen today for an acute visit for COVID infection. Patient has had cough, however, in the last day she has improved. She has been taking paxlovid for the last day. Nursing state she has been well. She eats a self-restricted diet, but is eating her normal amount.   Patient is sleeping deeply.    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 +   Past Surgical History:  Procedure Laterality Date   ABDOMINAL HYSTERECTOMY     DUB, endometriosis no h/o abnormal pap    APPENDECTOMY     COLONOSCOPY N/A 04/30/2015    Procedure: COLONOSCOPY;  Surgeon: Josefine Class, MD;  Location: [facility] ENDOSCOPY;  Service: Endoscopy;  Laterality: N/A;   TONSILLECTOMY      Allergies  Allergen Reactions   Amoxil [Amoxicillin]    Sulfa Antibiotics    Tylenol [Acetaminophen]     ?reaction     Ampicillin Rash   Elemental Sulfur Rash   Keflex [Cephalexin] Rash   Macrobid [Nitrofurantoin] Rash   Ofloxacin Rash   Penicillins Rash    Outpatient Encounter Medications as of 12/06/2022  Medication Sig   aluminum-magnesium hydroxide-simethicone (MAALOX) [phone] MG/5ML SUSP Give 2 Tbsp by mouth every 4 hours as needed for gas, indigestion, or upset stomach Notify MD if no relief   Artificial Tears ophthalmic solution Place 1 drop into both eyes 2 (two) times daily as needed (For Dry Eyes).   benzonatate (TESSALON PERLES) 100 MG capsule Take 1 capsule (100 mg total) by mouth 3 (three) times daily for 7 days.   donepezil (ARICEPT) 10 MG tablet Take 10 mg by mouth at bedtime.   ibuprofen (ADVIL) 400 MG tablet Take 400 mg by mouth 2 (two) times daily as needed.   loratadine (CLARITIN) 10 MG tablet Take 10 mg by mouth daily.   memantine (NAMENDA) 10 MG tablet Take 10 mg by mouth 2 (two) times daily.   mirtazapine (REMERON) 7.5 MG tablet Take 1 tablet (7.5 mg total) by mouth at bedtime.  molnupiravir EUA (LAGEVRIO) 200 mg CAPS capsule Take 4 capsules (800 mg total) by mouth 2 (two) times daily for 5 days.   polyethylene glycol powder (GLYCOLAX/MIRALAX) 17 GM/SCOOP powder Take 1 Container by mouth every other day.   senna-docusate (SENOKOT-S) 8.6-50 MG tablet Take 1 tablet by mouth daily as needed for mild constipation.   [DISCONTINUED] polyethylene glycol (MIRALAX / GLYCOLAX) 17 g packet Take 17 g by mouth daily. X three days and then as needed for constipation   No facility-administered encounter medications on file as of 12/06/2022.    Review of Systems  All other systems reviewed and are  negative.   Immunization History  Administered Date(s) Administered   Fluad Quad(high Dose 65+) 08/28/2019   Hep A / Hep B 09/03/2019   Influenza, High Dose Seasonal PF 09/09/2020, 08/28/2021, 09/27/2022   Influenza-Unspecified 09/11/2015   Moderna Covid-19 Vaccine Bivalent Booster 66yr & up 05/09/2022   Moderna Sars-Covid-2 Vaccination 12/23/2019, 01/20/2020, 10/26/2020   Pfizer Covid-19 Vaccine Bivalent Booster 185yr& up 09/02/2021   Pneumococcal Conjugate-13 11/23/2020   Pneumococcal Polysaccharide-23 09/11/2015   Tdap 12/12/2011   Zoster, Live 12/12/2003   Pertinent  Health Maintenance Due  Topic Date Due   INFLUENZA VACCINE  Completed   DEXA SCAN  Completed   COLONOSCOPY (Pts [id number] coverage will need to be confirmed)  Discontinued      05/07/2021   11:15 AM 05/10/2021   11:28 AM 07/06/2021    3:17 PM 07/25/2021   11:35 AM 10/11/2021    3:26 PM  Fall Risk  Falls in the past year?  0 0 0 0  Was there an injury with Fall?  0 0  0  Fall Risk Category Calculator  0 0  0  Fall Risk Category  Low Low  Low  Patient Fall Risk Level Low fall risk Low fall risk Low fall risk Low fall risk Low fall risk  Patient at Risk for Falls Due to  Impaired balance/gait   No Fall Risks  Fall risk Follow up  Falls evaluation completed Falls evaluation completed Falls evaluation completed Falls evaluation completed   Functional Status Survey:    Vitals:   12/06/22 0915  BP: (!) 142/78  Pulse: 81  Resp: 16  Temp: 98 F (36.7 C)  SpO2: 93%  Weight: 100 lb 12.8 oz (45.7 kg)  Height: 5' 5.5" (1.664 m)   Body mass index is 16.52 kg/m. Physical Exam Vitals reviewed.  Constitutional:      Comments: Sleeping comfortably in bed  Cardiovascular:     Rate and Rhythm: Normal rate and regular rhythm.  Pulmonary:     Effort: Pulmonary effort is normal.     Breath sounds: Normal breath sounds.  Abdominal:     General: Bowel sounds are normal.     Palpations: Abdomen is soft.   Skin:    General: Skin is warm.     Capillary Refill: Capillary refill takes less than 2 seconds.  Neurological:     Mental Status: She is alert.     Labs reviewed: No results for input(s): "NA", "K", "CL", "CO2", "GLUCOSE", "BUN", "CREATININE", "CALCIUM", "MG", "PHOS" in the last 8760 hours. No results for input(s): "AST", "ALT", "ALKPHOS", "BILITOT", "PROT", "ALBUMIN" in the last 8760 hours. No results for input(s): "WBC", "NEUTROABS", "HGB", "HCT", "MCV", "PLT" in the last 8760 hours. Lab Results  Component Value Date   TSH 1.79 09/17/2020   Lab Results  Component Value Date   HGBA1C 5.6 11/19/2018  Lab Results  Component Value Date   CHOL 188 10/16/2018   HDL 65.70 10/16/2018   LDLCALC 109 (H) 10/16/2018   TRIG 69.0 10/16/2018   CHOLHDL 3 10/16/2018    Significant Diagnostic Results in last 30 days:  No results found.  Assessment/Plan 1. COVID-19 virus infection Patient is stable and tolerating treatment well. CXR negative. Continue Paxlovid and continue to monitor.    Family/ staff Communication: nursing  Labs/tests ordered:  none

## 2022-12-07 DIAGNOSIS — Z8616 Personal history of COVID-19: Secondary | ICD-10-CM | POA: Diagnosis not present

## 2022-12-07 DIAGNOSIS — I10 Essential (primary) hypertension: Secondary | ICD-10-CM | POA: Diagnosis not present

## 2022-12-07 LAB — HEPATIC FUNCTION PANEL
ALT: 19 U/L (ref 7–35)
AST: 33 (ref 13–35)
Alkaline Phosphatase: 59 (ref 25–125)
Bilirubin, Total: 0.9

## 2022-12-07 LAB — COMPREHENSIVE METABOLIC PANEL
Albumin: 3.4 — AB (ref 3.5–5.0)
Calcium: 8.7 (ref 8.7–10.7)
Globulin: 2
eGFR: 55

## 2022-12-07 LAB — CBC AND DIFFERENTIAL
HCT: 38 (ref 36–46)
Hemoglobin: 13.1 (ref 12.0–16.0)
Neutrophils Absolute: 2793
Platelets: 200 10*3/uL (ref 150–400)
WBC: 4.2

## 2022-12-07 LAB — BASIC METABOLIC PANEL
BUN: 33 — AB (ref 4–21)
CO2: 28 — AB (ref 13–22)
Chloride: 103 (ref 99–108)
Creatinine: 1.1 (ref 0.5–1.1)
Glucose: 84
Potassium: 3.6 mEq/L (ref 3.5–5.1)
Sodium: 139 (ref 137–147)

## 2022-12-07 LAB — CBC: RBC: 4.08 (ref 3.87–5.11)

## 2022-12-15 ENCOUNTER — Non-Acute Institutional Stay: Payer: Medicare Other | Admitting: Nurse Practitioner

## 2022-12-15 ENCOUNTER — Encounter: Payer: Self-pay | Admitting: Family

## 2022-12-15 ENCOUNTER — Encounter: Payer: Self-pay | Admitting: Nurse Practitioner

## 2022-12-15 DIAGNOSIS — K5900 Constipation, unspecified: Secondary | ICD-10-CM | POA: Diagnosis not present

## 2022-12-15 DIAGNOSIS — I700 Atherosclerosis of aorta: Secondary | ICD-10-CM

## 2022-12-15 DIAGNOSIS — F0150 Vascular dementia without behavioral disturbance: Secondary | ICD-10-CM | POA: Diagnosis not present

## 2022-12-15 DIAGNOSIS — U071 COVID-19: Secondary | ICD-10-CM

## 2022-12-15 DIAGNOSIS — G309 Alzheimer's disease, unspecified: Secondary | ICD-10-CM | POA: Diagnosis not present

## 2022-12-15 DIAGNOSIS — E441 Mild protein-calorie malnutrition: Secondary | ICD-10-CM

## 2022-12-15 DIAGNOSIS — F0280 Dementia in other diseases classified elsewhere without behavioral disturbance: Secondary | ICD-10-CM | POA: Diagnosis not present

## 2022-12-15 NOTE — Progress Notes (Signed)
Location:  Other [facility]) Nursing Home Room Number: [place] of Service:  ALF (13)  Breyden Jeudy K. Dewaine Oats, NP   Patient Care Team: Dewayne Shorter, MD as PCP - General (Family Medicine)  Extended Emergency Contact Information Primary Emergency Contact: [facility] Phone: [phone] Work Phone: [phone] Mobile Phone: [phone] Relation: Other Secondary Emergency Contact: riggins,joseph [place] Phone: [phone] Mobile Phone: [phone] Relation: Brother  Goals of care: Advanced Directive information    12/15/2022   10:32 AM  Advanced Directives  Does Patient Have a Medical Advance Directive? Yes  Type of Paramedic of [place];Out of facility DNR (pink MOST or yellow form)  Does patient want to make changes to medical advance directive? No - Patient declined  Copy of [place] in Chart? Yes - validated most recent copy scanned in chart (See row information)  Pre-existing out of facility DNR order (yellow form or pink MOST form) Yellow form placed in chart (order not valid for inpatient use)     Chief Complaint  Patient presents with   Medical Management of Chronic Issues    Routine visit. Discuss need for shingrix, td/tdap, AWV,and coivd boosters or post pone if patient refuses or is not a candidate. Vitals and medications are a reflection of [facility] EMR system, [facility]      HPI:  Pt is a 77 y.o. female seen today for medical management of chronic disease.  Pt with hx of dementia, anxiety, constipation  She has been on isolation due to positive COVID. She no longer has cough or congestion. She is still testing positive but no ongoing symptoms.  She has been steadily losing weight, she is now on liquid supplement and appetite stimulate.  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 +   Past Surgical History:  Procedure Laterality Date   ABDOMINAL HYSTERECTOMY     DUB, endometriosis no h/o abnormal pap    APPENDECTOMY     COLONOSCOPY N/A 04/30/2015   Procedure: COLONOSCOPY;  Surgeon: Josefine Class, MD;  Location: [facility] ENDOSCOPY;  Service: Endoscopy;  Laterality: N/A;   TONSILLECTOMY      Allergies  Allergen Reactions   Amoxil [Amoxicillin]    Sulfa Antibiotics    Tylenol [Acetaminophen]     ?reaction     Ampicillin Rash   Elemental Sulfur Rash   Keflex [Cephalexin] Rash   Macrobid [Nitrofurantoin] Rash   Ofloxacin Rash   Penicillins Rash    Outpatient Encounter Medications as of 12/15/2022  Medication Sig   aluminum-magnesium hydroxide-simethicone (MAALOX) [phone] MG/5ML SUSP Give 2 Tbsp by mouth every 4 hours as needed for gas, indigestion, or upset stomach Notify MD if no relief   Artificial Tears ophthalmic solution Place 1 drop into both eyes 2 (two) times daily as needed (For Dry Eyes).   donepezil (ARICEPT) 10 MG tablet Take 10 mg by mouth at bedtime.   ibuprofen (ADVIL) 400 MG tablet Take 400 mg by mouth 2 (two) times daily as needed.   loratadine (CLARITIN)  10 MG tablet Take 10 mg by mouth daily.   memantine (NAMENDA) 10 MG tablet Take 10 mg by mouth 2 (two) times daily.   mirtazapine (REMERON) 7.5 MG tablet Take 1 tablet (7.5 mg total) by mouth at bedtime.   polyethylene glycol powder (GLYCOLAX/MIRALAX) 17 GM/SCOOP powder Take 1 Container by mouth every other day.   senna-docusate (SENOKOT-S) 8.6-50 MG tablet Take 1 tablet by mouth daily as needed for mild constipation.   No facility-administered encounter medications on file as of 12/15/2022.    Review of Systems  Unable to perform ROS: Dementia      Immunization History  Administered Date(s) Administered   Fluad Quad(high Dose 65+) 08/28/2019   Hep A / Hep B 09/03/2019   Influenza, High Dose Seasonal PF 09/09/2020, 08/28/2021, 09/27/2022   Influenza-Unspecified 09/11/2015   Moderna Covid-19 Vaccine Bivalent Booster 80yr & up 05/09/2022   Moderna Sars-Covid-2 Vaccination 12/23/2019, 01/20/2020, 10/26/2020   Pfizer Covid-19 Vaccine Bivalent Booster 124yr& up 09/02/2021   Pneumococcal Conjugate-13 11/23/2020   Pneumococcal Polysaccharide-23 09/11/2015   Tdap 12/12/2011   Zoster, Live 12/12/2003   Pertinent  Health Maintenance Due  Topic Date Due   INFLUENZA VACCINE  Completed   DEXA SCAN  Completed   COLONOSCOPY (Pts [id number] coverage will need to be confirmed)  Discontinued      05/07/2021   11:15 AM 05/10/2021   11:28 AM 07/06/2021    3:17 PM 07/25/2021   11:35 AM 10/11/2021    3:26 PM  Fall Risk  Falls in the past year?  0 0 0 0  Was there an injury with Fall?  0 0  0  Fall Risk Category Calculator  0 0  0  Fall Risk Category  Low Low  Low  Patient Fall Risk Level Low fall risk Low fall risk Low fall risk Low fall risk Low fall risk  Patient at Risk for Falls Due to  Impaired balance/gait   No Fall Risks  Fall risk Follow up  Falls evaluation completed Falls evaluation completed Falls evaluation completed Falls evaluation completed   Functional Status Survey:    Vitals:   12/15/22 1033  BP: 128/64  Pulse: 77  Temp: 97.7 F (36.5 C)  Weight: 100 lb 12.8 oz (45.7 kg)  Height: 5' 5.5" (1.664 m)   Body mass index is 16.52 kg/m. Physical Exam Constitutional:      General: She is not in acute distress.    Appearance: She is well-developed. She is not diaphoretic.  HENT:     Head: Normocephalic and atraumatic.     Nose: No congestion or rhinorrhea.     Mouth/Throat:     Mouth: Mucous membranes are moist.     Pharynx: Oropharynx is clear. No oropharyngeal exudate.  Eyes:     Conjunctiva/sclera:  Conjunctivae normal.     Pupils: Pupils are equal, round, and reactive to light.  Cardiovascular:     Rate and Rhythm: Normal rate and regular rhythm.     Heart sounds: Normal heart sounds.  Pulmonary:     Effort: Pulmonary effort is normal.     Breath sounds: Normal breath sounds.  Abdominal:     General: Bowel sounds are normal.     Palpations: Abdomen is soft.  Musculoskeletal:     Cervical back: Normal range of motion and neck supple.     Right lower leg: No edema.     Left lower leg: No edema.  Skin:    General: Skin is  warm and dry.  Neurological:     Mental Status: She is alert. Mental status is at baseline.     Gait: Gait abnormal (slow).  Psychiatric:        Mood and Affect: Mood normal.     Labs reviewed: No results for input(s): "NA", "K", "CL", "CO2", "GLUCOSE", "BUN", "CREATININE", "CALCIUM", "MG", "PHOS" in the last 8760 hours. No results for input(s): "AST", "ALT", "ALKPHOS", "BILITOT", "PROT", "ALBUMIN" in the last 8760 hours. No results for input(s): "WBC", "NEUTROABS", "HGB", "HCT", "MCV", "PLT" in the last 8760 hours. Lab Results  Component Value Date   TSH 1.79 09/17/2020   Lab Results  Component Value Date   HGBA1C 5.6 11/19/2018   Lab Results  Component Value Date   CHOL 188 10/16/2018   HDL 65.70 10/16/2018   LDLCALC 109 (H) 10/16/2018   TRIG 69.0 10/16/2018   CHOLHDL 3 10/16/2018    Significant Diagnostic Results in last 30 days:  No results found.  Assessment/Plan 1. Constipation, unspecified constipation type -well controlled with added miralax  2. Malnutrition of mild degree (HCC) No recent weight but nursing reports decrease in intake, nutritional supplements have bee added to appetite stimulate, continues on Remeron   3. Mixed Alzheimer's and vascular dementia ([place]) -continues on namenda and aricept   4. Aortic atherosclerosis ([place]) Noted.   5. COVID-19 virus infection -currently asymptomatic, she will come off isolation  tomorrow.    Carlos American. [place], [facility] [phone]

## 2022-12-15 NOTE — Progress Notes (Signed)
Opened in error

## 2022-12-15 NOTE — Progress Notes (Deleted)
Location:  Other [facility]) Nursing Home Room Number: [place] of Service:  ALF (13)  Jessica K. Dewaine Oats, NP   Patient Care Team: Dewayne Shorter, MD as PCP - General (Family Medicine)  Extended Emergency Contact Information Primary Emergency Contact: [facility] Phone: [phone] Work Phone: [phone] Mobile Phone: [phone] Relation: Other Secondary Emergency Contact: riggins,joseph [place] Phone: [phone] Mobile Phone: [phone] Relation: Brother  Goals of care: Advanced Directive information    12/15/2022    9:20 AM  Advanced Directives  Does Patient Have a Medical Advance Directive? Yes  Type of Paramedic of [place];Out of facility DNR (pink MOST or yellow form)  Does patient want to make changes to medical advance directive? No - Patient declined  Copy of [place] in Chart? Yes - validated most recent copy scanned in chart (See row information)  Pre-existing out of facility DNR order (yellow form or pink MOST form) Yellow form placed in chart (order not valid for inpatient use)     Chief Complaint  Patient presents with  . Medical Management of Chronic Issues    Routine visit. Discuss need for shingrix, td/tdap, AWV, and additional covid boosters or post pone if patient refuses or is not a candidate. Vitals and medications are a reflection of [facility]MR system, [employer]    . Error    HPI:  Pt is a 77 y.o. female seen today for medical management of chronic disease.    Past Medical History:  Diagnosis Date  . Anxiety    knees, low back   . Arthritis   . History of chicken pox   . History of macular degeneration   . History of shingles    x3   . Osteoporosis   . Osteoporosis    declines medication   . 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 ABDOMINAL HYSTERECTOMY      DUB, endometriosis no h/o abnormal pap   . APPENDECTOMY    . COLONOSCOPY N/A 04/30/2015   Procedure: COLONOSCOPY;  Surgeon: Josefine Class, MD;  Location: [facility] ENDOSCOPY;  Service: Endoscopy;  Laterality: N/A;  . TONSILLECTOMY      Allergies  Allergen Reactions  . Amoxil [Amoxicillin]   . Sulfa Antibiotics   . Tylenol [Acetaminophen]     ?reaction    . Ampicillin Rash  . Elemental Sulfur Rash  . Keflex [Cephalexin] Rash  . Macrobid [Nitrofurantoin] Rash  . Ofloxacin Rash  . Penicillins Rash    Outpatient Encounter Medications as of 12/15/2022  Medication Sig  . aluminum-magnesium hydroxide-simethicone (MAALOX) 585-277-82 MG/5ML SUSP Give 2 Tbsp by mouth every 4 hours as needed for gas, indigestion, or upset stomach Notify MD if no relief  . Artificial Tears ophthalmic solution Place 1 drop into both eyes 2 (two) times daily as needed (For Dry Eyes).  Marland Kitchen donepezil (ARICEPT) 10 MG tablet Take 10 mg by mouth at bedtime.  Marland Kitchen ibuprofen (ADVIL) 400 MG tablet Take 400 mg by mouth 2 (two) times daily as needed.  . loratadine (CLARITIN) 10 MG tablet Take 10 mg by mouth daily.  . memantine (NAMENDA) 10 MG tablet Take 10 mg by mouth 2 (two) times daily.  . mirtazapine (REMERON) 7.5 MG tablet Take 1 tablet (7.5 mg total) by mouth at bedtime.  . polyethylene glycol powder (GLYCOLAX/MIRALAX) 17 GM/SCOOP powder Take 1 Container  by mouth every other day.  . senna-docusate (SENOKOT-S) 8.6-50 MG tablet Take 1 tablet by mouth daily as needed for mild constipation.   No facility-administered encounter medications on file as of 12/15/2022.    Review of Systems ***  Immunization History  Administered Date(s) Administered  . Fluad Quad(high Dose 65+) 08/28/2019  . Hep A / Hep B 09/03/2019  . Influenza, High Dose Seasonal PF 09/09/2020, 08/28/2021, 09/27/2022  . Influenza-Unspecified 09/11/2015  . Moderna Covid-19 Vaccine Bivalent Booster 7yr & up 05/09/2022  . Moderna Sars-Covid-2 Vaccination  12/23/2019, 01/20/2020, 10/26/2020  . PPension scheme manager166yr& up 09/02/2021  . Pneumococcal Conjugate-13 11/23/2020  . Pneumococcal Polysaccharide-23 09/11/2015  . Tdap 12/12/2011  . Zoster, Live 12/12/2003   Pertinent  Health Maintenance Due  Topic Date Due  . INFLUENZA VACCINE  Completed  . DEXA SCAN  Completed  . COLONOSCOPY (Pts 45-4928yrnsurance coverage will need to be confirmed)  Discontinued      05/07/2021   11:15 AM 05/10/2021   11:28 AM 07/06/2021    3:17 PM 07/25/2021   11:35 AM 10/11/2021    3:26 PM  [place] the past year?  0 0 0 0  Was there an injury with Fall?  0 0  0  Fall Risk Category Calculator  0 0  0  Fall Risk Category  Low Low  Low  Patient Fall Risk Level Low fall risk Low fall risk Low fall risk Low fall risk Low fall risk  Patient at Risk for Falls Due to  Impaired balance/gait   No Fall Risks  Fall risk Follow up  Falls evaluation completed Falls evaluation completed Falls evaluation completed Falls evaluation completed   Functional Status Survey:    Vitals:   12/15/22 0916  BP: 128/64  Pulse: 77  Resp: 18  Temp: 97.7 F (36.5 C)  Weight: 100 lb 12.8 oz (45.7 kg)  Height: 5' 5.5" (1.664 m)   Body mass index is 16.52 kg/m. Physical Exam***  Labs reviewed: No results for input(s): "NA", "K", "CL", "CO2", "GLUCOSE", "BUN", "CREATININE", "CALCIUM", "MG", "PHOS" in the last 8760 hours. No results for input(s): "AST", "ALT", "ALKPHOS", "BILITOT", "PROT", "ALBUMIN" in the last 8760 hours. No results for input(s): "WBC", "NEUTROABS", "HGB", "HCT", "MCV", "PLT" in the last 8760 hours. Lab Results  Component Value Date   TSH 1.79 09/17/2020   Lab Results  Component Value Date   HGBA1C 5.6 11/19/2018   Lab Results  Component Value Date   CHOL 188 10/16/2018   HDL 65.70 10/16/2018   LDLCALC 109 (H) 10/16/2018   TRIG 69.0 10/16/2018   CHOLHDL 3 10/16/2018    Significant Diagnostic Results in last 30  days:  No results found.  Assessment/Plan There are no diagnoses linked to this encounter.   JesCarlos Americanubanks, AGNSunnysideult Medicine [phone] encounter was created in error - please disregard.

## 2022-12-28 DIAGNOSIS — R4189 Other symptoms and signs involving cognitive functions and awareness: Secondary | ICD-10-CM | POA: Diagnosis not present

## 2022-12-28 DIAGNOSIS — Z9181 History of falling: Secondary | ICD-10-CM | POA: Diagnosis not present

## 2022-12-28 DIAGNOSIS — M6281 Muscle weakness (generalized): Secondary | ICD-10-CM | POA: Diagnosis not present

## 2022-12-28 DIAGNOSIS — A6920 Lyme disease, unspecified: Secondary | ICD-10-CM | POA: Diagnosis not present

## 2022-12-28 DIAGNOSIS — Z741 Need for assistance with personal care: Secondary | ICD-10-CM | POA: Diagnosis not present

## 2022-12-28 DIAGNOSIS — R2689 Other abnormalities of gait and mobility: Secondary | ICD-10-CM | POA: Diagnosis not present

## 2022-12-28 DIAGNOSIS — G301 Alzheimer's disease with late onset: Secondary | ICD-10-CM | POA: Diagnosis not present

## 2022-12-28 DIAGNOSIS — R278 Other lack of coordination: Secondary | ICD-10-CM | POA: Diagnosis not present

## 2023-01-01 DIAGNOSIS — R2689 Other abnormalities of gait and mobility: Secondary | ICD-10-CM | POA: Diagnosis not present

## 2023-01-01 DIAGNOSIS — G301 Alzheimer's disease with late onset: Secondary | ICD-10-CM | POA: Diagnosis not present

## 2023-01-01 DIAGNOSIS — Z9181 History of falling: Secondary | ICD-10-CM | POA: Diagnosis not present

## 2023-01-01 DIAGNOSIS — R278 Other lack of coordination: Secondary | ICD-10-CM | POA: Diagnosis not present

## 2023-01-01 DIAGNOSIS — R4189 Other symptoms and signs involving cognitive functions and awareness: Secondary | ICD-10-CM | POA: Diagnosis not present

## 2023-01-01 DIAGNOSIS — A6920 Lyme disease, unspecified: Secondary | ICD-10-CM | POA: Diagnosis not present

## 2023-01-03 DIAGNOSIS — R2689 Other abnormalities of gait and mobility: Secondary | ICD-10-CM | POA: Diagnosis not present

## 2023-01-03 DIAGNOSIS — A6920 Lyme disease, unspecified: Secondary | ICD-10-CM | POA: Diagnosis not present

## 2023-01-03 DIAGNOSIS — R278 Other lack of coordination: Secondary | ICD-10-CM | POA: Diagnosis not present

## 2023-01-03 DIAGNOSIS — Z9181 History of falling: Secondary | ICD-10-CM | POA: Diagnosis not present

## 2023-01-03 DIAGNOSIS — R4189 Other symptoms and signs involving cognitive functions and awareness: Secondary | ICD-10-CM | POA: Diagnosis not present

## 2023-01-03 DIAGNOSIS — G301 Alzheimer's disease with late onset: Secondary | ICD-10-CM | POA: Diagnosis not present

## 2023-01-04 ENCOUNTER — Encounter: Payer: Self-pay | Admitting: Adult Health

## 2023-01-04 ENCOUNTER — Non-Acute Institutional Stay: Payer: Medicare Other | Admitting: Adult Health

## 2023-01-04 DIAGNOSIS — F0280 Dementia in other diseases classified elsewhere without behavioral disturbance: Secondary | ICD-10-CM

## 2023-01-04 DIAGNOSIS — F0150 Vascular dementia without behavioral disturbance: Secondary | ICD-10-CM

## 2023-01-04 DIAGNOSIS — G309 Alzheimer's disease, unspecified: Secondary | ICD-10-CM | POA: Diagnosis not present

## 2023-01-04 DIAGNOSIS — N390 Urinary tract infection, site not specified: Secondary | ICD-10-CM | POA: Diagnosis not present

## 2023-01-04 DIAGNOSIS — R451 Restlessness and agitation: Secondary | ICD-10-CM | POA: Diagnosis not present

## 2023-01-04 NOTE — Progress Notes (Signed)
Location:   [place] Room Number: 892J Place of Service:  ALF [phone]) Provider:  Durenda Age, NP  Dewayne Shorter, MD  Patient Care Team: Dewayne Shorter, MD as PCP - General (Family Medicine)  Extended Emergency Contact Information Primary Emergency Contact: [facility] Phone: [phone] Work Phone: [phone] Mobile Phone: [phone] Relation: Other Secondary Emergency Contact: riggins,joseph [place] Phone: [phone] Mobile Phone: [phone] Relation: Brother  Code Status:  DNR Goals of care: Advanced Directive information    01/04/2023    2:19 PM  Advanced Directives  Does Patient Have a Medical Advance Directive? Yes  Type of Paramedic of [place];Out of facility DNR (pink MOST or yellow form)  Does patient want to make changes to medical advance directive? No - Patient declined  Copy of [place] in Chart? Yes - validated most recent copy scanned in chart (See row information)  Pre-existing out of facility DNR order (yellow form or pink MOST form) Yellow form placed in chart (order not valid for inpatient use)     Chief Complaint  Patient presents with   Acute Visit    Confusion, delusional and agitated    HPI:  Pt is a 77 y.o. female seen today for an acute visit for confusion, delusion and agitated. She is a resident of [facility]. She was seen in her room today. She was earlier seen walking around the tv area  and the nurses station. She stated that she was told that she uses too much plastic bags. Charge nurse stated that she has put water in the plastic bag from the trash. She thinks that staff is poisoning her. She gets aggressive when staff is trying to help her. She had a recent increased in Mirtazapine dosage from 7.5 mg to 15 mg at bedtime.   A collection hat for her urinalysis was put on the toilet bowl but  she was reported to have urinated on the bathroom floor instead.  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 +   Past Surgical History:  Procedure Laterality Date   ABDOMINAL HYSTERECTOMY     DUB, endometriosis no h/o abnormal pap    APPENDECTOMY     COLONOSCOPY N/A 04/30/2015   Procedure: COLONOSCOPY;  Surgeon: Josefine Class, MD;  Location: [facility] ENDOSCOPY;  Service: Endoscopy;  Laterality: N/A;   TONSILLECTOMY      Allergies  Allergen Reactions   Amoxil [Amoxicillin]    Sulfa Antibiotics    Tylenol [Acetaminophen]     ?reaction     Ampicillin Rash   Elemental Sulfur Rash   Keflex [Cephalexin] Rash   Macrobid [Nitrofurantoin] Rash   Ofloxacin Rash   Penicillins Rash    Allergies as of 01/04/2023       Reactions   Amoxil [amoxicillin]    Sulfa Antibiotics    Tylenol [acetaminophen]    ?reaction    Ampicillin Rash   Elemental Sulfur Rash   Keflex [cephalexin] Rash   Macrobid [nitrofurantoin] Rash   Ofloxacin Rash   Penicillins Rash  Medication List        Accurate as of January 04, 2023  5:15 PM. If you have any questions, ask your nurse or doctor.          aluminum-magnesium hydroxide-simethicone 315-400-86 MG/5ML Susp Commonly known as: MAALOX Give 2 Tbsp by mouth every 4 hours as needed for gas, indigestion, or upset stomach Notify MD if no relief   Artificial Tears ophthalmic solution Place 1 drop into both eyes 2 (two) times daily as needed (For Dry Eyes).   donepezil 10 MG tablet Commonly known as: ARICEPT Take 10 mg by mouth at bedtime.   ibuprofen 400 MG tablet Commonly known as: ADVIL Take 400 mg by mouth 2 (two) times daily as needed.   loratadine 10 MG tablet Commonly known as:  CLARITIN Take 10 mg by mouth daily.   memantine 10 MG tablet Commonly known as: NAMENDA Take 10 mg by mouth 2 (two) times daily.   mirtazapine 7.5 MG tablet Commonly known as: REMERON Take 1 tablet (7.5 mg total) by mouth at bedtime.   polyethylene glycol powder 17 GM/SCOOP powder Commonly known as: GLYCOLAX/MIRALAX Take 1 Container by mouth every other day.   senna-docusate 8.6-50 MG tablet Commonly known as: Senokot-S Take 1 tablet by mouth daily as needed for mild constipation.        Review of Systems  Unable to obtain due to dementia.  Immunization History  Administered Date(s) Administered   Fluad Quad(high Dose 65+) 08/28/2019   Hep A / Hep B 09/03/2019   Influenza, High Dose Seasonal PF 09/09/2020, 08/28/2021, 09/27/2022   Influenza-Unspecified 09/11/2015   Moderna Covid-19 Vaccine Bivalent Booster 28yr & up 05/09/2022   Moderna Sars-Covid-2 Vaccination 12/23/2019, 01/20/2020, 10/26/2020   Pfizer Covid-19 Vaccine Bivalent Booster 174yr& up 09/02/2021   Pneumococcal Conjugate-13 11/23/2020   Pneumococcal Polysaccharide-23 09/11/2015   Tdap 12/12/2011   Zoster, Live 12/12/2003   Pertinent  Health Maintenance Due  Topic Date Due   INFLUENZA VACCINE  Completed   DEXA SCAN  Completed   COLONOSCOPY (Pts 45-4921yrnsurance coverage will need to be confirmed)  Discontinued      05/07/2021   11:15 AM 05/10/2021   11:28 AM 07/06/2021    3:17 PM 07/25/2021   11:35 AM 10/11/2021    3:26 PM  Fall Risk  Falls in the past year?  0 0 0 0  Was there an injury with Fall?  0 0  0  Fall Risk Category Calculator  0 0  0  Fall Risk Category (Retired)  Low Low  Low  (RETIRED) Patient Fall Risk Level Low fall risk Low fall risk Low fall risk Low fall risk Low fall risk  Patient at Risk for Falls Due to  Impaired balance/gait   No Fall Risks  Fall risk Follow up  Falls evaluation completed Falls evaluation completed Falls evaluation completed Falls evaluation completed    Functional Status Survey:    Vitals:   01/04/23 1416  BP: (!) 147/79  Pulse: 64  Resp: 12  Temp: 97.8 F (36.6 C)  SpO2: 97%  Weight: 94 lb 8 oz (42.9 kg)  Height: 5' 5.5" (1.664 m)   Body mass index is 15.49 kg/m. Physical Exam Constitutional:      General: She is not in acute distress.    Appearance: Normal appearance.  HENT:     Head: Normocephalic and atraumatic.     Nose: Nose normal.     Mouth/Throat:     Mouth: Mucous membranes  are moist.  Eyes:     Conjunctiva/sclera: Conjunctivae normal.  Cardiovascular:     Rate and Rhythm: Normal rate and regular rhythm.  Pulmonary:     Effort: Pulmonary effort is normal.     Breath sounds: Normal breath sounds.  Abdominal:     General: Bowel sounds are normal.     Palpations: Abdomen is soft.  Musculoskeletal:        General: Normal range of motion.     Cervical back: Normal range of motion.  Skin:    General: Skin is warm and dry.  Neurological:     Mental Status: She is alert. Mental status is at baseline. She is disoriented.  Psychiatric:     Comments: agitated     Labs reviewed: No results for input(s): "NA", "K", "CL", "CO2", "GLUCOSE", "BUN", "CREATININE", "CALCIUM", "MG", "PHOS" in the last 8760 hours. No results for input(s): "AST", "ALT", "ALKPHOS", "BILITOT", "PROT", "ALBUMIN" in the last 8760 hours. No results for input(s): "WBC", "NEUTROABS", "HGB", "HCT", "MCV", "PLT" in the last 8760 hours. Lab Results  Component Value Date   TSH 1.79 09/17/2020   Lab Results  Component Value Date   HGBA1C 5.6 11/19/2018   Lab Results  Component Value Date   CHOL 188 10/16/2018   HDL 65.70 10/16/2018   LDLCALC 109 (H) 10/16/2018   TRIG 69.0 10/16/2018   CHOLHDL 3 10/16/2018    Significant Diagnostic Results in last 30 days:  No results found.  Assessment/Plan . 1. Agitation -  possibly due to Remeron increased -  will decrease Remeron to 7.5 mg at HS -  will get urinalysis with culture and  sensitivity to rule out UTI  2. Mixed Alzheimer's and vascular dementia ([place]) -  continue Memantine and Aricept -  continue supportive care -  fall precautions   Family/ staff Communication:  Discussed plan of care charge nurse.  Labs/tests ordered:   urinalysis with culture and sensitivity

## 2023-01-05 DIAGNOSIS — A6920 Lyme disease, unspecified: Secondary | ICD-10-CM | POA: Diagnosis not present

## 2023-01-05 DIAGNOSIS — Z9181 History of falling: Secondary | ICD-10-CM | POA: Diagnosis not present

## 2023-01-05 DIAGNOSIS — R2689 Other abnormalities of gait and mobility: Secondary | ICD-10-CM | POA: Diagnosis not present

## 2023-01-05 DIAGNOSIS — G301 Alzheimer's disease with late onset: Secondary | ICD-10-CM | POA: Diagnosis not present

## 2023-01-05 DIAGNOSIS — R4189 Other symptoms and signs involving cognitive functions and awareness: Secondary | ICD-10-CM | POA: Diagnosis not present

## 2023-01-05 DIAGNOSIS — R278 Other lack of coordination: Secondary | ICD-10-CM | POA: Diagnosis not present

## 2023-01-08 DIAGNOSIS — R4189 Other symptoms and signs involving cognitive functions and awareness: Secondary | ICD-10-CM | POA: Diagnosis not present

## 2023-01-08 DIAGNOSIS — R2689 Other abnormalities of gait and mobility: Secondary | ICD-10-CM | POA: Diagnosis not present

## 2023-01-08 DIAGNOSIS — R278 Other lack of coordination: Secondary | ICD-10-CM | POA: Diagnosis not present

## 2023-01-08 DIAGNOSIS — Z9181 History of falling: Secondary | ICD-10-CM | POA: Diagnosis not present

## 2023-01-08 DIAGNOSIS — G301 Alzheimer's disease with late onset: Secondary | ICD-10-CM | POA: Diagnosis not present

## 2023-01-08 DIAGNOSIS — A6920 Lyme disease, unspecified: Secondary | ICD-10-CM | POA: Diagnosis not present

## 2023-01-10 DIAGNOSIS — R278 Other lack of coordination: Secondary | ICD-10-CM | POA: Diagnosis not present

## 2023-01-10 DIAGNOSIS — Z9181 History of falling: Secondary | ICD-10-CM | POA: Diagnosis not present

## 2023-01-10 DIAGNOSIS — A6920 Lyme disease, unspecified: Secondary | ICD-10-CM | POA: Diagnosis not present

## 2023-01-10 DIAGNOSIS — R2689 Other abnormalities of gait and mobility: Secondary | ICD-10-CM | POA: Diagnosis not present

## 2023-01-10 DIAGNOSIS — R4189 Other symptoms and signs involving cognitive functions and awareness: Secondary | ICD-10-CM | POA: Diagnosis not present

## 2023-01-10 DIAGNOSIS — G301 Alzheimer's disease with late onset: Secondary | ICD-10-CM | POA: Diagnosis not present

## 2023-01-12 DIAGNOSIS — Z9181 History of falling: Secondary | ICD-10-CM | POA: Diagnosis not present

## 2023-01-12 DIAGNOSIS — Z741 Need for assistance with personal care: Secondary | ICD-10-CM | POA: Diagnosis not present

## 2023-01-12 DIAGNOSIS — R4189 Other symptoms and signs involving cognitive functions and awareness: Secondary | ICD-10-CM | POA: Diagnosis not present

## 2023-01-12 DIAGNOSIS — R1312 Dysphagia, oropharyngeal phase: Secondary | ICD-10-CM | POA: Diagnosis not present

## 2023-01-12 DIAGNOSIS — A6920 Lyme disease, unspecified: Secondary | ICD-10-CM | POA: Diagnosis not present

## 2023-01-12 DIAGNOSIS — G301 Alzheimer's disease with late onset: Secondary | ICD-10-CM | POA: Diagnosis not present

## 2023-01-12 DIAGNOSIS — R278 Other lack of coordination: Secondary | ICD-10-CM | POA: Diagnosis not present

## 2023-01-12 DIAGNOSIS — M6281 Muscle weakness (generalized): Secondary | ICD-10-CM | POA: Diagnosis not present

## 2023-01-12 DIAGNOSIS — R2689 Other abnormalities of gait and mobility: Secondary | ICD-10-CM | POA: Diagnosis not present

## 2023-01-16 DIAGNOSIS — R2689 Other abnormalities of gait and mobility: Secondary | ICD-10-CM | POA: Diagnosis not present

## 2023-01-16 DIAGNOSIS — G301 Alzheimer's disease with late onset: Secondary | ICD-10-CM | POA: Diagnosis not present

## 2023-01-16 DIAGNOSIS — R278 Other lack of coordination: Secondary | ICD-10-CM | POA: Diagnosis not present

## 2023-01-16 DIAGNOSIS — R4189 Other symptoms and signs involving cognitive functions and awareness: Secondary | ICD-10-CM | POA: Diagnosis not present

## 2023-01-16 DIAGNOSIS — Z9181 History of falling: Secondary | ICD-10-CM | POA: Diagnosis not present

## 2023-01-16 DIAGNOSIS — A6920 Lyme disease, unspecified: Secondary | ICD-10-CM | POA: Diagnosis not present

## 2023-01-18 DIAGNOSIS — R278 Other lack of coordination: Secondary | ICD-10-CM | POA: Diagnosis not present

## 2023-01-18 DIAGNOSIS — R2689 Other abnormalities of gait and mobility: Secondary | ICD-10-CM | POA: Diagnosis not present

## 2023-01-18 DIAGNOSIS — Z9181 History of falling: Secondary | ICD-10-CM | POA: Diagnosis not present

## 2023-01-18 DIAGNOSIS — R4189 Other symptoms and signs involving cognitive functions and awareness: Secondary | ICD-10-CM | POA: Diagnosis not present

## 2023-01-18 DIAGNOSIS — A6920 Lyme disease, unspecified: Secondary | ICD-10-CM | POA: Diagnosis not present

## 2023-01-18 DIAGNOSIS — G301 Alzheimer's disease with late onset: Secondary | ICD-10-CM | POA: Diagnosis not present

## 2023-01-23 DIAGNOSIS — G301 Alzheimer's disease with late onset: Secondary | ICD-10-CM | POA: Diagnosis not present

## 2023-01-23 DIAGNOSIS — Z9181 History of falling: Secondary | ICD-10-CM | POA: Diagnosis not present

## 2023-01-23 DIAGNOSIS — A6920 Lyme disease, unspecified: Secondary | ICD-10-CM | POA: Diagnosis not present

## 2023-01-23 DIAGNOSIS — R278 Other lack of coordination: Secondary | ICD-10-CM | POA: Diagnosis not present

## 2023-01-23 DIAGNOSIS — R2689 Other abnormalities of gait and mobility: Secondary | ICD-10-CM | POA: Diagnosis not present

## 2023-01-23 DIAGNOSIS — R4189 Other symptoms and signs involving cognitive functions and awareness: Secondary | ICD-10-CM | POA: Diagnosis not present

## 2023-01-26 DIAGNOSIS — G301 Alzheimer's disease with late onset: Secondary | ICD-10-CM | POA: Diagnosis not present

## 2023-01-26 DIAGNOSIS — R4189 Other symptoms and signs involving cognitive functions and awareness: Secondary | ICD-10-CM | POA: Diagnosis not present

## 2023-01-26 DIAGNOSIS — R2689 Other abnormalities of gait and mobility: Secondary | ICD-10-CM | POA: Diagnosis not present

## 2023-01-26 DIAGNOSIS — Z9181 History of falling: Secondary | ICD-10-CM | POA: Diagnosis not present

## 2023-01-26 DIAGNOSIS — A6920 Lyme disease, unspecified: Secondary | ICD-10-CM | POA: Diagnosis not present

## 2023-01-26 DIAGNOSIS — R278 Other lack of coordination: Secondary | ICD-10-CM | POA: Diagnosis not present

## 2023-01-30 DIAGNOSIS — R2689 Other abnormalities of gait and mobility: Secondary | ICD-10-CM | POA: Diagnosis not present

## 2023-01-30 DIAGNOSIS — R4189 Other symptoms and signs involving cognitive functions and awareness: Secondary | ICD-10-CM | POA: Diagnosis not present

## 2023-01-30 DIAGNOSIS — A6920 Lyme disease, unspecified: Secondary | ICD-10-CM | POA: Diagnosis not present

## 2023-01-30 DIAGNOSIS — Z9181 History of falling: Secondary | ICD-10-CM | POA: Diagnosis not present

## 2023-01-30 DIAGNOSIS — G301 Alzheimer's disease with late onset: Secondary | ICD-10-CM | POA: Diagnosis not present

## 2023-01-30 DIAGNOSIS — R278 Other lack of coordination: Secondary | ICD-10-CM | POA: Diagnosis not present

## 2023-01-31 DIAGNOSIS — G301 Alzheimer's disease with late onset: Secondary | ICD-10-CM | POA: Diagnosis not present

## 2023-01-31 DIAGNOSIS — Z9181 History of falling: Secondary | ICD-10-CM | POA: Diagnosis not present

## 2023-01-31 DIAGNOSIS — R278 Other lack of coordination: Secondary | ICD-10-CM | POA: Diagnosis not present

## 2023-01-31 DIAGNOSIS — A6920 Lyme disease, unspecified: Secondary | ICD-10-CM | POA: Diagnosis not present

## 2023-01-31 DIAGNOSIS — R2689 Other abnormalities of gait and mobility: Secondary | ICD-10-CM | POA: Diagnosis not present

## 2023-01-31 DIAGNOSIS — R4189 Other symptoms and signs involving cognitive functions and awareness: Secondary | ICD-10-CM | POA: Diagnosis not present

## 2023-02-01 DIAGNOSIS — R278 Other lack of coordination: Secondary | ICD-10-CM | POA: Diagnosis not present

## 2023-02-01 DIAGNOSIS — R2689 Other abnormalities of gait and mobility: Secondary | ICD-10-CM | POA: Diagnosis not present

## 2023-02-01 DIAGNOSIS — A6920 Lyme disease, unspecified: Secondary | ICD-10-CM | POA: Diagnosis not present

## 2023-02-01 DIAGNOSIS — Z9181 History of falling: Secondary | ICD-10-CM | POA: Diagnosis not present

## 2023-02-01 DIAGNOSIS — R4189 Other symptoms and signs involving cognitive functions and awareness: Secondary | ICD-10-CM | POA: Diagnosis not present

## 2023-02-01 DIAGNOSIS — H2513 Age-related nuclear cataract, bilateral: Secondary | ICD-10-CM | POA: Diagnosis not present

## 2023-02-01 DIAGNOSIS — G301 Alzheimer's disease with late onset: Secondary | ICD-10-CM | POA: Diagnosis not present

## 2023-02-06 DIAGNOSIS — G301 Alzheimer's disease with late onset: Secondary | ICD-10-CM | POA: Diagnosis not present

## 2023-02-06 DIAGNOSIS — R2689 Other abnormalities of gait and mobility: Secondary | ICD-10-CM | POA: Diagnosis not present

## 2023-02-06 DIAGNOSIS — Z9181 History of falling: Secondary | ICD-10-CM | POA: Diagnosis not present

## 2023-02-06 DIAGNOSIS — A6920 Lyme disease, unspecified: Secondary | ICD-10-CM | POA: Diagnosis not present

## 2023-02-06 DIAGNOSIS — R4189 Other symptoms and signs involving cognitive functions and awareness: Secondary | ICD-10-CM | POA: Diagnosis not present

## 2023-02-06 DIAGNOSIS — R278 Other lack of coordination: Secondary | ICD-10-CM | POA: Diagnosis not present

## 2023-02-07 DIAGNOSIS — R2689 Other abnormalities of gait and mobility: Secondary | ICD-10-CM | POA: Diagnosis not present

## 2023-02-07 DIAGNOSIS — Z9181 History of falling: Secondary | ICD-10-CM | POA: Diagnosis not present

## 2023-02-07 DIAGNOSIS — R278 Other lack of coordination: Secondary | ICD-10-CM | POA: Diagnosis not present

## 2023-02-07 DIAGNOSIS — R4189 Other symptoms and signs involving cognitive functions and awareness: Secondary | ICD-10-CM | POA: Diagnosis not present

## 2023-02-07 DIAGNOSIS — G301 Alzheimer's disease with late onset: Secondary | ICD-10-CM | POA: Diagnosis not present

## 2023-02-07 DIAGNOSIS — A6920 Lyme disease, unspecified: Secondary | ICD-10-CM | POA: Diagnosis not present

## 2023-02-09 DIAGNOSIS — A6920 Lyme disease, unspecified: Secondary | ICD-10-CM | POA: Diagnosis not present

## 2023-02-09 DIAGNOSIS — G301 Alzheimer's disease with late onset: Secondary | ICD-10-CM | POA: Diagnosis not present

## 2023-02-09 DIAGNOSIS — Z741 Need for assistance with personal care: Secondary | ICD-10-CM | POA: Diagnosis not present

## 2023-02-09 DIAGNOSIS — R2689 Other abnormalities of gait and mobility: Secondary | ICD-10-CM | POA: Diagnosis not present

## 2023-02-09 DIAGNOSIS — M6281 Muscle weakness (generalized): Secondary | ICD-10-CM | POA: Diagnosis not present

## 2023-02-09 DIAGNOSIS — R1312 Dysphagia, oropharyngeal phase: Secondary | ICD-10-CM | POA: Diagnosis not present

## 2023-02-09 DIAGNOSIS — R278 Other lack of coordination: Secondary | ICD-10-CM | POA: Diagnosis not present

## 2023-02-09 DIAGNOSIS — R4189 Other symptoms and signs involving cognitive functions and awareness: Secondary | ICD-10-CM | POA: Diagnosis not present

## 2023-02-09 DIAGNOSIS — Z9181 History of falling: Secondary | ICD-10-CM | POA: Diagnosis not present

## 2023-02-10 DIAGNOSIS — R4189 Other symptoms and signs involving cognitive functions and awareness: Secondary | ICD-10-CM | POA: Diagnosis not present

## 2023-02-10 DIAGNOSIS — Z9181 History of falling: Secondary | ICD-10-CM | POA: Diagnosis not present

## 2023-02-10 DIAGNOSIS — G301 Alzheimer's disease with late onset: Secondary | ICD-10-CM | POA: Diagnosis not present

## 2023-02-10 DIAGNOSIS — R278 Other lack of coordination: Secondary | ICD-10-CM | POA: Diagnosis not present

## 2023-02-10 DIAGNOSIS — A6920 Lyme disease, unspecified: Secondary | ICD-10-CM | POA: Diagnosis not present

## 2023-02-10 DIAGNOSIS — R2689 Other abnormalities of gait and mobility: Secondary | ICD-10-CM | POA: Diagnosis not present

## 2023-02-12 DIAGNOSIS — R2689 Other abnormalities of gait and mobility: Secondary | ICD-10-CM | POA: Diagnosis not present

## 2023-02-12 DIAGNOSIS — R278 Other lack of coordination: Secondary | ICD-10-CM | POA: Diagnosis not present

## 2023-02-12 DIAGNOSIS — R4189 Other symptoms and signs involving cognitive functions and awareness: Secondary | ICD-10-CM | POA: Diagnosis not present

## 2023-02-12 DIAGNOSIS — Z9181 History of falling: Secondary | ICD-10-CM | POA: Diagnosis not present

## 2023-02-12 DIAGNOSIS — A6920 Lyme disease, unspecified: Secondary | ICD-10-CM | POA: Diagnosis not present

## 2023-02-12 DIAGNOSIS — G301 Alzheimer's disease with late onset: Secondary | ICD-10-CM | POA: Diagnosis not present

## 2023-02-14 DIAGNOSIS — R2689 Other abnormalities of gait and mobility: Secondary | ICD-10-CM | POA: Diagnosis not present

## 2023-02-14 DIAGNOSIS — R278 Other lack of coordination: Secondary | ICD-10-CM | POA: Diagnosis not present

## 2023-02-14 DIAGNOSIS — A6920 Lyme disease, unspecified: Secondary | ICD-10-CM | POA: Diagnosis not present

## 2023-02-14 DIAGNOSIS — Z9181 History of falling: Secondary | ICD-10-CM | POA: Diagnosis not present

## 2023-02-14 DIAGNOSIS — R4189 Other symptoms and signs involving cognitive functions and awareness: Secondary | ICD-10-CM | POA: Diagnosis not present

## 2023-02-14 DIAGNOSIS — G301 Alzheimer's disease with late onset: Secondary | ICD-10-CM | POA: Diagnosis not present

## 2023-02-15 DIAGNOSIS — Z9181 History of falling: Secondary | ICD-10-CM | POA: Diagnosis not present

## 2023-02-15 DIAGNOSIS — A6920 Lyme disease, unspecified: Secondary | ICD-10-CM | POA: Diagnosis not present

## 2023-02-15 DIAGNOSIS — G301 Alzheimer's disease with late onset: Secondary | ICD-10-CM | POA: Diagnosis not present

## 2023-02-15 DIAGNOSIS — R278 Other lack of coordination: Secondary | ICD-10-CM | POA: Diagnosis not present

## 2023-02-15 DIAGNOSIS — R2689 Other abnormalities of gait and mobility: Secondary | ICD-10-CM | POA: Diagnosis not present

## 2023-02-15 DIAGNOSIS — R4189 Other symptoms and signs involving cognitive functions and awareness: Secondary | ICD-10-CM | POA: Diagnosis not present

## 2023-02-16 DIAGNOSIS — R2689 Other abnormalities of gait and mobility: Secondary | ICD-10-CM | POA: Diagnosis not present

## 2023-02-16 DIAGNOSIS — G301 Alzheimer's disease with late onset: Secondary | ICD-10-CM | POA: Diagnosis not present

## 2023-02-16 DIAGNOSIS — A6920 Lyme disease, unspecified: Secondary | ICD-10-CM | POA: Diagnosis not present

## 2023-02-16 DIAGNOSIS — R4189 Other symptoms and signs involving cognitive functions and awareness: Secondary | ICD-10-CM | POA: Diagnosis not present

## 2023-02-16 DIAGNOSIS — R278 Other lack of coordination: Secondary | ICD-10-CM | POA: Diagnosis not present

## 2023-02-16 DIAGNOSIS — Z9181 History of falling: Secondary | ICD-10-CM | POA: Diagnosis not present

## 2023-02-21 DIAGNOSIS — R278 Other lack of coordination: Secondary | ICD-10-CM | POA: Diagnosis not present

## 2023-02-21 DIAGNOSIS — A6920 Lyme disease, unspecified: Secondary | ICD-10-CM | POA: Diagnosis not present

## 2023-02-21 DIAGNOSIS — R4189 Other symptoms and signs involving cognitive functions and awareness: Secondary | ICD-10-CM | POA: Diagnosis not present

## 2023-02-21 DIAGNOSIS — R2689 Other abnormalities of gait and mobility: Secondary | ICD-10-CM | POA: Diagnosis not present

## 2023-02-21 DIAGNOSIS — Z9181 History of falling: Secondary | ICD-10-CM | POA: Diagnosis not present

## 2023-02-21 DIAGNOSIS — G301 Alzheimer's disease with late onset: Secondary | ICD-10-CM | POA: Diagnosis not present

## 2023-02-22 DIAGNOSIS — R4189 Other symptoms and signs involving cognitive functions and awareness: Secondary | ICD-10-CM | POA: Diagnosis not present

## 2023-02-22 DIAGNOSIS — Z9181 History of falling: Secondary | ICD-10-CM | POA: Diagnosis not present

## 2023-02-22 DIAGNOSIS — A6920 Lyme disease, unspecified: Secondary | ICD-10-CM | POA: Diagnosis not present

## 2023-02-22 DIAGNOSIS — R278 Other lack of coordination: Secondary | ICD-10-CM | POA: Diagnosis not present

## 2023-02-22 DIAGNOSIS — G301 Alzheimer's disease with late onset: Secondary | ICD-10-CM | POA: Diagnosis not present

## 2023-02-22 DIAGNOSIS — R2689 Other abnormalities of gait and mobility: Secondary | ICD-10-CM | POA: Diagnosis not present

## 2023-02-26 DIAGNOSIS — R2689 Other abnormalities of gait and mobility: Secondary | ICD-10-CM | POA: Diagnosis not present

## 2023-02-26 DIAGNOSIS — Z9181 History of falling: Secondary | ICD-10-CM | POA: Diagnosis not present

## 2023-02-26 DIAGNOSIS — R278 Other lack of coordination: Secondary | ICD-10-CM | POA: Diagnosis not present

## 2023-02-26 DIAGNOSIS — G301 Alzheimer's disease with late onset: Secondary | ICD-10-CM | POA: Diagnosis not present

## 2023-02-26 DIAGNOSIS — A6920 Lyme disease, unspecified: Secondary | ICD-10-CM | POA: Diagnosis not present

## 2023-02-26 DIAGNOSIS — R4189 Other symptoms and signs involving cognitive functions and awareness: Secondary | ICD-10-CM | POA: Diagnosis not present

## 2023-02-27 DIAGNOSIS — R278 Other lack of coordination: Secondary | ICD-10-CM | POA: Diagnosis not present

## 2023-02-27 DIAGNOSIS — R4189 Other symptoms and signs involving cognitive functions and awareness: Secondary | ICD-10-CM | POA: Diagnosis not present

## 2023-02-27 DIAGNOSIS — R2689 Other abnormalities of gait and mobility: Secondary | ICD-10-CM | POA: Diagnosis not present

## 2023-02-27 DIAGNOSIS — G301 Alzheimer's disease with late onset: Secondary | ICD-10-CM | POA: Diagnosis not present

## 2023-02-27 DIAGNOSIS — A6920 Lyme disease, unspecified: Secondary | ICD-10-CM | POA: Diagnosis not present

## 2023-02-27 DIAGNOSIS — Z9181 History of falling: Secondary | ICD-10-CM | POA: Diagnosis not present

## 2023-02-28 DIAGNOSIS — G301 Alzheimer's disease with late onset: Secondary | ICD-10-CM | POA: Diagnosis not present

## 2023-02-28 DIAGNOSIS — A6920 Lyme disease, unspecified: Secondary | ICD-10-CM | POA: Diagnosis not present

## 2023-02-28 DIAGNOSIS — R278 Other lack of coordination: Secondary | ICD-10-CM | POA: Diagnosis not present

## 2023-02-28 DIAGNOSIS — R4189 Other symptoms and signs involving cognitive functions and awareness: Secondary | ICD-10-CM | POA: Diagnosis not present

## 2023-02-28 DIAGNOSIS — Z9181 History of falling: Secondary | ICD-10-CM | POA: Diagnosis not present

## 2023-02-28 DIAGNOSIS — R2689 Other abnormalities of gait and mobility: Secondary | ICD-10-CM | POA: Diagnosis not present

## 2023-03-01 DIAGNOSIS — R2689 Other abnormalities of gait and mobility: Secondary | ICD-10-CM | POA: Diagnosis not present

## 2023-03-01 DIAGNOSIS — Z9181 History of falling: Secondary | ICD-10-CM | POA: Diagnosis not present

## 2023-03-01 DIAGNOSIS — R278 Other lack of coordination: Secondary | ICD-10-CM | POA: Diagnosis not present

## 2023-03-01 DIAGNOSIS — R4189 Other symptoms and signs involving cognitive functions and awareness: Secondary | ICD-10-CM | POA: Diagnosis not present

## 2023-03-01 DIAGNOSIS — A6920 Lyme disease, unspecified: Secondary | ICD-10-CM | POA: Diagnosis not present

## 2023-03-01 DIAGNOSIS — G301 Alzheimer's disease with late onset: Secondary | ICD-10-CM | POA: Diagnosis not present

## 2023-03-06 DIAGNOSIS — A6920 Lyme disease, unspecified: Secondary | ICD-10-CM | POA: Diagnosis not present

## 2023-03-06 DIAGNOSIS — Z9181 History of falling: Secondary | ICD-10-CM | POA: Diagnosis not present

## 2023-03-06 DIAGNOSIS — R2689 Other abnormalities of gait and mobility: Secondary | ICD-10-CM | POA: Diagnosis not present

## 2023-03-06 DIAGNOSIS — R4189 Other symptoms and signs involving cognitive functions and awareness: Secondary | ICD-10-CM | POA: Diagnosis not present

## 2023-03-06 DIAGNOSIS — R278 Other lack of coordination: Secondary | ICD-10-CM | POA: Diagnosis not present

## 2023-03-06 DIAGNOSIS — G301 Alzheimer's disease with late onset: Secondary | ICD-10-CM | POA: Diagnosis not present

## 2023-03-14 ENCOUNTER — Encounter: Payer: Self-pay | Admitting: Student

## 2023-03-14 ENCOUNTER — Non-Acute Institutional Stay: Payer: Medicare Other | Admitting: Student

## 2023-03-14 DIAGNOSIS — K5900 Constipation, unspecified: Secondary | ICD-10-CM | POA: Diagnosis not present

## 2023-03-14 DIAGNOSIS — F02B18 Dementia in other diseases classified elsewhere, moderate, with other behavioral disturbance: Secondary | ICD-10-CM | POA: Diagnosis not present

## 2023-03-14 DIAGNOSIS — E441 Mild protein-calorie malnutrition: Secondary | ICD-10-CM

## 2023-03-14 DIAGNOSIS — I680 Cerebral amyloid angiopathy: Secondary | ICD-10-CM | POA: Diagnosis not present

## 2023-03-14 DIAGNOSIS — K219 Gastro-esophageal reflux disease without esophagitis: Secondary | ICD-10-CM | POA: Diagnosis not present

## 2023-03-14 DIAGNOSIS — F39 Unspecified mood [affective] disorder: Secondary | ICD-10-CM

## 2023-03-14 DIAGNOSIS — E854 Organ-limited amyloidosis: Secondary | ICD-10-CM

## 2023-03-14 NOTE — Progress Notes (Signed)
Location:  Other [facility].  Nursing [facility] of Service:  ALF [phone]) Provider:  Earnestine MealingBeamer, Eliyanah Elgersma, MD  Patient Care Team: Earnestine MealingBeamer, [name], MD as PCP - General (Family Medicine)  Extended Emergency Contact Information Primary Emergency Contact: [facility] Phone: [phone] Work Phone: [phone] Mobile Phone: [phone] Relation: Other Secondary Emergency Contact: riggins,joseph [place] Phone: [phone] Mobile Phone: [phone] Relation: Brother  Code Status:  DNR Goals of care: Advanced Directive information    03/14/2023    8:41 AM  Advanced Directives  Does Patient Have a Medical Advance Directive? Yes  Type of Estate agentAdvance Directive Healthcare Power of [place];Out of facility DNR (pink MOST or yellow form)  Does patient want to make changes to medical advance directive? No - Patient declined  Copy of Healthcare Power of Attorney in Chart? Yes - validated most recent copy scanned in chart (See row information)     Chief Complaint  Patient presents with   Medical Management of Chronic Issues    Medical Management of Chronic Issues.     HPI:  Pt is a 77 y.o. female seen today for medical management of chronic diseases.    Patient is alert and oriented to self and [facility]. She states it is 2024 and it is March. She knows it's chilly and spring is near. Patient states she has done well as far as she can tell. She has had a lot of memory changes of which she is aware.   She has had stable weight per nursing. She has not had fall since her last  and continues to progress with physical therapy.    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 +    Past Surgical History:  Procedure Laterality Date   ABDOMINAL HYSTERECTOMY     DUB, endometriosis no h/o abnormal pap    APPENDECTOMY     COLONOSCOPY N/A 04/30/2015   Procedure: COLONOSCOPY;  Surgeon: Elnita MaxwellMatthew Gordon Rein, MD;  [facility];  Service: Endoscopy;  Laterality: N/A;   TONSILLECTOMY      Allergies  Allergen Reactions   Amoxil [Amoxicillin]    Sulfa Antibiotics    Tylenol [Acetaminophen]     ?reaction     Ampicillin Rash   Elemental Sulfur Rash   Keflex [Cephalexin] Rash   Macrobid [Nitrofurantoin] Rash   Ofloxacin Rash   Penicillins Rash    Outpatient Encounter Medications as of 03/14/2023  Medication Sig   aluminum-magnesium hydroxide-simethicone (MAALOX) 200-200-20 MG/5ML SUSP Give 2 Tbsp by mouth every 4 hours as needed for gas, indigestion, or upset stomach Notify MD if no relief   Artificial Tears ophthalmic solution Place 1 drop into both eyes 3 (three) times daily as needed (For Dry Eyes).   donepezil (ARICEPT) 10 MG tablet Take 10 mg by mouth at bedtime.   ibuprofen (ADVIL) 400 MG tablet Take 400 mg by mouth 2 (two) times daily as needed.   loratadine (CLARITIN) 10 MG tablet Take 10 mg by mouth daily.   memantine (NAMENDA) 10 MG tablet Take 10 mg by mouth 2 (two) times daily.   mirtazapine (REMERON)  7.5 MG tablet Take 1 tablet (7.5 mg total) by mouth at bedtime.   pantoprazole (PROTONIX) 20 MG tablet Take 20 mg by mouth daily.   polyethylene glycol powder (GLYCOLAX/MIRALAX) 17 GM/SCOOP powder Take 1 Container by mouth every other day.   senna-docusate (SENOKOT-S) 8.6-50 MG tablet Take 1 tablet by mouth daily as needed for mild constipation.   sertraline (ZOLOFT) 50 MG tablet Take 50 mg by mouth daily.   No facility-administered encounter medications on file as of 03/14/2023.    Review of Systems  Immunization History  Administered Date(s) Administered   Fluad Quad(high Dose 65+) 08/28/2019   Hep A / Hep B 09/03/2019   Influenza, High Dose  Seasonal PF 09/09/2020, 08/28/2021, 09/27/2022   Influenza-Unspecified 09/11/2015   Moderna Covid-19 Vaccine Bivalent Booster 96yrs & up 05/09/2022   Moderna Sars-Covid-2 Vaccination 12/23/2019, 01/20/2020, 10/26/2020   Pfizer Covid-19 Vaccine Bivalent Booster 59yrs & up 09/02/2021   Pneumococcal Conjugate-13 11/23/2020   Pneumococcal Polysaccharide-23 09/11/2015   Tdap 12/12/2011   Zoster, Live 12/12/2003   Pertinent  Health Maintenance Due  Topic Date Due   INFLUENZA VACCINE  07/12/2023   DEXA SCAN  Completed   COLONOSCOPY (Pts 45-67yrs Insurance coverage will need to be confirmed)  Discontinued      05/07/2021   11:15 AM 05/10/2021   11:28 AM 07/06/2021    3:17 PM 07/25/2021   11:35 AM 10/11/2021    3:26 PM  Fall Risk  Falls in the past year?  0 0 0 0  Was there an injury with Fall?  0 0  0  Fall Risk Category Calculator  0 0  0  Fall Risk Category (Retired)  Low Low  Low  (RETIRED) Patient Fall Risk Level Low fall risk Low fall risk Low fall risk Low fall risk Low fall risk  Patient at Risk for Falls Due to  Impaired balance/gait   No Fall Risks  Fall risk Follow up  Falls evaluation completed Falls evaluation completed Falls evaluation completed Falls evaluation completed   Functional Status Survey:    Vitals:   03/14/23 0834 03/14/23 0852  BP: (!) 141/70 120/68  Pulse: 64   Resp: 14   Temp: 97.7 F (36.5 C)   SpO2: 97%   Weight: 93 lb 8 oz (42.4 kg)   Height: 5' 5.5" (1.664 m)    Body mass index is 15.32 kg/m. Physical Exam Constitutional:      Comments: thin  Cardiovascular:     Rate and Rhythm: Normal rate.     Pulses: Normal pulses.  Pulmonary:     Effort: Pulmonary effort is normal.  Neurological:     Mental Status: She is alert. Mental status is at baseline.     Labs reviewed: Recent Labs    12/07/22 0000  NA 139  K 3.6  CL 103  CO2 28*  BUN 33*  CREATININE 1.1  CALCIUM 8.7   Recent Labs    12/07/22 0000  AST 33  ALT 19  ALKPHOS 59   ALBUMIN 3.4*   Recent Labs    12/07/22 0000  WBC 4.2  NEUTROABS 2,793.00  HGB 13.1  HCT 38  PLT 200   Lab Results  Component Value Date   TSH 1.79 09/17/2020   Lab Results  Component Value Date   HGBA1C 5.6 11/19/2018   Lab Results  Component Value Date   CHOL 188 10/16/2018   HDL 65.70 10/16/2018   LDLCALC 109 (H) 10/16/2018   TRIG 69.0 10/16/2018  CHOLHDL 3 10/16/2018    Significant Diagnostic Results in last 30 days:  No results found.  Assessment/Plan 1. 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 Recent significant weight loss, however, stabilized with protein supplementation. Concern this is evidence of her progression of disease. Continue Aricept 10 mg and Memantine 10 mg. Continue mirtazapine 7.5 mg to aid with appetite stimulation.   2. Mood disorder Patient's mood is stable with sertraline and mirtazapine. Continue to monitor.   3. Cerebral amyloid angiopathy Patient is not a candidate for anticoagulation due to elevated bleeding risk in the brain. Continue to monitor.   4. Constipation, unspecified constipation type Symptoms well-controlled with daily miralax. CTM.   5. Malnutrition of mild degree Patient's weight is now stable at 93 lbs. Continue mirtazapine for mood and appetite stimulation. Protein supplementation with meals.   6. Gastroesophageal reflux disease without esophagitis Symptoms well controlled with protonix daily.    Family/ staff Communication: nursing  Labs/tests ordered:  none

## 2023-03-16 DIAGNOSIS — R1312 Dysphagia, oropharyngeal phase: Secondary | ICD-10-CM | POA: Diagnosis not present

## 2023-03-16 DIAGNOSIS — A6920 Lyme disease, unspecified: Secondary | ICD-10-CM | POA: Diagnosis not present

## 2023-03-16 DIAGNOSIS — R4189 Other symptoms and signs involving cognitive functions and awareness: Secondary | ICD-10-CM | POA: Diagnosis not present

## 2023-03-16 DIAGNOSIS — Z741 Need for assistance with personal care: Secondary | ICD-10-CM | POA: Diagnosis not present

## 2023-03-16 DIAGNOSIS — M6281 Muscle weakness (generalized): Secondary | ICD-10-CM | POA: Diagnosis not present

## 2023-03-16 DIAGNOSIS — R2689 Other abnormalities of gait and mobility: Secondary | ICD-10-CM | POA: Diagnosis not present

## 2023-03-16 DIAGNOSIS — G301 Alzheimer's disease with late onset: Secondary | ICD-10-CM | POA: Diagnosis not present

## 2023-03-16 DIAGNOSIS — Z9181 History of falling: Secondary | ICD-10-CM | POA: Diagnosis not present

## 2023-03-16 DIAGNOSIS — R278 Other lack of coordination: Secondary | ICD-10-CM | POA: Diagnosis not present

## 2023-03-16 DIAGNOSIS — F03A18 Unspecified dementia, mild, with other behavioral disturbance: Secondary | ICD-10-CM | POA: Insufficient documentation

## 2023-03-21 DIAGNOSIS — R4189 Other symptoms and signs involving cognitive functions and awareness: Secondary | ICD-10-CM | POA: Diagnosis not present

## 2023-03-21 DIAGNOSIS — Z9181 History of falling: Secondary | ICD-10-CM | POA: Diagnosis not present

## 2023-03-21 DIAGNOSIS — R278 Other lack of coordination: Secondary | ICD-10-CM | POA: Diagnosis not present

## 2023-03-21 DIAGNOSIS — R2689 Other abnormalities of gait and mobility: Secondary | ICD-10-CM | POA: Diagnosis not present

## 2023-03-21 DIAGNOSIS — A6920 Lyme disease, unspecified: Secondary | ICD-10-CM | POA: Diagnosis not present

## 2023-03-21 DIAGNOSIS — G301 Alzheimer's disease with late onset: Secondary | ICD-10-CM | POA: Diagnosis not present

## 2023-03-22 DIAGNOSIS — R4189 Other symptoms and signs involving cognitive functions and awareness: Secondary | ICD-10-CM | POA: Diagnosis not present

## 2023-03-22 DIAGNOSIS — R278 Other lack of coordination: Secondary | ICD-10-CM | POA: Diagnosis not present

## 2023-03-22 DIAGNOSIS — A6920 Lyme disease, unspecified: Secondary | ICD-10-CM | POA: Diagnosis not present

## 2023-03-22 DIAGNOSIS — Z9181 History of falling: Secondary | ICD-10-CM | POA: Diagnosis not present

## 2023-03-22 DIAGNOSIS — G301 Alzheimer's disease with late onset: Secondary | ICD-10-CM | POA: Diagnosis not present

## 2023-03-22 DIAGNOSIS — R2689 Other abnormalities of gait and mobility: Secondary | ICD-10-CM | POA: Diagnosis not present

## 2023-04-08 ENCOUNTER — Telehealth: Payer: Medicare Other | Admitting: Nurse Practitioner

## 2023-04-08 DIAGNOSIS — M25511 Pain in right shoulder: Secondary | ICD-10-CM | POA: Diagnosis not present

## 2023-04-08 NOTE — Telephone Encounter (Signed)
Called and reported fall, resulted R shoulder pain, Xray showed nondisplaced clavicle fracture, recommended stabilizing right arm, f/u Ortho ASAP, evaluated by PCP in am.

## 2023-04-09 ENCOUNTER — Non-Acute Institutional Stay: Payer: Medicare Other | Admitting: Student

## 2023-04-09 ENCOUNTER — Encounter: Payer: Self-pay | Admitting: Student

## 2023-04-09 DIAGNOSIS — S42021A Displaced fracture of shaft of right clavicle, initial encounter for closed fracture: Secondary | ICD-10-CM | POA: Diagnosis not present

## 2023-04-09 NOTE — Progress Notes (Unsigned)
Location:  Other [facility].  Nursing Home Room Number: [facility] 108P Place of Service:  ALF [phone]) Provider: Earnestine Mealing, MD  Patient Care Team: Earnestine Mealing, MD as PCP - General (Family Medicine)  Extended Emergency Contact Information Primary Emergency Contact: [facility] Phone: [phone] Work Phone: [phone] Mobile Phone: [phone] Relation: Other Secondary Emergency Contact: riggins,joseph [place] Home Phone: [phone] Mobile Phone: [phone] Relation: Brother  Code Status:  DNR Goals of care: Advanced Directive information    04/09/2023    8:45 AM  Advanced Directives  Does Patient Have a Medical Advance Directive? Yes  Type of Estate agent of [place];Out of facility DNR (pink MOST or yellow form)  Does patient want to make changes to medical advance directive? No - Patient declined  Copy of Healthcare Power of Attorney in Chart? Yes - validated most recent copy scanned in chart (See row information)     Chief Complaint  Patient presents with   Acute Visit    Clavicle Fracture    HPI:  Pt is a 77 y.o. female seen today for an acute visit for follow up after clavicle fracture. Patient had a fall. Due to dementia, unclear story told. She says she hurt her elbow while she was slipping on her comforter.   Nursing states patient has not had increased pain at this time. Plan to send to ortho urgent care for support in management.    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 +   Past Surgical History:  Procedure Laterality Date   ABDOMINAL HYSTERECTOMY     DUB, endometriosis no h/o abnormal pap    APPENDECTOMY     COLONOSCOPY N/A  04/30/2015   Procedure: COLONOSCOPY;  Surgeon: Elnita Maxwell, MD;  Location: [facility] ENDOSCOPY;  Service: Endoscopy;  Laterality: N/A;   TONSILLECTOMY      Allergies  Allergen Reactions   Amoxil [Amoxicillin]    Sulfa Antibiotics    Tylenol [Acetaminophen]     ?reaction     Ampicillin Rash   Elemental Sulfur Rash   Keflex [Cephalexin] Rash   Macrobid [Nitrofurantoin] Rash   Ofloxacin Rash   Penicillins Rash    Outpatient Encounter Medications as of 04/09/2023  Medication Sig   aluminum-magnesium hydroxide-simethicone (MAALOX) 200-200-20 MG/5ML SUSP Give 2 Tbsp by mouth every 4 hours as needed for gas, indigestion, or upset stomach Notify MD if no relief   Artificial Tears ophthalmic solution Place 1 drop into both eyes 3 (three) times daily as needed (For Dry Eyes).   donepezil (ARICEPT) 10 MG tablet Take 10 mg by mouth at bedtime.   ibuprofen (ADVIL) 400 MG tablet Take 400 mg by mouth 2 (two) times daily as needed.   loratadine (CLARITIN) 10 MG tablet Take 10 mg by mouth daily.   memantine (NAMENDA) 10 MG tablet Take 10 mg by mouth 2 (two) times daily.   mirtazapine (REMERON) 7.5 MG tablet Take 1 tablet (7.5 mg total) by mouth at bedtime.   pantoprazole (PROTONIX) 20 MG tablet Take 20 mg by mouth daily.   polyethylene glycol powder (GLYCOLAX/MIRALAX) 17 GM/SCOOP powder Take 1  Container by mouth every other day.   senna-docusate (SENOKOT-S) 8.6-50 MG tablet Take 1 tablet by mouth daily as needed for mild constipation.   sertraline (ZOLOFT) 50 MG tablet Take 50 mg by mouth daily.   No facility-administered encounter medications on file as of 04/09/2023.    Review of Systems  Immunization History  Administered Date(s) Administered   Covid-19, Mrna,Vaccine(Spikevax)58yrs and older 03/20/2023   Fluad Quad(high Dose 65+) 08/28/2019   Hep A / Hep B 09/03/2019   Influenza, High Dose Seasonal PF 09/09/2020, 08/28/2021, 09/27/2022   Influenza-Unspecified 09/11/2015   Moderna  Covid-19 Vaccine Bivalent Booster 49yrs & up 05/09/2022   Moderna Sars-Covid-2 Vaccination 12/23/2019, 01/20/2020, 10/26/2020   Pfizer Covid-19 Vaccine Bivalent Booster 39yrs & up 09/02/2021   Pneumococcal Conjugate-13 11/23/2020   Pneumococcal Polysaccharide-23 09/11/2015   Tdap 12/12/2011   Zoster, Live 12/12/2003   Pertinent  Health Maintenance Due  Topic Date Due   INFLUENZA VACCINE  07/12/2023   DEXA SCAN  Completed   COLONOSCOPY (Pts 45-70yrs Insurance coverage will need to be confirmed)  Discontinued      05/10/2021   11:28 AM 07/06/2021    3:17 PM 07/25/2021   11:35 AM 10/11/2021    3:26 PM 03/16/2023    8:22 PM  Fall Risk  Falls in the past year? 0 0 0 0 1  Was there an injury with Fall? 0 0  0 0  Fall Risk Category Calculator 0 0  0 2  Fall Risk Category (Retired) Low Low  Low   (RETIRED) Patient Fall Risk Level Low fall risk Low fall risk Low fall risk Low fall risk   Patient at Risk for Falls Due to Impaired balance/gait   No Fall Risks History of fall(s);Impaired balance/gait;Impaired mobility  Fall risk Follow up Falls evaluation completed Falls evaluation completed Falls evaluation completed Falls evaluation completed Falls evaluation completed   Functional Status Survey:    Vitals:   04/09/23 0841  BP: 134/81  Pulse: 85  Resp: 15  Temp: 98 F (36.7 C)  SpO2: 97%  Weight: 92 lb 3.2 oz (41.8 kg)  Height: 5' 5.5" (1.664 m)   Body mass index is 15.11 kg/m. Physical Exam Musculoskeletal:     Comments: Right shoulder with extensive bruising with shoulder imobilizer in place. 2+ pulses in bilateral radial pulses.   Neurological:     Mental Status: She is alert. Mental status is at baseline. She is disoriented.     Labs reviewed: Recent Labs    12/07/22 0000  NA 139  K 3.6  CL 103  CO2 28*  BUN 33*  CREATININE 1.1  CALCIUM 8.7   Recent Labs    12/07/22 0000  AST 33  ALT 19  ALKPHOS 59  ALBUMIN 3.4*   Recent Labs    12/07/22 0000  WBC 4.2   NEUTROABS 2,793.00  HGB 13.1  HCT 38  PLT 200   Lab Results  Component Value Date   TSH 1.79 09/17/2020   Lab Results  Component Value Date   HGBA1C 5.6 11/19/2018   Lab Results  Component Value Date   CHOL 188 10/16/2018   HDL 65.70 10/16/2018   LDLCALC 109 (H) 10/16/2018   TRIG 69.0 10/16/2018   CHOLHDL 3 10/16/2018    Significant Diagnostic Results in last 30 days:  No results found.  Assessment/Plan Closed displaced fracture of shaft of right clavicle, initial encounter Patient with ground level fall found to have clavicle fracture on facility x-ray. Plan to  have follow up with ortho outpatient clinic. Pain well-controlled at this time, continue tylenol TID.   Family/ staff Communication: nursing  Labs/tests ordered:  none

## 2023-04-23 DIAGNOSIS — S42031A Displaced fracture of lateral end of right clavicle, initial encounter for closed fracture: Secondary | ICD-10-CM | POA: Diagnosis not present

## 2023-04-25 DIAGNOSIS — G309 Alzheimer's disease, unspecified: Secondary | ICD-10-CM | POA: Diagnosis not present

## 2023-04-25 DIAGNOSIS — R1312 Dysphagia, oropharyngeal phase: Secondary | ICD-10-CM | POA: Diagnosis not present

## 2023-04-26 DIAGNOSIS — G309 Alzheimer's disease, unspecified: Secondary | ICD-10-CM | POA: Diagnosis not present

## 2023-04-26 DIAGNOSIS — R1312 Dysphagia, oropharyngeal phase: Secondary | ICD-10-CM | POA: Diagnosis not present

## 2023-04-29 DIAGNOSIS — G309 Alzheimer's disease, unspecified: Secondary | ICD-10-CM | POA: Diagnosis not present

## 2023-04-29 DIAGNOSIS — R1312 Dysphagia, oropharyngeal phase: Secondary | ICD-10-CM | POA: Diagnosis not present

## 2023-04-30 DIAGNOSIS — R1312 Dysphagia, oropharyngeal phase: Secondary | ICD-10-CM | POA: Diagnosis not present

## 2023-04-30 DIAGNOSIS — G309 Alzheimer's disease, unspecified: Secondary | ICD-10-CM | POA: Diagnosis not present

## 2023-05-08 ENCOUNTER — Encounter: Payer: Self-pay | Admitting: Nurse Practitioner

## 2023-05-08 ENCOUNTER — Non-Acute Institutional Stay (INDEPENDENT_AMBULATORY_CARE_PROVIDER_SITE_OTHER): Payer: Medicare Other | Admitting: Nurse Practitioner

## 2023-05-08 DIAGNOSIS — Z0000 Encounter for general adult medical examination without abnormal findings: Secondary | ICD-10-CM

## 2023-05-08 NOTE — Progress Notes (Signed)
Subjective:   Evelyn Barker is a 77 y.o. female who presents for Medicare Annual (Subsequent) preventive examination.  Review of Systems     Cardiac Risk Factors include: advanced age (>37men, >56 women)     Objective:    Today's Vitals   05/08/23 1015  BP: 123/67  Pulse: 74  Resp: 14  Temp: 97.6 F (36.4 C)  SpO2: 97%  Weight: 97 lb (44 kg)  Height: 5' 5.5" (1.664 m)   Body mass index is 15.9 kg/m.     05/08/2023   10:25 AM 04/09/2023    8:45 AM 03/14/2023    8:41 AM 01/04/2023    2:19 PM 12/15/2022   10:32 AM 12/15/2022    9:20 AM 12/06/2022    9:26 AM  Advanced Directives  Does Patient Have a Medical Advance Directive? Yes Yes Yes Yes Yes Yes Yes  Type of Estate agent of [place];Out of facility DNR (pink MOST or yellow form) Healthcare Power of [place];Out of facility DNR (pink MOST or yellow form) Healthcare Power of [place];Out of facility DNR (pink MOST or yellow form) Healthcare Power of [place];Out of facility DNR (pink MOST or yellow form) Healthcare Power of [place];Out of facility DNR (pink MOST or yellow form) Healthcare Power of [place];Out of facility DNR (pink MOST or yellow form) Healthcare Power of [place];Out of facility DNR (pink MOST or yellow form)  Does patient want to make changes to medical advance directive? No - Patient declined No - Patient declined No - Patient declined No - Patient declined No - Patient declined No - Patient declined No - Patient declined  Copy of Healthcare Power of Attorney in Chart? Yes - validated most recent copy scanned in chart (See row information) Yes - validated most recent copy scanned in chart (See row information) Yes - validated most recent copy scanned in chart (See row information) Yes - validated most recent copy scanned in chart (See row information) Yes - validated most recent copy scanned in chart (See row information) Yes - validated most recent copy scanned in chart (See row information) Yes  - validated most recent copy scanned in chart (See row information)  Pre-existing out of facility DNR order (yellow form or pink MOST form)    Yellow form placed in chart (order not valid for inpatient use) Yellow form placed in chart (order not valid for inpatient use) Yellow form placed in chart (order not valid for inpatient use)     Current Medications (verified) Outpatient Encounter Medications as of 05/08/2023  Medication Sig   aluminum-magnesium hydroxide-simethicone (MAALOX) 200-200-20 MG/5ML SUSP Give 2 Tbsp by mouth every 4 hours as needed for gas, indigestion, or upset stomach Notify MD if no relief   Artificial Tears ophthalmic solution Place 1 drop into both eyes 3 (three) times daily as needed (For Dry Eyes).   donepezil (ARICEPT) 10 MG tablet Take 10 mg by mouth at bedtime.   ibuprofen (ADVIL) 400 MG tablet Take 400 mg by mouth 2 (two) times daily as needed.   loratadine (CLARITIN) 10 MG tablet Take 10 mg by mouth daily.   memantine (NAMENDA) 10 MG tablet Take 10 mg by mouth 2 (two) times daily.   mirtazapine (REMERON) 7.5 MG tablet Take 1 tablet (7.5 mg total) by mouth at bedtime.   pantoprazole (PROTONIX) 20 MG tablet Take 20 mg by mouth daily.   polyethylene glycol powder (GLYCOLAX/MIRALAX) 17 GM/SCOOP powder Take 1 Container by mouth every other day.   senna-docusate (SENOKOT-S) 8.6-50  MG tablet Take 1 tablet by mouth daily as needed for mild constipation.   sertraline (ZOLOFT) 50 MG tablet Take 50 mg by mouth daily.   No facility-administered encounter medications on file as of 05/08/2023.    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 +   Past Surgical History:  Procedure Laterality Date   ABDOMINAL HYSTERECTOMY     DUB, endometriosis no h/o abnormal pap    APPENDECTOMY     COLONOSCOPY N/A 04/30/2015   Procedure: COLONOSCOPY;  Surgeon: Elnita Maxwell, MD;  Location: [facility] ENDOSCOPY;  Service: Endoscopy;  Laterality: N/A;   TONSILLECTOMY     Family History  Problem Relation Age of Onset   Stroke Mother    Cancer Father        prostate    Pneumonia Maternal Grandmother    Hip fracture Maternal Grandmother    Stroke Paternal Grandfather    Skin cancer Other    Breast cancer Neg Hx    Social History   Socioeconomic History   Marital status: Widowed    Spouse name: Not on file   Number of children: Not on file   Years of education: Not on file   Highest education level: Not on file  Occupational History   Occupation: Diplomatic Services operational officer    Comment: Retired  Tobacco Use   Smoking status: Never   Smokeless tobacco: Never  Vaping Use   Vaping Use: Never used  Substance and Sexual Activity   Alcohol use: Not Currently   Drug use: Not Currently   Sexual activity: Not Currently    Partners: Male  Other Topics Concern   Not on file  Social History Narrative   Widowed    Grew up in [place] moved around when husband living    1 adopted son---died May 24, 2009   Retired Diplomatic Services operational officer       Has living will   Evelyn Barker is her health care POA   Requests DNR---written 11/07/21   No feeding tube if cognitively unaware   Social Determinants of Health   Financial Resource Strain: Low Risk  (07/25/2021)   Overall Financial Resource Strain (CARDIA)    Difficulty of Paying Living Expenses: Not hard at all  Food Insecurity: No Food Insecurity (07/25/2021)   Hunger Vital Sign    Worried About Running Out of Food in the Last Year: Never true    Ran Out of Food in the Last Year: Never true  Transportation Needs: No  Transportation Needs (07/25/2021)   PRAPARE - Administrator, Civil Service (Medical): No    Lack of Transportation (Non-Medical): No  Physical Activity: Unknown (07/25/2021)   Exercise Vital Sign    Days of Exercise per Week: 0 days    Minutes of Exercise per Session: Not on file  Stress: No Stress Concern Present (07/25/2021)   [employer] of Occupational Health - Occupational Stress Questionnaire    Feeling of Stress : Not at all  Social Connections: Unknown (07/25/2021)   Social Connection and  Isolation Panel [NHANES]    Frequency of Communication with Friends and Family: More than three times a week    Frequency of Social Gatherings with Friends and Family: More than three times a week    Attends Religious Services: Not on file    Active Member of Clubs or Organizations: Not on file    Attends Banker Meetings: Not on file    Marital Status: Widowed    Tobacco Counseling Counseling given: Not Answered   Clinical Intake:  Pre-visit preparation completed: Yes  Pain : No/denies pain     BMI - recorded: 15 Nutritional Status: BMI <19  Underweight Nutritional Risks: None Diabetes: No  How often do you need to have someone help you when you read instructions, pamphlets, or other written materials from your doctor or pharmacy?: 5 - Always  Diabetic?no         Activities of Daily Living    05/08/2023    8:16 AM  In your present state of health, do you have any difficulty performing the following activities:  Difficulty concentrating or making decisions? 1  Walking or climbing stairs? 1  Dressing or bathing? 1  Doing errands, shopping? 1  Preparing Food and eating ? Y  Using the Toilet? Y  In the past six months, have you accidently leaked urine? Y  Do you have problems with loss of bowel control? N  Managing your Medications? Y  Managing your Finances? Y  Housekeeping or managing your Housekeeping? Y    Patient Care Team: Earnestine Mealing, MD as PCP - General (Family Medicine)  Indicate any recent Medical Services you may have received from other than [facility] providers in the past year (date may be approximate).     Assessment:   This is a routine wellness examination for Evelyn Barker.  Hearing/Vision screen No results found.  Dietary issues and exercise activities discussed: Current Exercise Habits: Structured exercise class, Time (Minutes): 30, Frequency (Times/Week): 3, Weekly Exercise (Minutes/Week): 90   Goals Addressed   None    Depression Screen    05/08/2023    8:16 AM 07/25/2021   11:55 AM 07/22/2020   11:22 AM 05/25/2020    9:39 AM 03/03/2020    1:17 PM 11/21/2019    9:25 AM 07/28/2019    1:38 PM  PHQ 2/9 Scores  PHQ - 2 Score  0 0 0 0 0 0  Exception Documentation Other- indicate reason in comment box          Fall Risk    05/08/2023    8:16 AM 03/16/2023    8:22 PM 10/11/2021    3:26 PM 07/25/2021   11:35 AM 07/06/2021    3:17 PM  Fall Risk   Falls in the past year? 1 1 0 0 0  Number falls in past yr: 1 1 0 0 0  Injury with Fall? 0 0 0  0  Risk for fall due to : History of fall(s);Impaired balance/gait;Impaired mobility History of fall(s);Impaired balance/gait;Impaired mobility No Fall Risks    Follow up Falls evaluation completed;Education provided Falls evaluation completed Falls evaluation completed Falls evaluation completed Falls evaluation completed    FALL RISK PREVENTION PERTAINING TO THE HOME:  Any stairs in or around the home? No  If so, are there any without handrails?  na Home free of loose throw rugs in walkways, pet beds, electrical cords, etc? Yes  Adequate lighting in your home to reduce risk of falls? Yes   ASSISTIVE DEVICES UTILIZED  TO PREVENT FALLS:  Life alert? No  Use of a cane, walker or w/c? No  Grab bars in the bathroom? Yes  Shower chair or bench in shower? Yes  Elevated toilet seat or a handicapped toilet? Yes   TIMED UP AND GO:  Was the test performed? No .     Cognitive Function:    05/08/2023    8:18 AM 07/25/2021   11:58 AM  MMSE - Mini Mental State Exam  Not completed: Unable to complete Unable to complete        07/22/2020   11:26 AM  6CIT Screen  What Year? 0 points  What month? 0 points  What time? 0 points  Months in reverse 0 points  Repeat phrase 0 points    Immunizations Immunization History  Administered Date(s) Administered   Covid-19, Mrna,Vaccine(Spikevax)26yrs and older 03/20/2023   Fluad Quad(high Dose 65+) 08/28/2019   Hep A / Hep B 09/03/2019   Influenza, High Dose Seasonal PF 09/09/2020, 08/28/2021, 09/27/2022   Influenza-Unspecified 09/11/2015   Moderna Covid-19 Vaccine Bivalent Booster 56yrs & up 05/09/2022   Moderna Sars-Covid-2 Vaccination 12/23/2019, 01/20/2020, 10/26/2020   Pfizer Covid-19 Vaccine Bivalent Booster 23yrs & up 09/02/2021   Pneumococcal Conjugate-13 11/23/2020   Pneumococcal Polysaccharide-23 09/11/2015   Tdap 12/12/2011   Zoster, Live 12/12/2003    TDAP status: Due, Education has been provided regarding the importance of this vaccine. Advised may receive this vaccine at local pharmacy or Health Dept. Aware to provide a copy of the vaccination record if obtained from local pharmacy or Health Dept. Verbalized acceptance and understanding.  Flu Vaccine status: Up to date  Pneumococcal vaccine status: Up to date  Covid-19 vaccine status: Information provided on how to obtain vaccines.   Qualifies for Shingles Vaccine? Yes   Zostavax completed No   Shingrix Completed?: No.    Education has been provided regarding the importance of this vaccine. Patient has been advised to call insurance company to determine out of pocket expense if they have not yet received this vaccine. Advised may also receive vaccine at local pharmacy or Health Dept. Verbalized acceptance and understanding.  Screening Tests Health Maintenance  Topic Date Due   Zoster Vaccines- Shingrix (1 of 2) Never done    DTaP/Tdap/Td (2 - Td or Tdap) 12/11/2021   COVID-19 Vaccine (7 - 2023-24 season) 05/15/2023   INFLUENZA VACCINE  07/12/2023   Medicare Annual Wellness (AWV)  05/07/2024   Pneumonia Vaccine 92+ Years old  Completed   DEXA SCAN  Completed   Hepatitis C Screening  Completed   HPV VACCINES  Aged Out   Colonoscopy  Discontinued    Health Maintenance  Health Maintenance Due  Topic Date Due   Zoster Vaccines- Shingrix (1 of 2) Never done   DTaP/Tdap/Td (2 - Td or Tdap) 12/11/2021    Colorectal cancer screening: No longer required.   Mammogram status: No longer required due to age.  Bone Density status: Completed 2021. Results reflect: Bone density results: OSTEOPOROSIS.   Lung Cancer Screening: (Low Dose CT Chest recommended if Age 47-80 years, 30 pack-year currently smoking OR have quit w/in 15years.) does not qualify.   Lung Cancer Screening Referral: na  Additional Screening:  Hepatitis C Screening: does qualify; Completed   Vision Screening: Recommended annual ophthalmology exams for early detection of glaucoma and other disorders of the eye. Is the patient up to date with their annual eye exam?  No   Unable due to dementia  Dental Screening: Recommended annual  dental exams for proper oral hygiene  Community Resource Referral / Chronic Care Management: CRR required this visit?  No   CCM required this visit?  No      Plan:     I have personally reviewed and noted the following in the patient's chart:   Medical and social history Use of alcohol, tobacco or illicit drugs  Current medications and supplements including opioid prescriptions. Patient is not currently taking opioid prescriptions. Functional ability and status Nutritional status Physical activity Advanced directives List of other physicians Hospitalizations, surgeries, and ER visits in previous 12 months Vitals Screenings to include cognitive, depression, and falls Referrals and appointments  In  addition, I have reviewed and discussed with patient certain preventive protocols, quality metrics, and best practice recommendations. A written personalized care plan for preventive services as well as general preventive health recommendations were provided to patient.     Sharon Seller, NP   05/08/2023   Place of service: [facility]

## 2023-05-09 DIAGNOSIS — G309 Alzheimer's disease, unspecified: Secondary | ICD-10-CM | POA: Diagnosis not present

## 2023-05-09 DIAGNOSIS — R1312 Dysphagia, oropharyngeal phase: Secondary | ICD-10-CM | POA: Diagnosis not present

## 2023-05-11 DIAGNOSIS — R1312 Dysphagia, oropharyngeal phase: Secondary | ICD-10-CM | POA: Diagnosis not present

## 2023-05-11 DIAGNOSIS — G309 Alzheimer's disease, unspecified: Secondary | ICD-10-CM | POA: Diagnosis not present

## 2023-05-14 ENCOUNTER — Non-Acute Institutional Stay: Payer: Medicare Other | Admitting: Student

## 2023-05-14 ENCOUNTER — Encounter: Payer: Self-pay | Admitting: Student

## 2023-05-14 DIAGNOSIS — D229 Melanocytic nevi, unspecified: Secondary | ICD-10-CM

## 2023-05-14 NOTE — Progress Notes (Signed)
Location:  Other [facility].  Nursing Home Room Number: [facility] 108P Place of Service:  ALF [phone]) Provider:  Earnestine Mealing, MD  Patient Care Team: Earnestine Mealing, MD as PCP - General (Family Medicine)  Extended Emergency Contact Information Primary Emergency Contact: [employer] Phone: [phone] Work Phone: [phone] Mobile Phone: [phone] Relation: Other Secondary Emergency Contact: riggins,joseph [place] Home Phone: [phone] Mobile Phone: [phone] Relation: Brother  Code Status:  DNR Goals of care: Advanced Directive information    05/14/2023    9:15 AM  Advanced Directives  Does Patient Have a Medical Advance Directive? Yes  Type of Estate agent of [place];Out of facility DNR (pink MOST or yellow form)  Does patient want to make changes to medical advance directive? No - Patient declined  Copy of Healthcare Power of Attorney in Chart? Yes - validated most recent copy scanned in chart (See row information)     Chief Complaint  Patient presents with   Acute Visit    Skin Changes.     HPI:  Pt is a 77 y.o. female seen today for an acute visit for Skin Changes.   Nursing with concern patient has abnormal moles on her bottom.   Patient is alert. Denies pain at this time.    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 +   Past Surgical History:  Procedure Laterality Date   ABDOMINAL HYSTERECTOMY     DUB, endometriosis no h/o abnormal pap    APPENDECTOMY     COLONOSCOPY N/A 04/30/2015   Procedure: COLONOSCOPY;  Surgeon: Elnita Maxwell, MD;  Location: [facility] ENDOSCOPY;  Service: Endoscopy;  Laterality: N/A;   TONSILLECTOMY       Allergies  Allergen Reactions   Amoxil [Amoxicillin]    Sulfa Antibiotics    Tylenol [Acetaminophen]     ?reaction     Ampicillin Rash   Elemental Sulfur Rash   Keflex [Cephalexin] Rash   Macrobid [Nitrofurantoin] Rash   Ofloxacin Rash   Penicillins Rash    Outpatient Encounter Medications as of 05/14/2023  Medication Sig   aluminum-magnesium hydroxide-simethicone (MAALOX) 200-200-20 MG/5ML SUSP Give 2 Tbsp by mouth every 4 hours as needed for gas, indigestion, or upset stomach Notify MD if no relief   Artificial Tears ophthalmic solution Place 1 drop into both eyes 3 (three) times daily as needed (For Dry Eyes).   donepezil (ARICEPT) 10 MG tablet Take 10 mg by mouth at bedtime.   ibuprofen (ADVIL) 400 MG tablet Take 400 mg by mouth 2 (two) times daily as needed.   loratadine (CLARITIN) 10 MG tablet Take 10 mg by mouth daily.   memantine (NAMENDA) 10 MG tablet Take 10 mg by mouth 2 (two) times daily.   mirtazapine (REMERON) 7.5 MG tablet Take 1 tablet (7.5 mg total) by mouth at bedtime.   pantoprazole (PROTONIX) 20 MG tablet Take 20 mg by mouth daily.   polyethylene glycol powder (GLYCOLAX/MIRALAX) 17 GM/SCOOP powder Take 1 Container by mouth every other day.   senna-docusate (SENOKOT-S) 8.6-50 MG tablet Take 1 tablet by mouth daily as needed for mild constipation.  sertraline (ZOLOFT) 50 MG tablet Take 50 mg by mouth daily.   No facility-administered encounter medications on file as of 05/14/2023.    Review of Systems  Immunization History  Administered Date(s) Administered   Covid-19, Mrna,Vaccine(Spikevax)77yrs and older 03/20/2023   Fluad Quad(high Dose 65+) 08/28/2019   Hep A / Hep B 09/03/2019   Influenza, High Dose Seasonal PF 09/09/2020, 08/28/2021, 09/27/2022   Influenza-Unspecified 09/11/2015   Moderna Covid-19 Vaccine Bivalent Booster 54yrs & up 05/09/2022   Moderna Sars-Covid-2 Vaccination 12/23/2019, 01/20/2020, 10/26/2020   Pfizer Covid-19 Vaccine Bivalent  Booster 15yrs & up 09/02/2021   Pneumococcal Conjugate-13 11/23/2020   Pneumococcal Polysaccharide-23 09/11/2015   Tdap 12/12/2011   Zoster, Live 12/12/2003   Pertinent  Health Maintenance Due  Topic Date Due   INFLUENZA VACCINE  07/12/2023   DEXA SCAN  Completed   Colonoscopy  Discontinued      07/06/2021    3:17 PM 07/25/2021   11:35 AM 10/11/2021    3:26 PM 03/16/2023    8:22 PM 05/08/2023    8:16 AM  Fall Risk  Falls in the past year? 0 0 0 1 1  Was there an injury with Fall? 0  0 0 0  Fall Risk Category Calculator 0  0 2 2  Fall Risk Category (Retired) Low  Low    (RETIRED) Patient Fall Risk Level Low fall risk Low fall risk Low fall risk    Patient at Risk for Falls Due to   No Fall Risks History of fall(s);Impaired balance/gait;Impaired mobility History of fall(s);Impaired balance/gait;Impaired mobility  Fall risk Follow up Falls evaluation completed Falls evaluation completed Falls evaluation completed Falls evaluation completed Falls evaluation completed;Education provided   Functional Status Survey:    Vitals:   05/14/23 0904 05/14/23 0912  BP: (!) 108/59 (!) 108/59  Pulse: 66   Resp: 16   Temp: 97.9 F (36.6 C)   SpO2: 97%   Weight: 97 lb (44 kg)   Height: 5' 5.5" (1.664 m)    Body mass index is 15.9 kg/m. Physical Exam Skin:    Comments: 2 - 3.5 mm macules of the right buttocks and 1- 3 mm macule of the left buttocks, no bleeding or irregular shape.   Neurological:     Mental Status: She is alert.     Labs reviewed: Recent Labs    12/07/22 0000  NA 139  K 3.6  CL 103  CO2 28*  BUN 33*  CREATININE 1.1  CALCIUM 8.7   Recent Labs    12/07/22 0000  AST 33  ALT 19  ALKPHOS 59  ALBUMIN 3.4*   Recent Labs    12/07/22 0000  WBC 4.2  NEUTROABS 2,793.00  HGB 13.1  HCT 38  PLT 200   Lab Results  Component Value Date   TSH 1.79 09/17/2020   Lab Results  Component Value Date   HGBA1C 5.6 11/19/2018   Lab Results  Component Value Date    CHOL 188 10/16/2018   HDL 65.70 10/16/2018   LDLCALC 109 (H) 10/16/2018   TRIG 69.0 10/16/2018   CHOLHDL 3 10/16/2018    Significant Diagnostic Results in last 30 days:  No results found.  Assessment/Plan Numerous moles Patient with numerous flat moles present on bilateral buttocks. Will continue to monitor for changes and determine need for derm evaluation.    Family/ staff Communication: nursing.   Labs/tests ordered:  none

## 2023-05-15 ENCOUNTER — Encounter: Payer: Self-pay | Admitting: Nurse Practitioner

## 2023-05-15 ENCOUNTER — Non-Acute Institutional Stay: Payer: Medicare Other | Admitting: Nurse Practitioner

## 2023-05-15 DIAGNOSIS — E441 Mild protein-calorie malnutrition: Secondary | ICD-10-CM

## 2023-05-15 DIAGNOSIS — K219 Gastro-esophageal reflux disease without esophagitis: Secondary | ICD-10-CM | POA: Diagnosis not present

## 2023-05-15 DIAGNOSIS — F39 Unspecified mood [affective] disorder: Secondary | ICD-10-CM

## 2023-05-15 DIAGNOSIS — R1310 Dysphagia, unspecified: Secondary | ICD-10-CM

## 2023-05-15 DIAGNOSIS — F02B18 Dementia in other diseases classified elsewhere, moderate, with other behavioral disturbance: Secondary | ICD-10-CM

## 2023-05-15 NOTE — Progress Notes (Unsigned)
Location:  Other [facility].  Nursing Home Room Number: [facility] 108P Place of Service:  ALF [phone]) Abbey Chatters, NP  PCP: Earnestine Mealing, MD  Patient Care Team: Earnestine Mealing, MD as PCP - General (Family Medicine)  Extended Emergency Contact Information Primary Emergency Contact: [employer] Phone: [phone] Work Phone: [phone] Mobile Phone: [phone] Relation: Other Secondary Emergency Contact: riggins,joseph [place] Home Phone: [phone] Mobile Phone: [phone] Relation: Brother  Goals of care: Advanced Directive information    05/15/2023    8:13 AM  Advanced Directives  Does Patient Have a Medical Advance Directive? Yes  Type of Estate agent of [place];Out of facility DNR (pink MOST or yellow form)  Does patient want to make changes to medical advance directive? No - Patient declined  Copy of Healthcare Power of Attorney in Chart? Yes - validated most recent copy scanned in chart (See row information)     Chief Complaint  Patient presents with   Medical Management of Chronic Issues    Medical Management of Chronic Issues.     HPI:  Pt is a 77 y.o. female seen today for medical management of chronic disease.  [facility]  She has been evaluated by ST and now on modified diet due to dysphagia. Still has issues with coughing.  She is very particular with what she eats and eats minimally.  She is on Remeron to stimulate appetite. She has had positive weight gain.   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 +   Past Surgical History:  Procedure Laterality Date   ABDOMINAL HYSTERECTOMY     DUB, endometriosis no h/o  abnormal pap    APPENDECTOMY     COLONOSCOPY N/A 04/30/2015   Procedure: COLONOSCOPY;  Surgeon: Elnita Maxwell, MD;  Location: [facility] ENDOSCOPY;  Service: Endoscopy;  Laterality: N/A;   TONSILLECTOMY      Allergies  Allergen Reactions   Amoxil [Amoxicillin]    Sulfa Antibiotics    Tylenol [Acetaminophen]     ?reaction     Ampicillin Rash   Elemental Sulfur Rash   Keflex [Cephalexin] Rash   Macrobid [Nitrofurantoin] Rash   Ofloxacin Rash   Penicillins Rash    Outpatient Encounter Medications as of 05/15/2023  Medication Sig   aluminum-magnesium hydroxide-simethicone (MAALOX) 200-200-20 MG/5ML SUSP Give 2 Tbsp by mouth every 4 hours as needed for gas, indigestion, or upset stomach Notify MD if no relief   Artificial Tears ophthalmic solution Place 1 drop into both eyes 3 (three) times daily as needed (For Dry Eyes).   donepezil (ARICEPT) 10 MG tablet Take 10 mg by mouth at bedtime.   ibuprofen (ADVIL) 400 MG tablet Take 400 mg by mouth 2 (two) times daily as needed.   loratadine (CLARITIN) 10 MG tablet Take 10 mg by mouth daily.   memantine (NAMENDA) 10 MG tablet Take 10 mg by mouth 2 (two) times daily.   mirtazapine (REMERON) 7.5 MG tablet Take 1 tablet (7.5 mg total) by mouth at bedtime.   pantoprazole (PROTONIX) 20 MG tablet Take 20 mg by mouth daily.  polyethylene glycol powder (GLYCOLAX/MIRALAX) 17 GM/SCOOP powder Take 1 Container by mouth every other day.   senna-docusate (SENOKOT-S) 8.6-50 MG tablet Take 1 tablet by mouth daily as needed for mild constipation.   sertraline (ZOLOFT) 50 MG tablet Take 50 mg by mouth daily.   No facility-administered encounter medications on file as of 05/15/2023.    Review of Systems  Unable to perform ROS: Dementia     Immunization History  Administered Date(s) Administered   Covid-19, Mrna,Vaccine(Spikevax)42yrs and older 03/20/2023   Fluad Quad(high Dose 65+) 08/28/2019   Hep A / Hep B 09/03/2019   Influenza, High Dose Seasonal PF  09/09/2020, 08/28/2021, 09/27/2022   Influenza-Unspecified 09/11/2015   Moderna Covid-19 Vaccine Bivalent Booster 76yrs & up 05/09/2022   Moderna Sars-Covid-2 Vaccination 12/23/2019, 01/20/2020, 10/26/2020   Pfizer Covid-19 Vaccine Bivalent Booster 70yrs & up 09/02/2021   Pneumococcal Conjugate-13 11/23/2020   Pneumococcal Polysaccharide-23 09/11/2015   Tdap 12/12/2011   Zoster, Live 12/12/2003   Pertinent  Health Maintenance Due  Topic Date Due   INFLUENZA VACCINE  07/12/2023   DEXA SCAN  Completed   Colonoscopy  Discontinued      07/06/2021    3:17 PM 07/25/2021   11:35 AM 10/11/2021    3:26 PM 03/16/2023    8:22 PM 05/08/2023    8:16 AM  Fall Risk  Falls in the past year? 0 0 0 1 1  Was there an injury with Fall? 0  0 0 0  Fall Risk Category Calculator 0  0 2 2  Fall Risk Category (Retired) Low  Low    (RETIRED) Patient Fall Risk Level Low fall risk Low fall risk Low fall risk    Patient at Risk for Falls Due to   No Fall Risks History of fall(s);Impaired balance/gait;Impaired mobility History of fall(s);Impaired balance/gait;Impaired mobility  Fall risk Follow up Falls evaluation completed Falls evaluation completed Falls evaluation completed Falls evaluation completed Falls evaluation completed;Education provided   Functional Status Survey:    Vitals:   05/15/23 0803  BP: (!) 108/59  Pulse: 66  Resp: 16  Temp: 97.9 F (36.6 C)  SpO2: 97%  Weight: 97 lb (44 kg)  Height: 5' 5.5" (1.664 m)   Body mass index is 15.9 kg/m. Physical Exam Constitutional:      General: She is not in acute distress.    Appearance: She is well-developed. She is not diaphoretic.  HENT:     Head: Normocephalic and atraumatic.     Mouth/Throat:     Pharynx: No oropharyngeal exudate.  Eyes:     Conjunctiva/sclera: Conjunctivae normal.     Pupils: Pupils are equal, round, and reactive to light.  Cardiovascular:     Rate and Rhythm: Normal rate and regular rhythm.     Heart sounds: Normal  heart sounds.  Pulmonary:     Effort: Pulmonary effort is normal.     Breath sounds: Normal breath sounds.  Abdominal:     General: Bowel sounds are normal.     Palpations: Abdomen is soft.  Musculoskeletal:     Cervical back: Normal range of motion and neck supple.     Right lower leg: No edema.     Left lower leg: No edema.  Skin:    General: Skin is warm and dry.  Neurological:     Mental Status: She is alert. Mental status is at baseline.  Psychiatric:        Mood and Affect: Mood normal.   ***  Labs reviewed:  Recent Labs    12/07/22 0000  NA 139  K 3.6  CL 103  CO2 28*  BUN 33*  CREATININE 1.1  CALCIUM 8.7   Recent Labs    12/07/22 0000  AST 33  ALT 19  ALKPHOS 59  ALBUMIN 3.4*   Recent Labs    12/07/22 0000  WBC 4.2  NEUTROABS 2,793.00  HGB 13.1  HCT 38  PLT 200   Lab Results  Component Value Date   TSH 1.79 09/17/2020   Lab Results  Component Value Date   HGBA1C 5.6 11/19/2018   Lab Results  Component Value Date   CHOL 188 10/16/2018   HDL 65.70 10/16/2018   LDLCALC 109 (H) 10/16/2018   TRIG 69.0 10/16/2018   CHOLHDL 3 10/16/2018    Significant Diagnostic Results in last 30 days:  No results found.  Assessment/Plan 1. Dysphagia, unspecified type ***  2. 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 Mood disorder (HCC) ***  4. Malnutrition of mild degree (HCC) ***  5. Gastroesophageal reflux disease without esophagitis Stable continues on protonix     Imaya Duffy K. Biagio Borg [facility] [phone]

## 2023-05-17 DIAGNOSIS — E441 Mild protein-calorie malnutrition: Secondary | ICD-10-CM | POA: Diagnosis not present

## 2023-05-17 DIAGNOSIS — K219 Gastro-esophageal reflux disease without esophagitis: Secondary | ICD-10-CM | POA: Diagnosis not present

## 2023-05-17 DIAGNOSIS — F0390 Unspecified dementia without behavioral disturbance: Secondary | ICD-10-CM | POA: Diagnosis not present

## 2023-05-17 LAB — COMPREHENSIVE METABOLIC PANEL
Albumin: 3.6 (ref 3.5–5.0)
Calcium: 9 (ref 8.7–10.7)
Globulin: 1.8
eGFR: 76

## 2023-05-17 LAB — BASIC METABOLIC PANEL
BUN: 22 — AB (ref 4–21)
CO2: 30 — AB (ref 13–22)
Chloride: 104 (ref 99–108)
Creatinine: 0.8 (ref 0.5–1.1)
Glucose: 76
Potassium: 4.2 meq/L (ref 3.5–5.1)
Sodium: 138 (ref 137–147)

## 2023-05-17 LAB — CBC AND DIFFERENTIAL
HCT: 36 (ref 36–46)
Hemoglobin: 12.3 (ref 12.0–16.0)
Neutrophils Absolute: 3205
Platelets: 193 10*3/uL (ref 150–400)
WBC: 4.9

## 2023-05-17 LAB — HEPATIC FUNCTION PANEL
ALT: 11 U/L (ref 7–35)
AST: 16 (ref 13–35)
Alkaline Phosphatase: 67 (ref 25–125)
Bilirubin, Total: 0.8

## 2023-05-17 LAB — CBC: RBC: 3.8 — AB (ref 3.87–5.11)

## 2023-05-18 DIAGNOSIS — G309 Alzheimer's disease, unspecified: Secondary | ICD-10-CM | POA: Diagnosis not present

## 2023-05-18 DIAGNOSIS — R1312 Dysphagia, oropharyngeal phase: Secondary | ICD-10-CM | POA: Diagnosis not present

## 2023-06-18 ENCOUNTER — Telehealth (INDEPENDENT_AMBULATORY_CARE_PROVIDER_SITE_OTHER): Payer: Medicare Other | Admitting: Student

## 2023-06-18 DIAGNOSIS — R1310 Dysphagia, unspecified: Secondary | ICD-10-CM

## 2023-06-18 DIAGNOSIS — F02B18 Dementia in other diseases classified elsewhere, moderate, with other behavioral disturbance: Secondary | ICD-10-CM | POA: Diagnosis not present

## 2023-06-18 NOTE — Telephone Encounter (Signed)
[facility] - Memory Care Patient representative - [facility] Planning  Goals of care Discussion with Mliss Fritz [facility] and HCPOA):  He visited back in November. She has lost weight. Her mental acuity has also occurred. Older family members in their family get to this point and they "slip away," Which kind of seems like where she is as well. They know she has medication for depression, but it seems more so the progression of her disease. She is at a point she has lost so much including her memory- her husband and son died close together. "I would hate to keep the body going and not her mind. " Her mother had dementia and she made it clear she would want comfort measures.   He doesn't think any life prolonging measures would be appropriate and would like to transition her to be a DNR. Her brother who is her HCPOA has had heart attacks and cancer and has trouble getting here. He travels here as often as he can.   He wants her to be comfortable and to remain comfortable to the end. NO extraordinary measures and pass with dignity. No heroic interventions, only comfort focused.   At this time patient will had a Do Not Resuscitate and will have comfort measures only in the event she has decline. He would like to advance her diet to a regular diet for her quality of life.     I spent greater than 15 minutes for the care of this patient in telephone time, chart review, clinical documentation, patient education.   Coralyn Helling, MD, [facility] [phone]

## 2023-08-15 ENCOUNTER — Non-Acute Institutional Stay: Payer: Medicare Other | Admitting: Student

## 2023-08-15 ENCOUNTER — Encounter: Payer: Self-pay | Admitting: Student

## 2023-08-15 DIAGNOSIS — E854 Organ-limited amyloidosis: Secondary | ICD-10-CM

## 2023-08-15 DIAGNOSIS — F39 Unspecified mood [affective] disorder: Secondary | ICD-10-CM

## 2023-08-15 DIAGNOSIS — I680 Cerebral amyloid angiopathy: Secondary | ICD-10-CM

## 2023-08-15 DIAGNOSIS — F02B18 Dementia in other diseases classified elsewhere, moderate, with other behavioral disturbance: Secondary | ICD-10-CM

## 2023-08-15 NOTE — Progress Notes (Unsigned)
Location:  [facility].  Nursing Home Room Number: [facility] 108P Place of Service:  ALF [phone]) Provider:  Earnestine Mealing, MD  Patient Care Team: Earnestine Mealing, MD as PCP - General (Family Medicine)  Extended Emergency Contact Information Primary Emergency Contact: [facility] Phone: [phone] Work Phone: [phone] Mobile Phone: [phone] Relation: Other Secondary Emergency Contact: riggins,joseph [place] Home Phone: [phone] Mobile Phone: [phone] Relation: Brother  Code Status:  DNR Goals of care: Advanced Directive information    08/15/2023    8:51 AM  Advanced Directives  Does Patient Have a Medical Advance Directive? Yes  Type of Estate agent of [place];Out of facility DNR (pink MOST or yellow form)  Does patient want to make changes to medical advance directive? No - Patient declined  Copy of Healthcare Power of Attorney in Chart? Yes - validated most recent copy scanned in chart (See row information)     Chief Complaint  Patient presents with   Acute Visit    Decline    HPI:  Pt is a 77 y.o. female seen today for an acute visit for Decline  Patient has had poor PO intake and increased confusion for the last 2 days. Of note, patient has had Zyprexa BID for the last few days.    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 +   Past Surgical History:  Procedure Laterality Date   ABDOMINAL HYSTERECTOMY     DUB, endometriosis no h/o abnormal pap    APPENDECTOMY     COLONOSCOPY N/A 04/30/2015   Procedure: COLONOSCOPY;  Surgeon: Elnita Maxwell, MD;  Location: [facility] ENDOSCOPY;  Service: Endoscopy;  Laterality: N/A;   TONSILLECTOMY       Allergies  Allergen Reactions   Amoxil [Amoxicillin]    Sulfa Antibiotics    Tylenol [Acetaminophen]     ?reaction     Ampicillin Rash   Elemental Sulfur Rash   Keflex [Cephalexin] Rash   Macrobid [Nitrofurantoin] Rash   Ofloxacin Rash   Penicillins Rash    Outpatient Encounter Medications as of 08/15/2023  Medication Sig   aluminum-magnesium hydroxide-simethicone (MAALOX) 200-200-20 MG/5ML SUSP Give 2 Tbsp by mouth every 4 hours as needed for gas, indigestion, or upset stomach Notify MD if no relief   Artificial Tears ophthalmic solution Place 1 drop into both eyes 3 (three) times daily as needed (For Dry Eyes).   diclofenac Sodium (VOLTAREN) 1 % GEL Apply 2 g topically 2 (two) times daily as needed.   donepezil (ARICEPT) 10 MG tablet Take 10 mg by mouth at bedtime.   ibuprofen (ADVIL) 400 MG tablet Take 400 mg by mouth 2 (two) times daily as needed.   mirtazapine (REMERON) 7.5 MG tablet Take 1 tablet (7.5 mg total) by mouth at bedtime.   OLANZapine (ZYPREXA) 2.5 MG tablet Take 2.5 mg by mouth 2 (two) times daily.   polyethylene glycol powder (GLYCOLAX/MIRALAX) 17 GM/SCOOP powder Take 1 Container by mouth every other day.   senna-docusate (SENOKOT-S) 8.6-50 MG tablet Take 1 tablet by mouth daily as needed for mild constipation.   sertraline (ZOLOFT) 100 MG tablet Take  100 mg by mouth daily.   traZODone (DESYREL) 50 MG tablet Take 25 mg by mouth 2 (two) times daily as needed for sleep.   [DISCONTINUED] loratadine (CLARITIN) 10 MG tablet Take 10 mg by mouth daily.   [DISCONTINUED] memantine (NAMENDA) 10 MG tablet Take 10 mg by mouth 2 (two) times daily.   [DISCONTINUED] pantoprazole (PROTONIX) 20 MG tablet Take 20 mg by mouth daily.   [DISCONTINUED] sertraline (ZOLOFT) 50 MG tablet Take 50 mg by mouth daily.   No facility-administered encounter medications on file as of 08/15/2023.    Review of Systems  Immunization History  Administered Date(s) Administered   Covid-19,  Mrna,Vaccine(Spikevax)81yrs and older 03/20/2023   Fluad Quad(high Dose 65+) 08/28/2019   Hep A / Hep B 09/03/2019   Influenza, High Dose Seasonal PF 09/09/2020, 08/28/2021, 09/27/2022   Influenza-Unspecified 09/11/2015   Moderna Covid-19 Vaccine Bivalent Booster 7yrs & up 05/09/2022   Moderna Sars-Covid-2 Vaccination 12/23/2019, 01/20/2020, 10/26/2020   Pfizer Covid-19 Vaccine Bivalent Booster 2yrs & up 09/02/2021   Pneumococcal Conjugate-13 11/23/2020   Pneumococcal Polysaccharide-23 09/11/2015   Tdap 12/12/2011   Zoster, Live 12/12/2003   Pertinent  Health Maintenance Due  Topic Date Due   INFLUENZA VACCINE  07/12/2023   DEXA SCAN  Completed   Colonoscopy  Discontinued      07/06/2021    3:17 PM 07/25/2021   11:35 AM 10/11/2021    3:26 PM 03/16/2023    8:22 PM 05/08/2023    8:16 AM  Fall Risk  Falls in the past year? 0 0 0 1 1  Was there an injury with Fall? 0  0 0 0  Fall Risk Category Calculator 0  0 2 2  Fall Risk Category (Retired) Low  Low    (RETIRED) Patient Fall Risk Level Low fall risk Low fall risk Low fall risk    Patient at Risk for Falls Due to   No Fall Risks History of fall(s);Impaired balance/gait;Impaired mobility History of fall(s);Impaired balance/gait;Impaired mobility  Fall risk Follow up Falls evaluation completed Falls evaluation completed Falls evaluation completed Falls evaluation completed Falls evaluation completed;Education provided   Functional Status Survey:    Vitals:   08/15/23 0836  BP: 126/77  Pulse: 72  Resp: 17  Temp: 98 F (36.7 C)  SpO2: 97%  Weight: 98 lb 9.6 oz (44.7 kg)  Height: 5' 5.5" (1.664 m)   Body mass index is 16.16 kg/m. Physical Exam Cardiovascular:     Rate and Rhythm: Normal rate.     Pulses: Normal pulses.  Pulmonary:     Effort: Pulmonary effort is normal.  Neurological:     Mental Status: She is alert. She is disoriented.    Labs reviewed: Recent Labs    12/07/22 0000 05/17/23 0000  NA 139 138  K  3.6 4.2  CL 103 104  CO2 28* 30*  BUN 33* 22*  CREATININE 1.1 0.8  CALCIUM 8.7 9.0   Recent Labs    12/07/22 0000 05/17/23 0000  AST 33 16  ALT 19 11  ALKPHOS 59 67  ALBUMIN 3.4* 3.6   Recent Labs    12/07/22 0000 05/17/23 0000  WBC 4.2 4.9  NEUTROABS 2,793.00 3,205.00  HGB 13.1 12.3  HCT 38 36  PLT 200 193   Lab Results  Component Value Date   TSH 1.79 09/17/2020   Lab Results  Component Value Date   HGBA1C 5.6 11/19/2018   Lab Results  Component Value Date   CHOL 188 10/16/2018  HDL 65.70 10/16/2018   LDLCALC 109 (H) 10/16/2018   TRIG 69.0 10/16/2018   CHOLHDL 3 10/16/2018    Significant Diagnostic Results in last 30 days:  No results found.  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 At this time will make Zyprexa PRN for behaviors rather than scheduled. Monitor for changes.  Family/ staff Communication: nursing  Labs/tests ordered:  none

## 2023-08-16 ENCOUNTER — Encounter: Payer: Self-pay | Admitting: Student

## 2023-08-23 ENCOUNTER — Encounter: Payer: Self-pay | Admitting: Nurse Practitioner

## 2023-08-23 ENCOUNTER — Non-Acute Institutional Stay: Payer: Medicare Other | Admitting: Nurse Practitioner

## 2023-08-23 DIAGNOSIS — F02B18 Dementia in other diseases classified elsewhere, moderate, with other behavioral disturbance: Secondary | ICD-10-CM | POA: Diagnosis not present

## 2023-08-23 DIAGNOSIS — R509 Fever, unspecified: Secondary | ICD-10-CM

## 2023-08-23 DIAGNOSIS — R0989 Other specified symptoms and signs involving the circulatory and respiratory systems: Secondary | ICD-10-CM | POA: Diagnosis not present

## 2023-08-23 NOTE — Progress Notes (Signed)
Location:  Other [place].  Nursing Home Room Number: [facility] 108P Place of Service:  ALF [phone]) Abbey Chatters, NP  PCP: Earnestine Mealing, MD  Patient Care Team: Earnestine Mealing, MD as PCP - General (Family Medicine)  Extended Emergency Contact Information Primary Emergency Contact: [facility] Phone: [phone] Work Phone: [phone] Mobile Phone: [phone] Relation: Other Secondary Emergency Contact: riggins,joseph [place] Home Phone: [phone] Mobile Phone: [phone] Relation: Brother  Goals of care: Advanced Directive information    08/23/2023   11:38 AM  Advanced Directives  Does Patient Have a Medical Advance Directive? Yes  Type of Estate agent of [place];Out of facility DNR (pink MOST or yellow form)  Does patient want to make changes to medical advance directive? No - Patient declined  Copy of Healthcare Power of Attorney in Chart? Yes - validated most recent copy scanned in chart (See row information)     Chief Complaint  Patient presents with   Acute Visit    Fever   HPI:  Pt is a 77 y.o. female seen today for an acute visit for elevated temperature.  Nursing reports yesterday pt had temp of 99.0, covid negative. Today temp was 100.3. repeat covid test still negative. She has hx of aspiration.  Over the last months she has had significant decline in weight, function and memory.  She is DNR and DNI Pt denies any pain and resting comfortable in bed at this time.    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 +   Past Surgical History:  Procedure Laterality Date   ABDOMINAL HYSTERECTOMY     DUB, endometriosis no h/o abnormal pap     APPENDECTOMY     COLONOSCOPY N/A 04/30/2015   Procedure: COLONOSCOPY;  Surgeon: Elnita Maxwell, MD;  Location: [facility] ENDOSCOPY;  Service: Endoscopy;  Laterality: N/A;   TONSILLECTOMY      Allergies  Allergen Reactions   Amoxil [Amoxicillin]    Sulfa Antibiotics    Tylenol [Acetaminophen]     ?reaction     Ampicillin Rash   Elemental Sulfur Rash   Keflex [Cephalexin] Rash   Macrobid [Nitrofurantoin] Rash   Ofloxacin Rash   Penicillins Rash    Outpatient Encounter Medications as of 08/23/2023  Medication Sig   Artificial Tears ophthalmic solution Place 1 drop into both eyes 3 (three) times daily as needed (For Dry Eyes).   diclofenac Sodium (VOLTAREN) 1 % GEL Apply 2 g topically 2 (two) times daily as needed.   donepezil (ARICEPT) 10 MG tablet Take 10 mg by mouth at bedtime.   ibuprofen (ADVIL) 400 MG tablet Take 400 mg by mouth 2 (two) times daily as needed.   mirtazapine (REMERON) 7.5 MG tablet Take 1 tablet (7.5 mg total) by mouth at bedtime.   OLANZapine (ZYPREXA) 2.5 MG tablet Take 2.5 mg by mouth 2 (two) times daily.   polyethylene glycol powder (GLYCOLAX/MIRALAX) 17 GM/SCOOP powder Take 1 Container by mouth every other day.   sertraline (ZOLOFT) 100 MG tablet Take 100 mg by mouth daily.   [DISCONTINUED] aluminum-magnesium hydroxide-simethicone (MAALOX) 200-200-20 MG/5ML SUSP Give 2 Tbsp by  mouth every 4 hours as needed for gas, indigestion, or upset stomach Notify MD if no relief   [DISCONTINUED] senna-docusate (SENOKOT-S) 8.6-50 MG tablet Take 1 tablet by mouth daily as needed for mild constipation.   [DISCONTINUED] traZODone (DESYREL) 50 MG tablet Take 25 mg by mouth 2 (two) times daily as needed for sleep.   No facility-administered encounter medications on file as of 08/23/2023.    Review of Systems  Unable to perform ROS: Dementia    Immunization History  Administered Date(s) Administered   Covid-19, Mrna,Vaccine(Spikevax)28yrs and older 03/20/2023   Fluad  Quad(high Dose 65+) 08/28/2019   Hep A / Hep B 09/03/2019   Influenza, High Dose Seasonal PF 09/09/2020, 08/28/2021, 09/27/2022   Influenza-Unspecified 09/11/2015   Moderna Covid-19 Vaccine Bivalent Booster 39yrs & up 05/09/2022   Moderna Sars-Covid-2 Vaccination 12/23/2019, 01/20/2020, 10/26/2020   Pfizer Covid-19 Vaccine Bivalent Booster 31yrs & up 09/02/2021   Pneumococcal Conjugate-13 11/23/2020   Pneumococcal Polysaccharide-23 09/11/2015   Tdap 12/12/2011, 06/14/2023   Zoster Recombinant(Shingrix) 05/15/2023, 08/16/2023   Zoster, Live 12/12/2003   Pertinent  Health Maintenance Due  Topic Date Due   INFLUENZA VACCINE  07/12/2023   DEXA SCAN  Completed   Colonoscopy  Discontinued      07/06/2021    3:17 PM 07/25/2021   11:35 AM 10/11/2021    3:26 PM 03/16/2023    8:22 PM 05/08/2023    8:16 AM  Fall Risk  Falls in the past year? 0 0 0 1 1  Was there an injury with Fall? 0  0 0 0  Fall Risk Category Calculator 0  0 2 2  Fall Risk Category (Retired) Low  Low    (RETIRED) Patient Fall Risk Level Low fall risk Low fall risk Low fall risk    Patient at Risk for Falls Due to   No Fall Risks History of fall(s);Impaired balance/gait;Impaired mobility History of fall(s);Impaired balance/gait;Impaired mobility  Fall risk Follow up Falls evaluation completed Falls evaluation completed Falls evaluation completed Falls evaluation completed Falls evaluation completed;Education provided   Functional Status Survey:    Vitals:   08/23/23 1131  BP: 122/78  Pulse: (!) 112  Resp: 20  Temp: 98 F (36.7 C)  SpO2: 97%  Weight: 92 lb 3.2 oz (41.8 kg)  Height: 5' 5.5" (1.664 m)   Body mass index is 15.11 kg/m. Physical Exam Constitutional:      General: She is not in acute distress.    Appearance: She is cachectic. She is not diaphoretic.  HENT:     Head: Normocephalic and atraumatic.     Mouth/Throat:     Pharynx: No oropharyngeal exudate.  Eyes:     Conjunctiva/sclera: Conjunctivae  normal.     Pupils: Pupils are equal, round, and reactive to light.  Cardiovascular:     Rate and Rhythm: Normal rate and regular rhythm.     Heart sounds: Normal heart sounds.  Pulmonary:     Effort: Pulmonary effort is normal.     Breath sounds: Normal breath sounds.  Abdominal:     General: Bowel sounds are normal.     Palpations: Abdomen is soft.  Musculoskeletal:     Cervical back: Normal range of motion and neck supple.     Right lower leg: No edema.     Left lower leg: No edema.  Skin:    General: Skin is warm and dry.  Neurological:     Mental Status: She is lethargic.  Psychiatric:  Mood and Affect: Mood normal.     Labs reviewed: Recent Labs    12/07/22 0000 05/17/23 0000  NA 139 138  K 3.6 4.2  CL 103 104  CO2 28* 30*  BUN 33* 22*  CREATININE 1.1 0.8  CALCIUM 8.7 9.0   Recent Labs    12/07/22 0000 05/17/23 0000  AST 33 16  ALT 19 11  ALKPHOS 59 67  ALBUMIN 3.4* 3.6   Recent Labs    12/07/22 0000 05/17/23 0000  WBC 4.2 4.9  NEUTROABS 2,793.00 3,205.00  HGB 13.1 12.3  HCT 38 36  PLT 200 193   Lab Results  Component Value Date   TSH 1.79 09/17/2020   Lab Results  Component Value Date   HGBA1C 5.6 11/19/2018   Lab Results  Component Value Date   CHOL 188 10/16/2018   HDL 65.70 10/16/2018   LDLCALC 109 (H) 10/16/2018   TRIG 69.0 10/16/2018   CHOLHDL 3 10/16/2018    Significant Diagnostic Results in last 30 days:  No results found.  Assessment/Plan 1. Fever, unspecified fever cause VSS, and in NAD, she has hx of aspiration will get chest xray, cbc with diff, bmp, UA C&S at this time to evaluate.   2. 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  Continue supportive care.   Janene Harvey. Biagio Borg [facility] [phone]

## 2023-08-24 ENCOUNTER — Non-Acute Institutional Stay: Payer: Medicare Other | Admitting: Student

## 2023-08-24 ENCOUNTER — Encounter: Payer: Self-pay | Admitting: Student

## 2023-08-24 DIAGNOSIS — Z515 Encounter for palliative care: Secondary | ICD-10-CM

## 2023-08-24 DIAGNOSIS — G309 Alzheimer's disease, unspecified: Secondary | ICD-10-CM

## 2023-08-24 DIAGNOSIS — F02C11 Dementia in other diseases classified elsewhere, severe, with agitation: Secondary | ICD-10-CM | POA: Diagnosis not present

## 2023-08-24 NOTE — Progress Notes (Unsigned)
Location:  [facility] [place].  Nursing Home Room Number: [facility] 108P Place of Service:  ALF [phone]) Provider:  Earnestine Mealing, MD  Patient Care Team: Earnestine Mealing, MD as PCP - General (Family Medicine)  Extended Emergency Contact Information Primary Emergency Contact: [facility] Phone: [phone] Work Phone: [phone] Mobile Phone: [phone] Relation: Other Secondary Emergency Contact: riggins,joseph [place] Home Phone: [phone] Mobile Phone: [phone] Relation: Brother  Code Status:  DNR Goals of care: Advanced Directive information    08/24/2023    1:57 PM  Advanced Directives  Does Patient Have a Medical Advance Directive? Yes  Type of Estate agent of [place];Out of facility DNR (pink MOST or yellow form)  Does patient want to make changes to medical advance directive? No - Patient declined  Copy of Healthcare Power of Attorney in Chart? Yes - validated most recent copy scanned in chart (See row information)     Chief Complaint  Patient presents with   Acute Visit    Hospice Care.     HPI:  Pt is a 77 y.o. female seen today for an acute visit for Hospice Care.   Patient has had signifcant decline per nursing. Not communicating at this time.   Patient has not eaten for a few days. Labs notable for sodium of 150.   Contacted patient's brother and HCPOA for concern that she is nearing end of life. Declines hospice, but does want comfort measures only.    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 +   Past Surgical History:  Procedure Laterality Date   ABDOMINAL HYSTERECTOMY     DUB, endometriosis no h/o abnormal pap    APPENDECTOMY      COLONOSCOPY N/A 04/30/2015   Procedure: COLONOSCOPY;  Surgeon: Elnita Maxwell, MD;  Location: [facility] ENDOSCOPY;  Service: Endoscopy;  Laterality: N/A;   TONSILLECTOMY      Allergies  Allergen Reactions   Amoxil [Amoxicillin]    Sulfa Antibiotics    Tylenol [Acetaminophen]     ?reaction     Ampicillin Rash   Elemental Sulfur Rash   Keflex [Cephalexin] Rash   Macrobid [Nitrofurantoin] Rash   Ofloxacin Rash   Penicillins Rash    Outpatient Encounter Medications as of 08/24/2023  Medication Sig   Artificial Tears ophthalmic solution Place 1 drop into both eyes 3 (three) times daily as needed (For Dry Eyes).   diclofenac Sodium (VOLTAREN) 1 % GEL Apply 2 g topically 2 (two) times daily as needed.   donepezil (ARICEPT) 10 MG tablet Take 10 mg by mouth at bedtime.   ibuprofen (ADVIL) 400 MG tablet Take 400 mg by mouth 2 (two) times daily as needed.   LORazepam (ATIVAN) 0.5 MG tablet Take 0.5 mg by mouth every 8 (eight) hours as needed for anxiety.   mirtazapine (REMERON) 7.5 MG tablet Take 1 tablet (7.5 mg total) by mouth at bedtime.   morphine 20 MG/5ML solution Give 0.25 ml by mouth every 4 hours as needed for SOB, Air Hunger, Pain.   OLANZapine (ZYPREXA) 2.5 MG tablet Take 2.5 mg by mouth daily as needed.  polyethylene glycol powder (GLYCOLAX/MIRALAX) 17 GM/SCOOP powder Take 1 Container by mouth every other day.   sertraline (ZOLOFT) 100 MG tablet Take 100 mg by mouth daily.   No facility-administered encounter medications on file as of 08/24/2023.    Review of Systems  Immunization History  Administered Date(s) Administered   Covid-19, Mrna,Vaccine(Spikevax)49yrs and older 03/20/2023   Fluad Quad(high Dose 65+) 08/28/2019   Hep A / Hep B 09/03/2019   Influenza, High Dose Seasonal PF 09/09/2020, 08/28/2021, 09/27/2022   Influenza-Unspecified 09/11/2015   Moderna Covid-19 Vaccine Bivalent Booster 56yrs & up 05/09/2022   Moderna Sars-Covid-2 Vaccination 12/23/2019,  01/20/2020, 10/26/2020   Pfizer Covid-19 Vaccine Bivalent Booster 41yrs & up 09/02/2021   Pneumococcal Conjugate-13 11/23/2020   Pneumococcal Polysaccharide-23 09/11/2015   Tdap 12/12/2011, 06/14/2023   Zoster Recombinant(Shingrix) 05/15/2023, 08/16/2023   Zoster, Live 12/12/2003   Pertinent  Health Maintenance Due  Topic Date Due   INFLUENZA VACCINE  07/12/2023   DEXA SCAN  Completed   Colonoscopy  Discontinued      07/06/2021    3:17 PM 07/25/2021   11:35 AM 10/11/2021    3:26 PM 03/16/2023    8:22 PM 05/08/2023    8:16 AM  Fall Risk  Falls in the past year? 0 0 0 1 1  Was there an injury with Fall? 0  0 0 0  Fall Risk Category Calculator 0  0 2 2  Fall Risk Category (Retired) Low  Low    (RETIRED) Patient Fall Risk Level Low fall risk Low fall risk Low fall risk    Patient at Risk for Falls Due to   No Fall Risks History of fall(s);Impaired balance/gait;Impaired mobility History of fall(s);Impaired balance/gait;Impaired mobility  Fall risk Follow up Falls evaluation completed Falls evaluation completed Falls evaluation completed Falls evaluation completed Falls evaluation completed;Education provided   Functional Status Survey:    Vitals:   08/24/23 1351  BP: 122/78  Pulse: (!) 112  Resp: 20  Temp: 98 F (36.7 C)  SpO2: 97%  Weight: 92 lb 3.2 oz (41.8 kg)  Height: 5' 5.5" (1.664 m)   Body mass index is 15.11 kg/m. Physical Exam Constitutional:      Comments: Obtunded, diaphoretic  Cardiovascular:     Rate and Rhythm: Normal rate.     Pulses: Normal pulses.  Abdominal:     Palpations: Abdomen is soft.     Labs reviewed: Recent Labs    12/07/22 0000 05/17/23 0000  NA 139 138  K 3.6 4.2  CL 103 104  CO2 28* 30*  BUN 33* 22*  CREATININE 1.1 0.8  CALCIUM 8.7 9.0   Recent Labs    12/07/22 0000 05/17/23 0000  AST 33 16  ALT 19 11  ALKPHOS 59 67  ALBUMIN 3.4* 3.6   Recent Labs    12/07/22 0000 05/17/23 0000  WBC 4.2 4.9  NEUTROABS 2,793.00  3,205.00  HGB 13.1 12.3  HCT 38 36  PLT 200 193   Lab Results  Component Value Date   TSH 1.79 09/17/2020   Lab Results  Component Value Date   HGBA1C 5.6 11/19/2018   Lab Results  Component Value Date   CHOL 188 10/16/2018   HDL 65.70 10/16/2018   LDLCALC 109 (H) 10/16/2018   TRIG 69.0 10/16/2018   CHOLHDL 3 10/16/2018    Significant Diagnostic Results in last 30 days:  No results found.  Assessment/Plan Severe Alzheimer's dementia with agitation, unspecified timing of dementia onset [facility]) Patient is now in  end stage dementia, will plan for transition to comfort care at this time. Start comfort medications and discontinue routine medications. Morphine 5mg  q4 PRN for shortness of breath, anxiety, or air hunger, ativan 0.5mg  q8 prn for anxiety and agitation.   Family/ staff Communication: Brother, nursing  Labs/tests ordered:  none   I spent greater than 15  minutes for the care of this patient in face to face time, chart review, clinical documentation, patient education. I spent an additional 16 minutes discussing goals of care and advanced care planning.

## 2023-08-25 MED ORDER — LORAZEPAM 0.5 MG PO TABS
0.5000 mg | ORAL_TABLET | Freq: Three times a day (TID) | ORAL | 0 refills | Status: DC | PRN
Start: 2023-08-25 — End: 2023-10-02

## 2023-08-25 MED ORDER — MORPHINE SULFATE (CONCENTRATE) 20 MG/ML PO SOLN
5.0000 mg | ORAL | 0 refills | Status: DC | PRN
Start: 2023-08-25 — End: 2023-10-02

## 2023-09-11 DEATH — deceased
